# Patient Record
Sex: Female | Born: 1950 | Race: White | Hispanic: No | Marital: Single | Smoking: Never smoker
Health system: Southern US, Community
[De-identification: ages and names within clinical notes are randomized; demographics above are authoritative.]

## PROBLEM LIST (undated history)

## (undated) DIAGNOSIS — R112 Nausea with vomiting, unspecified: Secondary | ICD-10-CM

## (undated) DIAGNOSIS — E119 Type 2 diabetes mellitus without complications: Secondary | ICD-10-CM

## (undated) DIAGNOSIS — J189 Pneumonia, unspecified organism: Secondary | ICD-10-CM

## (undated) DIAGNOSIS — T7840XA Allergy, unspecified, initial encounter: Secondary | ICD-10-CM

## (undated) DIAGNOSIS — L409 Psoriasis, unspecified: Secondary | ICD-10-CM

## (undated) DIAGNOSIS — T8859XA Other complications of anesthesia, initial encounter: Secondary | ICD-10-CM

## (undated) DIAGNOSIS — I10 Essential (primary) hypertension: Secondary | ICD-10-CM

## (undated) DIAGNOSIS — G4730 Sleep apnea, unspecified: Secondary | ICD-10-CM

## (undated) DIAGNOSIS — E079 Disorder of thyroid, unspecified: Secondary | ICD-10-CM

## (undated) DIAGNOSIS — M1990 Unspecified osteoarthritis, unspecified site: Secondary | ICD-10-CM

## (undated) DIAGNOSIS — E669 Obesity, unspecified: Secondary | ICD-10-CM

## (undated) DIAGNOSIS — Z8669 Personal history of other diseases of the nervous system and sense organs: Secondary | ICD-10-CM

## (undated) DIAGNOSIS — G43909 Migraine, unspecified, not intractable, without status migrainosus: Secondary | ICD-10-CM

## (undated) DIAGNOSIS — G8929 Other chronic pain: Secondary | ICD-10-CM

## (undated) DIAGNOSIS — F32A Depression, unspecified: Secondary | ICD-10-CM

## (undated) DIAGNOSIS — F329 Major depressive disorder, single episode, unspecified: Secondary | ICD-10-CM

## (undated) DIAGNOSIS — E039 Hypothyroidism, unspecified: Secondary | ICD-10-CM

## (undated) HISTORY — PX: CARPAL TUNNEL RELEASE: SHX101

## (undated) HISTORY — DX: Depression, unspecified: F32.A

## (undated) HISTORY — DX: Allergy, unspecified, initial encounter: T78.40XA

## (undated) HISTORY — PX: CERVICAL FUSION: SHX112

## (undated) HISTORY — PX: FRACTURE SURGERY: SHX138

## (undated) HISTORY — DX: Disorder of thyroid, unspecified: E07.9

## (undated) HISTORY — PX: COLONOSCOPY: SHX174

## (undated) HISTORY — PX: UPPER GI ENDOSCOPY: SHX6162

## (undated) HISTORY — DX: Essential (primary) hypertension: I10

## (undated) HISTORY — PX: ABDOMINAL HYSTERECTOMY: SHX81

## (undated) HISTORY — DX: Major depressive disorder, single episode, unspecified: F32.9

---

## 1997-12-20 ENCOUNTER — Ambulatory Visit (HOSPITAL_COMMUNITY): Admission: RE | Admit: 1997-12-20 | Discharge: 1997-12-20 | Payer: Self-pay | Admitting: Obstetrics and Gynecology

## 1998-05-21 ENCOUNTER — Inpatient Hospital Stay (HOSPITAL_COMMUNITY): Admission: RE | Admit: 1998-05-21 | Discharge: 1998-05-22 | Payer: Self-pay | Admitting: Neurological Surgery

## 1998-05-21 ENCOUNTER — Encounter: Payer: Self-pay | Admitting: Neurological Surgery

## 1998-07-15 ENCOUNTER — Other Ambulatory Visit: Admission: RE | Admit: 1998-07-15 | Discharge: 1998-07-15 | Payer: Self-pay | Admitting: Obstetrics & Gynecology

## 1998-11-29 ENCOUNTER — Encounter: Admission: RE | Admit: 1998-11-29 | Discharge: 1998-12-13 | Payer: Self-pay | Admitting: Neurological Surgery

## 1998-12-01 ENCOUNTER — Encounter: Payer: Self-pay | Admitting: Neurological Surgery

## 1998-12-01 ENCOUNTER — Ambulatory Visit (HOSPITAL_COMMUNITY): Admission: RE | Admit: 1998-12-01 | Discharge: 1998-12-01 | Payer: Self-pay | Admitting: Neurological Surgery

## 1999-01-04 ENCOUNTER — Inpatient Hospital Stay (HOSPITAL_COMMUNITY): Admission: RE | Admit: 1999-01-04 | Discharge: 1999-01-09 | Payer: Self-pay | Admitting: Orthopedic Surgery

## 1999-01-06 ENCOUNTER — Encounter: Payer: Self-pay | Admitting: Orthopedic Surgery

## 1999-01-08 ENCOUNTER — Encounter: Payer: Self-pay | Admitting: Orthopedic Surgery

## 1999-09-23 ENCOUNTER — Ambulatory Visit (HOSPITAL_BASED_OUTPATIENT_CLINIC_OR_DEPARTMENT_OTHER): Admission: RE | Admit: 1999-09-23 | Discharge: 1999-09-23 | Payer: Self-pay | Admitting: Orthopedic Surgery

## 1999-09-30 ENCOUNTER — Other Ambulatory Visit: Admission: RE | Admit: 1999-09-30 | Discharge: 1999-09-30 | Payer: Self-pay | Admitting: Obstetrics & Gynecology

## 1999-10-02 ENCOUNTER — Encounter: Admission: RE | Admit: 1999-10-02 | Discharge: 1999-11-26 | Payer: Self-pay | Admitting: Orthopedic Surgery

## 2000-01-08 ENCOUNTER — Encounter: Admission: RE | Admit: 2000-01-08 | Discharge: 2000-01-08 | Payer: Self-pay | Admitting: Obstetrics & Gynecology

## 2000-01-08 ENCOUNTER — Encounter: Payer: Self-pay | Admitting: Obstetrics & Gynecology

## 2000-09-06 ENCOUNTER — Ambulatory Visit (HOSPITAL_BASED_OUTPATIENT_CLINIC_OR_DEPARTMENT_OTHER): Admission: RE | Admit: 2000-09-06 | Discharge: 2000-09-06 | Payer: Self-pay | Admitting: Orthopedic Surgery

## 2000-11-11 ENCOUNTER — Encounter: Admission: RE | Admit: 2000-11-11 | Discharge: 2000-11-11 | Payer: Self-pay | Admitting: Neurological Surgery

## 2000-11-11 ENCOUNTER — Encounter: Payer: Self-pay | Admitting: Neurological Surgery

## 2001-07-08 ENCOUNTER — Encounter: Admission: RE | Admit: 2001-07-08 | Discharge: 2001-07-08 | Payer: Self-pay | Admitting: Neurological Surgery

## 2001-07-08 ENCOUNTER — Encounter: Payer: Self-pay | Admitting: Neurological Surgery

## 2001-07-11 ENCOUNTER — Encounter: Admission: RE | Admit: 2001-07-11 | Discharge: 2001-10-09 | Payer: Self-pay | Admitting: Neurological Surgery

## 2002-03-15 ENCOUNTER — Encounter: Admission: RE | Admit: 2002-03-15 | Discharge: 2002-03-15 | Payer: Self-pay | Admitting: Family Medicine

## 2002-05-26 ENCOUNTER — Encounter: Admission: RE | Admit: 2002-05-26 | Discharge: 2002-05-26 | Payer: Self-pay | Admitting: Obstetrics and Gynecology

## 2002-05-26 ENCOUNTER — Encounter: Payer: Self-pay | Admitting: Obstetrics and Gynecology

## 2002-06-22 ENCOUNTER — Ambulatory Visit (HOSPITAL_BASED_OUTPATIENT_CLINIC_OR_DEPARTMENT_OTHER): Admission: RE | Admit: 2002-06-22 | Discharge: 2002-06-22 | Payer: Self-pay | Admitting: Internal Medicine

## 2002-11-03 ENCOUNTER — Other Ambulatory Visit: Admission: RE | Admit: 2002-11-03 | Discharge: 2002-11-03 | Payer: Self-pay | Admitting: Internal Medicine

## 2003-11-26 ENCOUNTER — Encounter: Admission: RE | Admit: 2003-11-26 | Discharge: 2003-11-26 | Payer: Self-pay | Admitting: Obstetrics & Gynecology

## 2004-07-23 ENCOUNTER — Ambulatory Visit: Payer: Self-pay | Admitting: Internal Medicine

## 2004-08-08 ENCOUNTER — Ambulatory Visit: Payer: Self-pay | Admitting: Internal Medicine

## 2004-09-02 ENCOUNTER — Encounter: Admission: RE | Admit: 2004-09-02 | Discharge: 2004-09-02 | Payer: Self-pay | Admitting: Obstetrics and Gynecology

## 2004-11-03 ENCOUNTER — Ambulatory Visit: Payer: Self-pay | Admitting: Internal Medicine

## 2005-07-17 ENCOUNTER — Encounter: Admission: RE | Admit: 2005-07-17 | Discharge: 2005-07-17 | Payer: Self-pay | Admitting: Obstetrics and Gynecology

## 2005-08-03 ENCOUNTER — Encounter: Admission: RE | Admit: 2005-08-03 | Discharge: 2005-08-03 | Payer: Self-pay | Admitting: Obstetrics and Gynecology

## 2005-10-05 ENCOUNTER — Ambulatory Visit: Payer: Self-pay | Admitting: Internal Medicine

## 2005-10-09 ENCOUNTER — Ambulatory Visit: Payer: Self-pay | Admitting: Internal Medicine

## 2005-10-26 ENCOUNTER — Ambulatory Visit: Payer: Self-pay | Admitting: Internal Medicine

## 2005-11-30 ENCOUNTER — Ambulatory Visit: Payer: Self-pay | Admitting: Internal Medicine

## 2005-12-07 ENCOUNTER — Ambulatory Visit: Payer: Self-pay | Admitting: Internal Medicine

## 2006-05-10 ENCOUNTER — Ambulatory Visit: Payer: Self-pay | Admitting: Internal Medicine

## 2006-05-17 ENCOUNTER — Ambulatory Visit: Payer: Self-pay | Admitting: Internal Medicine

## 2006-08-16 ENCOUNTER — Ambulatory Visit: Payer: Self-pay | Admitting: Internal Medicine

## 2006-08-16 LAB — CONVERTED CEMR LAB: TSH: 0.31 microintl units/mL — ABNORMAL LOW (ref 0.35–5.50)

## 2006-09-28 ENCOUNTER — Encounter: Admission: RE | Admit: 2006-09-28 | Discharge: 2006-09-28 | Payer: Self-pay | Admitting: Obstetrics and Gynecology

## 2006-10-11 ENCOUNTER — Ambulatory Visit: Payer: Self-pay | Admitting: Internal Medicine

## 2007-05-11 DIAGNOSIS — J45909 Unspecified asthma, uncomplicated: Secondary | ICD-10-CM | POA: Insufficient documentation

## 2007-05-11 DIAGNOSIS — E669 Obesity, unspecified: Secondary | ICD-10-CM | POA: Insufficient documentation

## 2007-05-11 DIAGNOSIS — R32 Unspecified urinary incontinence: Secondary | ICD-10-CM | POA: Insufficient documentation

## 2007-05-11 DIAGNOSIS — F3289 Other specified depressive episodes: Secondary | ICD-10-CM | POA: Insufficient documentation

## 2007-05-11 DIAGNOSIS — E1169 Type 2 diabetes mellitus with other specified complication: Secondary | ICD-10-CM | POA: Insufficient documentation

## 2007-05-11 DIAGNOSIS — G43909 Migraine, unspecified, not intractable, without status migrainosus: Secondary | ICD-10-CM | POA: Insufficient documentation

## 2007-05-11 DIAGNOSIS — E785 Hyperlipidemia, unspecified: Secondary | ICD-10-CM | POA: Insufficient documentation

## 2007-05-11 DIAGNOSIS — F329 Major depressive disorder, single episode, unspecified: Secondary | ICD-10-CM | POA: Insufficient documentation

## 2008-04-11 ENCOUNTER — Encounter: Payer: Self-pay | Admitting: Internal Medicine

## 2009-07-10 ENCOUNTER — Encounter (INDEPENDENT_AMBULATORY_CARE_PROVIDER_SITE_OTHER): Payer: Self-pay | Admitting: *Deleted

## 2009-09-09 ENCOUNTER — Encounter (INDEPENDENT_AMBULATORY_CARE_PROVIDER_SITE_OTHER): Payer: Self-pay | Admitting: *Deleted

## 2010-05-16 ENCOUNTER — Telehealth: Payer: Self-pay | Admitting: Internal Medicine

## 2010-09-30 NOTE — Progress Notes (Signed)
Summary: Schedule Colonoscopy   Phone Note Outgoing Call Call back at [facility] Phone [phone]   Call placed by: Harlow Mares CMA Duncan Dull),  May 16, 2010 2:06 PM Call placed to: Patient Summary of Call: Left a message on patients machine to call back, patient is due for a colonoscopy due to history of polyps. Initial call taken by: Harlow Mares CMA Duncan Dull),  May 16, 2010 2:06 PM  Follow-up for Phone Call        patients husband says the pt got the first message and he does not know when she is going to call back.  Follow-up by: Harlow Mares CMA Duncan Dull),  May 26, 2010 2:48 PM

## 2010-09-30 NOTE — Consult Note (Signed)
Summary: [facility]  [facility] [place]   Imported By: Maryln Gottron 05/04/2008 15:16:38  _____________________________________________________________________  External Attachment:    Type:   Image     Comment:   External Document

## 2010-09-30 NOTE — Letter (Signed)
Summary: [facility]  [facility] [employer] [facility] [place] Provider: This provider was preselected by the workflow.  Signature: The signature status of this document was preset by the workflow  Processed by [employer] Local Indexer Client @ Thursday, July 18, 2009 3:36:31 PM using version:2010.1.2.11(2.4)   Manually Indexed By: [id number]  idlBatchDetail: [id number]   _____________________________________________________________________  External Attachment:    Type:   Image     Comment:   External Document

## 2010-09-30 NOTE — Letter (Signed)
Summary: Colonoscopy Letter  [facility]  [address] [place], [place] [address]   Phone: [phone]  Fax: [phone]      September 09, 2009 MRN: [id number]   EMONNI DEPASQUALE [address] [place], [place]  [address]   Dear Ms. Travaglini,   According to your medical record, it is time for you to schedule a Colonoscopy. The American Cancer Society recommends this procedure as a method to detect early colon cancer. Patients with a family history of colon cancer, or a personal history of colon polyps or inflammatory bowel disease are at increased risk.  This letter has beeen generated based on the recommendations made at the time of your procedure. If you feel that in your particular situation this may no longer apply, please contact our office.  Please call our office at [phone] to schedule this appointment or to update your records at your earliest convenience.  Thank you for cooperating with [place] to provide you with the very best care possible.   Sincerely,  Hedwig Morton. Juanda Chance, M.D.  [facility] Gastroenterology Division [phone]

## 2013-01-27 ENCOUNTER — Encounter: Payer: Self-pay | Admitting: Internal Medicine

## 2013-01-30 ENCOUNTER — Encounter: Payer: Self-pay | Admitting: Gastroenterology

## 2013-01-30 ENCOUNTER — Encounter: Payer: Self-pay | Admitting: Internal Medicine

## 2015-07-08 DIAGNOSIS — F411 Generalized anxiety disorder: Secondary | ICD-10-CM | POA: Diagnosis not present

## 2015-07-08 DIAGNOSIS — I10 Essential (primary) hypertension: Secondary | ICD-10-CM | POA: Diagnosis not present

## 2015-07-08 DIAGNOSIS — H6121 Impacted cerumen, right ear: Secondary | ICD-10-CM | POA: Diagnosis not present

## 2015-07-08 DIAGNOSIS — Z23 Encounter for immunization: Secondary | ICD-10-CM | POA: Diagnosis not present

## 2015-07-08 DIAGNOSIS — T7840XD Allergy, unspecified, subsequent encounter: Secondary | ICD-10-CM | POA: Diagnosis not present

## 2015-07-08 DIAGNOSIS — I493 Ventricular premature depolarization: Secondary | ICD-10-CM | POA: Diagnosis not present

## 2015-07-11 ENCOUNTER — Ambulatory Visit (INDEPENDENT_AMBULATORY_CARE_PROVIDER_SITE_OTHER): Payer: Medicare Other | Admitting: Family Medicine

## 2015-07-11 DIAGNOSIS — S161XXA Strain of muscle, fascia and tendon at neck level, initial encounter: Secondary | ICD-10-CM | POA: Diagnosis not present

## 2015-07-11 DIAGNOSIS — M25511 Pain in right shoulder: Secondary | ICD-10-CM

## 2015-07-11 MED ORDER — METHOCARBAMOL 500 MG PO TABS: ORAL_TABLET | ORAL | Status: DC
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
## 2015-07-11 NOTE — Patient Instructions (Signed)
Take Robaxin (methocarbamol) 500 mg one 3 times daily with meals and 2 at bedtime on an as-needed basis. Muscle relaxant  Take naproxen (Aleve) 220 mg 2 pills twice daily for pain and inflammation or ibuprofen 200 mg 4 pills 3 times daily as needed for pain and inflammation  Return as needed  Apply ice as you have been to the affected areas  Stay off work tomorrow and rest

## 2015-08-15 DIAGNOSIS — Z6833 Body mass index (BMI) 33.0-33.9, adult: Secondary | ICD-10-CM | POA: Diagnosis not present

## 2015-08-15 DIAGNOSIS — I10 Essential (primary) hypertension: Secondary | ICD-10-CM | POA: Diagnosis not present

## 2015-08-19 DIAGNOSIS — Z124 Encounter for screening for malignant neoplasm of cervix: Secondary | ICD-10-CM | POA: Diagnosis not present

## 2015-08-19 DIAGNOSIS — Z779 Other contact with and (suspected) exposures hazardous to health: Secondary | ICD-10-CM | POA: Diagnosis not present

## 2015-08-19 DIAGNOSIS — Z6834 Body mass index (BMI) 34.0-34.9, adult: Secondary | ICD-10-CM | POA: Diagnosis not present

## 2015-09-09 DIAGNOSIS — E039 Hypothyroidism, unspecified: Secondary | ICD-10-CM | POA: Diagnosis not present

## 2015-09-09 DIAGNOSIS — I10 Essential (primary) hypertension: Secondary | ICD-10-CM | POA: Diagnosis not present

## 2015-09-09 DIAGNOSIS — F411 Generalized anxiety disorder: Secondary | ICD-10-CM | POA: Diagnosis not present

## 2015-09-20 ENCOUNTER — Other Ambulatory Visit: Payer: Self-pay | Admitting: Nurse Practitioner

## 2015-09-20 DIAGNOSIS — N6320 Unspecified lump in the left breast, unspecified quadrant: Secondary | ICD-10-CM

## 2015-09-20 DIAGNOSIS — Z13820 Encounter for screening for osteoporosis: Secondary | ICD-10-CM | POA: Diagnosis not present

## 2015-09-20 DIAGNOSIS — N958 Other specified menopausal and perimenopausal disorders: Secondary | ICD-10-CM | POA: Diagnosis not present

## 2015-09-22 DIAGNOSIS — J209 Acute bronchitis, unspecified: Secondary | ICD-10-CM | POA: Diagnosis not present

## 2015-09-24 ENCOUNTER — Telehealth: Payer: Self-pay | Admitting: Internal Medicine

## 2015-09-24 ENCOUNTER — Other Ambulatory Visit: Payer: Self-pay | Admitting: Family Medicine

## 2015-09-24 ENCOUNTER — Ambulatory Visit
Admission: RE | Admit: 2015-09-24 | Discharge: 2015-09-24 | Disposition: A | Discharge status: Home / Self Care | Payer: Medicare Other | Source: Ambulatory Visit | Attending: Family Medicine | Admitting: Family Medicine

## 2015-09-24 DIAGNOSIS — J45909 Unspecified asthma, uncomplicated: Secondary | ICD-10-CM

## 2015-09-24 DIAGNOSIS — J069 Acute upper respiratory infection, unspecified: Secondary | ICD-10-CM | POA: Diagnosis not present

## 2015-09-24 DIAGNOSIS — J45901 Unspecified asthma with (acute) exacerbation: Secondary | ICD-10-CM | POA: Diagnosis not present

## 2015-09-24 DIAGNOSIS — R0602 Shortness of breath: Secondary | ICD-10-CM

## 2015-09-24 DIAGNOSIS — R05 Cough: Secondary | ICD-10-CM | POA: Diagnosis not present

## 2015-09-24 NOTE — Telephone Encounter (Signed)
Spoke with pt. States that she thinks she is getting bronchitis. We have not seen this pt since 2007. Advised her that since we have not seen her in 10 years that she should contact her PCP about this issue. She agreed and verbalized understanding. She did request for [place] to schedule an appointment to re-establish care. This has been scheduled with MW on 10/15/15 at 4pm. Nothing further was needed.

## 2015-10-01 DIAGNOSIS — T7840XD Allergy, unspecified, subsequent encounter: Secondary | ICD-10-CM | POA: Diagnosis not present

## 2015-10-01 DIAGNOSIS — J45901 Unspecified asthma with (acute) exacerbation: Secondary | ICD-10-CM | POA: Diagnosis not present

## 2015-10-01 DIAGNOSIS — E039 Hypothyroidism, unspecified: Secondary | ICD-10-CM | POA: Diagnosis not present

## 2015-10-04 ENCOUNTER — Ambulatory Visit
Admission: RE | Admit: 2015-10-04 | Discharge: 2015-10-04 | Disposition: A | Discharge status: Home / Self Care | Payer: Medicare Other | Source: Ambulatory Visit | Attending: Nurse Practitioner | Admitting: Nurse Practitioner

## 2015-10-04 DIAGNOSIS — N6320 Unspecified lump in the left breast, unspecified quadrant: Secondary | ICD-10-CM

## 2015-10-04 DIAGNOSIS — N644 Mastodynia: Secondary | ICD-10-CM | POA: Diagnosis not present

## 2015-10-15 ENCOUNTER — Ambulatory Visit (INDEPENDENT_AMBULATORY_CARE_PROVIDER_SITE_OTHER): Payer: Medicare Other | Admitting: Internal Medicine

## 2015-10-15 ENCOUNTER — Encounter: Payer: Self-pay | Admitting: Internal Medicine

## 2015-10-15 VITALS — BP 122/72 | HR 80 | Ht 65.0 in | Wt 210.4 lb

## 2015-10-15 DIAGNOSIS — I10 Essential (primary) hypertension: Secondary | ICD-10-CM | POA: Diagnosis not present

## 2015-10-15 DIAGNOSIS — J45991 Cough variant asthma: Secondary | ICD-10-CM | POA: Diagnosis not present

## 2015-10-15 LAB — NITRIC OXIDE: n-Acetyl Procainamide: 47

## 2015-10-15 MED ORDER — PANTOPRAZOLE SODIUM 40 MG PO TBEC: 40.0000 mg | DELAYED_RELEASE_TABLET | Freq: Every day | ORAL | Status: DC

## 2015-10-15 MED ORDER — FAMOTIDINE 20 MG PO TABS: ORAL_TABLET | ORAL | Status: DC

## 2015-10-15 NOTE — Patient Instructions (Addendum)
GERD (REFLUX)  is an extremely common cause of respiratory symptoms just like yours , many times with no obvious heartburn at all.    It can be treated with medication, but also with lifestyle changes including elevation of the head of your bed (ideally with 6 inch  bed blocks),  Smoking cessation, avoidance of late meals, excessive alcohol, and avoid fatty foods, chocolate, peppermint, colas, red wine, and acidic juices such as orange juice.  NO MINT OR MENTHOL PRODUCTS SO NO COUGH DROPS  USE SUGARLESS CANDY INSTEAD (Jolley ranchers or Stover's or Life Savers) or even ice chips will also do - the key is to swallow to prevent all throat clearing. NO OIL BASED VITAMINS - use powdered substitutes.    Pantoprazole (protonix) 40 mg   Take  30-60 min before first meal of the day and Pepcid (famotidine)  20 mg one @  bedtime until return to office - this is the best way to tell whether stomach acid is contributing to your problem.    Only use your albuterol as a rescue medication to be used if you can't catch your breath by resting or doing a relaxed purse lip breathing pattern.  - The less you use it, the better it will work when you need it. - Ok to use up to 2 puffs  every 4 hours if you must but call for immediate appointment if use goes up over your usual need - Don't leave home without it !!  (think of it like the spare tire for your car)    Try off metaprolol  and on Bystolic 5 mg daily in its place x 2 weeks only then try off to see if makes a difference on vs off   If not able to eliminate need for ventolin after 4 weeks of reflux treatment then start dulera 100 2 every 12 hours or call me to schedule methacholine test next

## 2015-10-15 NOTE — Progress Notes (Signed)
Subjective:    Patient ID: Caitlin Stevenson, female    DOB: 10-12-1950,     MRN: [id number]  HPI  4 yowf  PA, widow of Lenice Pressman MD never regular smoker but lots of second hand smoke / sob assoc with  URI's as child some better  20-40 but then in her 96's noted  onset of intermittent noct wheeze seemed better p short course of prednisone but gradually more freq wheezing esp p furnacecomes on each winter   so self referred to pulmonary clinic 10/15/2015    10/15/2015 1st [facility] office visit/ [facility]   Chief Complaint  Patient presents with  . pulmonary consult    self ref for asthma. pt c/o occ. SOB. occ. wheezing. occ. prod. cough clear in color. denies chest pain/tightness.  Sharma eval at [facility] pos for grass trees pets (she has dog in [place]) shots x one year no better ENT patsevoras surgery  for dev septum Ventolin twice daily since the fall and twice rx with prednisone did not eliminate symptoms or need for saba   Also choking sensation frequently daytime with voice use singulair helped each aches  Noct wheeze most nights, variably disturbs sleep   No obvious other patterns in day to day or daytime variabilty or assoc excess/ purulent sputum or mucus plugs  or cp or chest tightness, subjective wheeze overt sinus or hb symptoms. No unusual exp hx or h/o childhood pna/ asthma or knowledge of premature birth.  Also denies any obvious fluctuation of symptoms with weather or environmental changes or other aggravating or alleviating factors except as outlined above   Current Medications, Allergies, Complete Past Medical History, Past Surgical History, Family History, and Social History were reviewed in [employer] record.             Review of Systems  Constitutional: Negative for fever and unexpected weight change.  HENT: Negative for congestion, dental problem, ear pain, nosebleeds, postnasal drip, rhinorrhea, sinus pressure, sneezing, sore throat  and trouble swallowing.   Eyes: Negative for redness and itching.  Respiratory: Positive for chest tightness, shortness of breath and wheezing. Negative for cough.   Cardiovascular: Negative for palpitations and leg swelling.  Gastrointestinal: Negative for nausea and vomiting.  Genitourinary: Negative for dysuria.  Musculoskeletal: Negative for joint swelling.  Skin: Negative for rash.  Neurological: Negative for headaches.  Hematological: Does not bruise/bleed easily.  Psychiatric/Behavioral: Negative for dysphoric mood. The patient is nervous/anxious.        Objective:   Physical Exam  amb wf nad  Wt Readings from Last 3 Encounters:  10/15/15 210 lb 6.4 oz (95.437 kg)  07/11/15 210 lb (95.255 kg)    Vital signs reviewed   HEENT: nl dentition, turbinates, and oropharynx. Nl external ear canals without cough reflex   NECK :  without JVD/Nodes/TM/ nl carotid upstrokes bilaterally   LUNGS: no acc muscle use,  Nl contour chest which is clear to A and P bilaterally without cough on insp or exp maneuvers   CV:  RRR  no s3 or murmur or increase in P2, no edema   ABD:  soft and nontender with nl inspiratory excursion in the supine position. No bruits or organomegaly, bowel sounds nl  MS:  Nl gait/ ext warm without deformities, calf tenderness, cyanosis or clubbing No obvious joint restrictions   SKIN: warm and dry without lesions    NEURO:  alert, approp, nl sensorium with  no motor deficits   CXR  PA and Lateral:   10/15/2015 :    I personally reviewed images and agree with radiology impression as follows:   Increased interstitial markings likely reflect known reactive airway disease. There is no alveolar pneumonia, CHF, nor other acute cardiopulmonary disease. There is a moderate-sized hiatal hernia.           Assessment & Plan:

## 2015-10-16 DIAGNOSIS — I10 Essential (primary) hypertension: Secondary | ICD-10-CM | POA: Insufficient documentation

## 2015-10-16 NOTE — Assessment & Plan Note (Signed)
Strongly prefer in this setting: Bystolic, the most beta -1  selective Beta blocker available in sample form, with bisoprolol the most selective generic choice  on the market.   Try bystolic 5 mg samples daily x 2 weeks min to see what difference if any this makes in her symptoms and if not ok to resume prev BB

## 2015-10-16 NOTE — Assessment & Plan Note (Addendum)
NO 10/15/2015  = 50 - Spirometry 10/15/2015 wnl including fef25-75 with last saba > 8 h prior  Whether this is asthma or uacs the symptoms are chronic and do not reverse with prednisone so by definition are difficult to control either way  DDX of  difficult airways management almost all start with A and  include Adherence, Ace Inhibitors, Acid Reflux, Active Sinus Disease, Alpha 1 Antitripsin deficiency, Anxiety masquerading as Airways dz,  ABPA,  Allergy(esp in young), Aspiration (esp in elderly), Adverse effects of meds,  Active smokers, A bunch of PE's (a small clot burden can't cause this syndrome unless there is already severe underlying pulm or vascular dz with poor reserve) plus two Bs  = Bronchiectasis and Beta blocker use..and one C= CHF  Adherence is always the initial "prime suspect" and is a multilayered concern that requires a "trust but verify" approach in every patient - starting with knowing how to use medications, especially inhalers, correctly, keeping up with refills and understanding the fundamental difference between maintenance and prns vs those medications only taken for a very short course and then stopped and not refilled.  - The proper method of use, as well as anticipated side effects, of a metered-dose inhaler are discussed and demonstrated to the patient. Improved effectiveness after extensive coaching during this visit to a level of approximately 75 % from a baseline of 50 %   ? Acid (or non-acid) GERD > always difficult to exclude as up to 75% of pts in some series report no assoc GI/ Heartburn symptoms and she is obese with a large HH so is at risk > rec max (24h)  acid suppression and diet restrictions/ reviewed and instructions given in writing.   ? Allergy > note minimal improvement with singulair, min resp to steroids but NO =51 is slt elevated so rec add back dulera 100 2 bid if symptoms not well controlled on - for now - max gerd rx and singulair    ? BB effects >  see hbp  I had an extended discussion with the patient reviewing all relevant studies completed to date and  lasting 35 minutes of a 60 minute visit    Each maintenance medication was reviewed in detail including most importantly the difference between maintenance and prns and under what circumstances the prns are to be triggered using an action plan format that is not reflected in the computer generated alphabetically organized AVS.    Please see instructions for details which were reviewed in writing and the patient given a copy highlighting the part that I personally wrote and discussed at today's ov.

## 2015-10-17 ENCOUNTER — Encounter: Payer: Self-pay | Admitting: Internal Medicine

## 2015-10-17 ENCOUNTER — Ambulatory Visit (INDEPENDENT_AMBULATORY_CARE_PROVIDER_SITE_OTHER): Payer: Medicare Other | Admitting: Internal Medicine

## 2015-10-17 VITALS — BP 118/70 | HR 67 | Temp 98.6°F | Resp 14 | Ht 65.0 in | Wt 211.0 lb

## 2015-10-17 DIAGNOSIS — E669 Obesity, unspecified: Secondary | ICD-10-CM

## 2015-10-17 DIAGNOSIS — K439 Ventral hernia without obstruction or gangrene: Secondary | ICD-10-CM | POA: Diagnosis not present

## 2015-10-17 DIAGNOSIS — E119 Type 2 diabetes mellitus without complications: Secondary | ICD-10-CM

## 2015-10-17 DIAGNOSIS — I10 Essential (primary) hypertension: Secondary | ICD-10-CM

## 2015-10-17 DIAGNOSIS — J45991 Cough variant asthma: Secondary | ICD-10-CM

## 2015-10-17 DIAGNOSIS — E1169 Type 2 diabetes mellitus with other specified complication: Secondary | ICD-10-CM

## 2015-10-17 MED ORDER — HALOBETASOL PROPIONATE 0.05 % EX OINT: TOPICAL_OINTMENT | Freq: Two times a day (BID) | CUTANEOUS | Status: DC

## 2015-10-17 MED ORDER — SUMATRIPTAN SUCCINATE 50 MG PO TABS: 50.0000 mg | ORAL_TABLET | ORAL | Status: DC | PRN

## 2015-10-17 NOTE — Patient Instructions (Signed)
We have sent in the prescriptions that you needed and if you have any problems feel free to call the office.   Send [place] a copy of your labs etc so we can update your chart.   Come back in about 6-12 months or call sooner if you have any problems.   Hiatal Hernia A hiatal hernia occurs when part of your stomach slides above the muscle that separates your abdomen from your chest (diaphragm). You can be born with a hiatal hernia (congenital), or it may develop over time. In almost all cases of hiatal hernia, only the top part of the stomach pushes through.  Many people have a hiatal hernia with no symptoms. The larger the hernia, the more likely that you will have symptoms. In some cases, a hiatal hernia allows stomach acid to flow back into the tube that carries food from your mouth to your stomach (esophagus). This may cause heartburn symptoms. Severe heartburn symptoms may mean you have developed a condition called gastroesophageal reflux disease (GERD).  CAUSES  Hiatal hernias are caused by a weakness in the opening (hiatus) where your esophagus passes through your diaphragm to attach to the upper part of your stomach. You may be born with a weakness in your hiatus, or a weakness can develop. RISK FACTORS Older age is a major risk factor for a hiatal hernia. Anything that increases pressure on your diaphragm can also increase your risk of a hiatal hernia. This includes:  Pregnancy.  Excess weight.  Frequent constipation. SIGNS AND SYMPTOMS  People with a hiatal hernia often have no symptoms. If symptoms develop, they are almost always caused by GERD. They may include:  Heartburn.  Belching.  Indigestion.  Trouble swallowing.  Coughing or wheezing.  Sore throat.  Hoarseness.  Chest pain. DIAGNOSIS  A hiatal hernia is sometimes found during an exam for another problem. Your health care provider may suspect a hiatal hernia if you have symptoms of GERD. Tests may be done to  diagnose GERD. These may include:  X-rays of your stomach or chest.  An upper gastrointestinal (GI) series. This is an X-ray exam of your GI tract involving the use of a chalky liquid that you swallow. The liquid shows up clearly on the X-ray.  Endoscopy. This is a procedure to look into your stomach using a thin, flexible tube that has a tiny camera and light on the end of it. TREATMENT  If you have no symptoms, you may not need treatment. If you have symptoms, treatment may include:  Dietary and lifestyle changes to help reduce GERD symptoms.  Medicines. These may include:  Over-the-counter antacids.  Medicines that make your stomach empty more quickly.  Medicines that block the production of stomach acid (H2 blockers).  Stronger medicines to reduce stomach acid (proton pump inhibitors).  You may need surgery to repair the hernia if other treatments are not helping. HOME CARE INSTRUCTIONS   Take all medicines as directed by your health care provider.  Quit smoking, if you smoke.  Try to achieve and maintain a healthy body weight.  Eat frequent small meals instead of three large meals a day. This keeps your stomach from getting too full.  Eat slowly.  Do not lie down right after eating.  Do noteat 1-2 hours before bed.   Do not drink beverages with caffeine. These include cola, coffee, cocoa, and tea.  Do not drink alcohol.  Avoid foods that can make symptoms of GERD worse. These may include:  Fatty foods.  Citrus fruits.  Other foods and drinks that contain acid.  Avoid putting pressure on your belly. Anything that puts pressure on your belly increases the amount of acid that may be pushed up into your esophagus.   Avoid bending over, especially after eating.  Raise the head of your bed by putting blocks under the legs. This keeps your head and esophagus higher than your stomach.  Do not wear tight clothing around your chest or stomach.  Try not to  strain when having a bowel movement, when urinating, or when lifting heavy objects. SEEK MEDICAL CARE IF:  Your symptoms are not controlled with medicines or lifestyle changes.  You are having trouble swallowing.  You have coughing or wheezing that will not go away. SEEK IMMEDIATE MEDICAL CARE IF:  Your pain is getting worse.  Your pain spreads to your arms, neck, jaw, teeth, or back.  You have shortness of breath.  You sweat for no reason.  You feel sick to your stomach (nauseous) or vomit.  You vomit blood.  You have bright red blood in your stools.  You have black, tarry stools.    This information is not intended to replace advice given to you by your health care provider. Make sure you discuss any questions you have with your health care provider.   Document Released: 11/07/2003 Document Revised: 09/07/2014 Document Reviewed: 08/04/2013 Elsevier Interactive Patient Education [employer].

## 2015-10-17 NOTE — Progress Notes (Signed)
Pre visit review using our clinic review tool, if applicable. No additional management support is needed unless otherwise documented below in the visit note. 

## 2015-10-20 ENCOUNTER — Encounter: Payer: Self-pay | Admitting: Internal Medicine

## 2015-10-20 DIAGNOSIS — K439 Ventral hernia without obstruction or gangrene: Secondary | ICD-10-CM | POA: Insufficient documentation

## 2015-10-20 NOTE — Assessment & Plan Note (Signed)
Discussed the etiology of the hiatal hernia and why it could be causing or worsening her acid reflux which is leading to the cough and reminded her that proper treatment of acid can take 2-3 months for cough to improve so she should give this an adequate amount of time.

## 2015-10-20 NOTE — Assessment & Plan Note (Signed)
On atenolol, olmesartan, hctz with BP at goal. No adjustment today and recent labs will obtain and review from records.

## 2015-10-20 NOTE — Progress Notes (Signed)
   Subjective:    Patient ID: Caitlin Stevenson, female    DOB: Feb 19, 1951, 65 y.o.   MRN: [id number]  HPI The patient is a new 65 YO female coming in with questions about her hiatal and ventral hernia. She is having severe cough which she saw pulmonary about. They talked to her about acid reflux due to her hernia. She thought they meant her ventral hernia which is growing over time. 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 Never gets stuck and not that painful (minimal discomfort sometimes). She is still having the cough and just started taking the acid medicine daily. She is also having some mild drainage which is marginally better since adjustment of medications. No nausea or vomiting. No blood in her stools or black stools.   PMH, [facility], social history reviewed and updated.   Review of Systems  Constitutional: Negative for fever, chills, activity change, appetite change, fatigue and unexpected weight change.  HENT: Positive for congestion and postnasal drip. Negative for nosebleeds, rhinorrhea and sinus pressure.   Eyes: Negative.   Respiratory: Positive for cough. Negative for chest tightness, shortness of breath and wheezing.   Cardiovascular: Negative for chest pain, palpitations and leg swelling.  Gastrointestinal: Negative for nausea, vomiting, abdominal pain, diarrhea and abdominal distention.  Musculoskeletal: Negative.   Skin: Negative.   Neurological: Negative.   Psychiatric/Behavioral: Negative.       Objective:   Physical Exam  Constitutional: She is oriented to person, place, and time. She appears well-developed and well-nourished.  Overweight  HENT:  Head: Normocephalic and atraumatic.  Eyes: EOM are normal.  Neck: Normal range of motion.  Cardiovascular: Normal rate and regular rhythm.   Pulmonary/Chest: Effort normal and breath sounds normal. No respiratory distress. She has no wheezes.  She has no rales.  Abdominal: Soft. Bowel sounds are normal. She exhibits no distension. There is no tenderness. There is no rebound.  Medium sized ventral hernia, easily reducible  Musculoskeletal: She exhibits no edema.  Neurological: She is alert and oriented to person, place, and time. Coordination normal.  Skin: Skin is warm and dry.  Psychiatric: She has a normal mood and affect.   Filed Vitals:   10/17/15 1602  BP: 118/70  Pulse: 67  Temp: 98.6 F (37 C)  TempSrc: Oral  Resp: 14  Height: 5\' 5"  (1.651 m)  Weight: 211 lb (95.709 kg)  SpO2: 98%      Assessment & Plan:

## 2015-10-20 NOTE — Assessment & Plan Note (Signed)
She would not like to get fixed at this time. We talked about the fact that weight loss prior to surgery is helpful in decreasing risk of recurrence after surgery.

## 2015-10-20 NOTE — Assessment & Plan Note (Signed)
Getting records for labs, this is in her history but she is not on any medications.

## 2015-12-11 ENCOUNTER — Ambulatory Visit (INDEPENDENT_AMBULATORY_CARE_PROVIDER_SITE_OTHER)
Admission: RE | Admit: 2015-12-11 | Discharge: 2015-12-11 | Disposition: A | Discharge status: Home / Self Care | Payer: Medicare Other | Source: Ambulatory Visit | Attending: Internal Medicine | Admitting: Internal Medicine

## 2015-12-11 ENCOUNTER — Encounter: Payer: Self-pay | Admitting: Internal Medicine

## 2015-12-11 ENCOUNTER — Ambulatory Visit (INDEPENDENT_AMBULATORY_CARE_PROVIDER_SITE_OTHER): Payer: Medicare Other | Admitting: Internal Medicine

## 2015-12-11 ENCOUNTER — Other Ambulatory Visit: Payer: Medicare Other

## 2015-12-11 VITALS — BP 110/72 | HR 70 | Temp 98.5°F | Ht 65.5 in | Wt 209.0 lb

## 2015-12-11 DIAGNOSIS — R05 Cough: Secondary | ICD-10-CM | POA: Diagnosis not present

## 2015-12-11 DIAGNOSIS — J45991 Cough variant asthma: Secondary | ICD-10-CM

## 2015-12-11 DIAGNOSIS — R0602 Shortness of breath: Secondary | ICD-10-CM | POA: Diagnosis not present

## 2015-12-11 DIAGNOSIS — I10 Essential (primary) hypertension: Secondary | ICD-10-CM | POA: Diagnosis not present

## 2015-12-11 MED ORDER — MOMETASONE FURO-FORMOTEROL FUM 100-5 MCG/ACT IN AERO: INHALATION_SPRAY | RESPIRATORY_TRACT | Status: DC

## 2015-12-11 MED ORDER — PREDNISONE 10 MG PO TABS: ORAL_TABLET | ORAL | Status: DC

## 2015-12-11 MED ORDER — HYDROCODONE-ACETAMINOPHEN 5-325 MG PO TABS: 1.0000 | ORAL_TABLET | ORAL | Status: DC | PRN

## 2015-12-11 MED ORDER — FLUTTER DEVI: Status: DC

## 2015-12-11 NOTE — Patient Instructions (Addendum)
Whenever you cough > cough into the flutter   Prednisone 10 mg take  4 each am x 2 days,   2 each am x 2 days,  1 each am x 2 days and stop   Dulera 100 Take 2 puffs first thing in am and then another 2 puffs about 12 hours later.   Take delsym two tsp every 12 hours and supplement if needed with vicodin  up to 1 every 4 hours to suppress the urge to cough. Swallowing water or using ice chips/non mint and menthol containing candies (such as lifesavers or sugarless jolly ranchers) are also effective.  You should rest your voice and avoid activities that you know make you cough.  Once you have eliminated the cough for 3 straight days try reducing the tramadol first,  then the delsym as tolerated.    Please remember to go to the   x-ray department downstairs for your tests - we will call you with the results when they are available.

## 2015-12-11 NOTE — Progress Notes (Signed)
Subjective:    Patient ID: Caitlin Stevenson, female    DOB: May 06, 1951,     MRN: [id number]    Brief patient profile:  65 yowf  [place], widow of Lenice Pressman MD never regular smoker but lots of second hand smoke / sob assoc with  URI's as child some better age [age] but then in her 65's noted  onset of intermittent noct wheeze seemed better p short course of prednisone but gradually more freq wheezing esp p furnace comes on each winter so self referred to pulmonary clinic 10/15/2015   History of Present Illness  10/15/2015 1st [facility] office visit/ Melannie Metzner   Chief Complaint  Patient presents with  . pulmonary consult    self ref for asthma. pt c/o occ. SOB. occ. wheezing. occ. prod. cough clear in color. denies chest pain/tightness.  Sharma eval at [place] pos for grass trees pets (she has dog in [place]) shots x one year no better ENT patsevoras surgery  for dev septum Ventolin twice daily since the fall and twice rx with prednisone did not eliminate symptoms or need for saba   Also choking sensation frequently daytime with voice use singulair helped ear aches  Noct wheeze most nights, variably disturbs sleep  rec GERD  Diet  Pantoprazole (protonix) 40 mg   Take  30-60 min before first meal of the day and Pepcid (famotidine)  20 mg one @  bedtime until return to office - this is the best way to tell whether stomach acid is contributing to your problem.   Only use your albuterol as a rescue medication  Try off metaprolol  and on Bystolic 5 mg daily in its place x 2 weeks only then try off to see if makes a difference on vs off> did not   If not able to eliminate need for ventolin after 4 weeks of reflux treatment then start dulera 100 2 every 12 hours or call me to schedule methacholine test next    12/11/2015  f/u ov/Johnattan Strassman re: cough ? Etiology  Chief Complaint  Patient presents with  . Acute Visit    Pt c/o cough x 10 days- was prod in the beginning with brown/green sputum, but now  non prod. She states feels like she can not catch her breath- started on pred yesterday by her OB/GYN.   since last ov maint  ppi/ pepcid hs/singulair and now back on tenormin  Even prednisone /dulera did not eliminate noct cough in past  and then cough crescendo'd with flu > no treatment change since onset  Ventolin helps the most though hfa not optimal  No obvious other patterns in day to day or daytime variabilty or assoc  mucus plugs  or cp or chest tightness, subjective wheeze overt sinus or hb symptoms. No unusual exp hx or h/o childhood pna/ asthma or knowledge of premature birth.  Also denies any obvious fluctuation of symptoms with weather or environmental changes or other aggravating or alleviating factors except as outlined above   Current Medications, Allergies, Complete Past Medical History, Past Surgical History, Family History, and Social History were reviewed in [employer] record.          Objective:   Physical Exam  amb wf nad with harsh honking  cough   Wt Readings from Last 3 Encounters:  12/11/15 209 lb (94.802 kg)  10/17/15 211 lb (95.709 kg)  10/15/15 210 lb 6.4 oz (95.437 kg)    Vital signs reviewed  HEENT: nl dentition, turbinates, and oropharynx. Nl external ear canals without cough reflex   NECK :  without JVD/Nodes/TM/ nl carotid upstrokes bilaterally   LUNGS: no acc muscle use,  Nl contour chest with minimal insp / exp rhonchi    CV:  RRR  no s3 or murmur or increase in P2, no edema   ABD:  soft and nontender with nl inspiratory excursion in the supine position. No bruits or organomegaly, bowel sounds nl  MS:  Nl gait/ ext warm without deformities, calf tenderness, cyanosis or clubbing No obvious joint restrictions   SKIN: warm and dry without lesions    NEURO:  alert, approp, nl sensorium with  no motor deficits   CXR PA and Lateral:   12/11/2015 :    I personally reviewed images and agree with radiology impression as  follows:    No infiltrate or pulmonary edema. Stable left basilar atelectasis or scarring. Again noted chronic mild elevation of left posterior hemidiaphragm.           Assessment & Plan:

## 2015-12-12 ENCOUNTER — Telehealth: Payer: Self-pay | Admitting: Internal Medicine

## 2015-12-12 NOTE — Telephone Encounter (Signed)
Notes Recorded by Tanda Rockers, MD on 12/11/2015 at 5:31 PM Call pt: Reviewed cxr and no acute change so no change in recommendations made at [facility] and spoke with the pt. Reviewed results and recs. Pt voiced understanding and had no further questions. Nothing further needed.

## 2015-12-12 NOTE — Assessment & Plan Note (Signed)
Adequate control on present rx, reviewed > no change in rx needed  But ideally  prefer in this setting (? Persistent low grade asthma) : Bystolic, the most beta -1  selective Beta blocker available in sample form, with bisoprolol the most selective generic choice  on the market.  Will consider permanent change to bisoprolol on f/u ov if not improving

## 2015-12-12 NOTE — Assessment & Plan Note (Addendum)
NO 10/15/2015  = 50 - Spirometry 10/15/2015 wnl including fef25-75 with last saba > 8 h prior   - 12/11/2015  After extensive coaching HFA effectiveness =    75% > challenge with dulera 100 2bid  - added flutter valve 12/11/2015   Noct cough never resolved on prev rx but did not include consistent use of dulera so reasonable to rechallenge at this point   I had an extended discussion with the patient reviewing all relevant studies completed to date and  lasting 15 to 20 minutes of a 25 minute visit   Explained the natural history of uri/viral or otherwise and why it's necessary in patients at risk to treat GERD aggressively - at least  short term -   to reduce risk of evolving cyclical cough initially  triggered by epithelial injury and a heightened sensitivty to the effects of any upper airway irritants,  most importantly acid - related - then perpetuated by epithelial injury related to the cough itself as the upper airway collapses on itself.  That is, the more sensitive the epithelium becomes once it is damaged by the virus, the more the ensuing irritability> the more the cough, the more the secondary reflux (especially in those prone to reflux) the more the irritation of the sensitive mucosa and so on in a  Classic cyclical pattern.   Each maintenance medication was reviewed in detail including most importantly the difference between maintenance and prns and under what circumstances the prns are to be triggered using an action plan format that is not reflected in the computer generated alphabetically organized AVS.    Please see instructions for details which were reviewed in writing and the patient given a copy highlighting the part that I personally wrote and discussed at today's ov.

## 2015-12-12 NOTE — Progress Notes (Signed)
Quick Note:  Spoke with pt and notified of results per Dr. Wert. Pt verbalized understanding and denied any questions.  ______ 

## 2015-12-16 ENCOUNTER — Telehealth: Payer: Self-pay | Admitting: Internal Medicine

## 2015-12-16 MED ORDER — PREDNISONE 10 MG PO TABS: ORAL_TABLET | ORAL | Status: DC

## 2015-12-16 NOTE — Telephone Encounter (Signed)
Called and spoke to pt. Informed her of the recs per MW. Pt verbalized understanding and denied any further questions or concerns at this time.   

## 2015-12-16 NOTE — Telephone Encounter (Signed)
Prednisone 10 mg take  4 each am x 2 days,   2 each am x 2 days,  1 each am x 2 days and stop  Ov with all meds in hand by 3/21 if not improving and be sure to keep coughing into the flutter valve

## 2015-12-16 NOTE — Telephone Encounter (Signed)
LMOM TCB x1 Pred taper sent to [facility] - still need to discuss MW's other recs with patient

## 2015-12-16 NOTE — Telephone Encounter (Signed)
Pt returning call.Caitlin Stevenson ° °

## 2015-12-16 NOTE — Telephone Encounter (Signed)
Patient states that she feels the same as last week, still gurgling in her throat.  She just completed the Prednisone and she is still gasping for air.  Wants to know if she needs to stay on the prednisone.  Still having a lot of sinus drainage down into throat.  Mucus gets stuck in throat.   [facility] [address]  Allergies  Allergen Reactions  . Amoxicillin     REACTION: unspecified  . Minocycline Hcl     REACTION: unspecified  . Morphine Sulfate     REACTION: unspecified  . Penicillins    Patient Instructions     Whenever you cough > cough into the flutter   Prednisone 10 mg take 4 each am x 2 days, 2 each am x 2 days, 1 each am x 2 days and stop   Dulera 100 Take 2 puffs first thing in am and then another 2 puffs about 12 hours later.   Take delsym two tsp every 12 hours and supplement if needed with vicodin up to 1 every 4 hours to suppress the urge to cough. Swallowing water or using ice chips/non mint and menthol containing candies (such as lifesavers or sugarless jolly ranchers) are also effective. You should rest your voice and avoid activities that you know make you cough.  Once you have eliminated the cough for 3 straight days try reducing the tramadol first, then the delsym as tolerated.   Please remember to go to the x-ray department downstairs for your tests - we will call you with the results when they are available.

## 2015-12-31 ENCOUNTER — Encounter: Payer: Self-pay | Admitting: Internal Medicine

## 2015-12-31 ENCOUNTER — Ambulatory Visit (INDEPENDENT_AMBULATORY_CARE_PROVIDER_SITE_OTHER): Payer: Medicare Other | Admitting: Internal Medicine

## 2015-12-31 ENCOUNTER — Other Ambulatory Visit: Payer: Self-pay | Admitting: Internal Medicine

## 2015-12-31 VITALS — BP 148/72 | HR 71 | Ht 65.0 in | Wt 212.6 lb

## 2015-12-31 DIAGNOSIS — J45991 Cough variant asthma: Secondary | ICD-10-CM

## 2015-12-31 DIAGNOSIS — I10 Essential (primary) hypertension: Secondary | ICD-10-CM

## 2015-12-31 LAB — NITRIC OXIDE: Nitric Oxide: 21

## 2015-12-31 MED ORDER — PANTOPRAZOLE SODIUM 40 MG PO TBEC
40.0000 mg | DELAYED_RELEASE_TABLET | Freq: Every day | ORAL | Status: DC
Start: 2015-12-31 — End: 2021-10-08

## 2015-12-31 MED ORDER — GABAPENTIN 100 MG PO CAPS: 100.0000 mg | ORAL_CAPSULE | Freq: Three times a day (TID) | ORAL | Status: DC

## 2015-12-31 NOTE — Progress Notes (Signed)
Subjective:    Patient ID: Caitlin Stevenson, female    DOB: 13-Dec-1950,     MRN: [id number]    Brief patient profile:  52 yowf  PA, widow of Lenice Pressman MD never regular smoker but lots of second hand smoke / sob assoc with  URI's as child some better age [age] but then in her 47's noted  onset of intermittent noct wheeze seemed better p short course of prednisone but gradually more freq wheezing esp p furnace comes on each winter so self referred to pulmonary clinic 10/15/2015   Ever since childhood certain foods cause throat clearing.    History of Present Illness  10/15/2015 1st [facility] office visit/ Caitlin Stevenson  Cough onset Fall 2016 Chief Complaint  Patient presents with  . pulmonary consult    self ref for asthma. pt c/o occ. SOB. occ. wheezing. occ. prod. cough clear in color. denies chest pain/tightness.  Sharma eval at [place] pos for grass trees pets (she has dog in [place]) shots x one year no better ENT patsevoras surgery  for dev septum Ventolin twice daily since the fall and twice rx with prednisone did not eliminate symptoms or need for saba   Also choking sensation frequently daytime with voice use singulair helped ear aches  Noct wheeze most nights, variably disturbs sleep  rec GERD  Diet  Pantoprazole (protonix) 40 mg   Take  30-60 min before first meal of the day and Pepcid (famotidine)  20 mg one @  bedtime until return to office - this is the best way to tell whether stomach acid is contributing to your problem.   Only use your albuterol as a rescue medication  Try off atenolol and on Bystolic 5 mg daily in its place x 2 weeks only then try off to see if makes a difference on vs off> did not   If not able to eliminate need for ventolin after 4 weeks of reflux treatment then start dulera 100 2 every 12 hours or call me to schedule methacholine test next    12/11/2015  f/u ov/Caitlin Stevenson re: cough ? Etiology  Since Fall 2016  Chief Complaint  Patient presents with  .  Acute Visit    Pt c/o cough x 10 days- was prod in the beginning with brown/green sputum, but now non prod. She states feels like she can not catch her breath- started on pred yesterday by her OB/GYN.   since last ov maint  ppi/ pepcid hs/singulair and now back on tenormin  Even prednisone /dulera did not eliminate noct cough in past  and then cough crescendo'd with flu > no treatment change since onset  Ventolin helps the most though hfa not optimal rec Whenever you cough > cough into the flutter  Prednisone 10 mg take  4 each am x 2 days,   2 each am x 2 days,  1 each am x 2 days and stop  Dulera 100 Take 2 puffs first thing in am and then another 2 puffs about 12 hours later.  Take delsym two tsp every 12 hours and supplement if needed with vicodin  up to 1 every 4 hours  Once you have eliminated the cough for 3 straight days try reducing the vicodin  first,  then the delsym as tolerated.      PC 12/16/15 still cough/ congestion  rec Prednisone 10 mg take  4 each am x 2 days,   2 each am x 2 days,  1  each am x 2 days and stop  Ov with all meds in hand by 4/21 if not improving and be sure to keep coughing into the flutter valve     12/31/2015  f/u ov/Caitlin Stevenson re: chronic cough / no meds with her/ stopped all inhaler Chief Complaint  Patient presents with  . Follow-up    Pt states "I stopped the atenolol, so cough is gone, but I still have a reflux cough".    off atenolol the "bronchial spasms and bronchial cough" resolved but continue pattern it turns out since childhood of throat clearing p eating or drinking no longer any noct symptoms or waking up with it - using caramel - overall though  every day is better since stopped atenolol Off dulera for about a week/ still sleeping with dog in [place] Takes ppi 1-2 h ac and pepcid at hs    No obvious day to day or daytime variability or assoc excess/ purulent sputum or mucus plugs or hemoptysis or cp or chest tightness, subjective wheeze or overt  sinus or hb symptoms. No unusual exp hx or h/o childhood pna/ asthma or knowledge of premature birth.  Sleeping ok without nocturnal  or early am exacerbation  of respiratory  c/o's or need for noct saba. Also denies any obvious fluctuation of symptoms with weather or environmental changes or other aggravating or alleviating factors except as outlined above   Current Medications, Allergies, Complete Past Medical History, Past Surgical History, Family History, and Social History were reviewed in [employer] record.  ROS  The following are not active complaints unless bolded sore throat, dysphagia, dental problems, itching, sneezing,  nasal congestion or excess/ purulent secretions, ear ache,   fever, chills, sweats, unintended wt loss, classically pleuritic or exertional cp,  orthopnea pnd or leg swelling, presyncope, palpitations, abdominal pain, anorexia, nausea, vomiting, diarrhea  or change in bowel or bladder habits, change in stools or urine, dysuria,hematuria,  rash, arthralgias, visual complaints, headache, numbness, weakness or ataxia or problems with walking or coordination,  change in mood/affect or memory.                 Objective:   Physical Exam  amb wf nad with harsh honking  cough that starts with throat clearing    12/31/2015         213   12/11/15 209 lb (94.802 kg)  10/17/15 211 lb (95.709 kg)  10/15/15 210 lb 6.4 oz (95.437 kg)    Vital signs reviewed   HEENT: nl dentition, turbinates, and oropharynx. Nl external ear canals without cough reflex   NECK :  without JVD/Nodes/TM/ nl carotid upstrokes bilaterally   LUNGS: no acc muscle use,  Nl contour chest  Clear to A and P   CV:  RRR  no s3 or murmur or increase in P2, no edema   ABD:  soft and nontender with nl inspiratory excursion in the supine position. No bruits or organomegaly, bowel sounds nl  MS:  Nl gait/ ext warm without deformities, calf tenderness, cyanosis or clubbing No  obvious joint restrictions   SKIN: warm and dry without lesions    NEURO:  alert, approp, nl sensorium with  no motor deficits   CXR PA and Lateral:   12/11/2015 :    I personally reviewed images and agree with radiology impression as follows:    No infiltrate or pulmonary edema. Stable left basilar atelectasis or scarring. Again noted chronic mild elevation of left posterior hemidiaphragm.  Assessment & Plan:

## 2015-12-31 NOTE — Patient Instructions (Addendum)
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    It can be treated with medication, but also with lifestyle changes including elevation of the head of your bed (ideally with 6 inch  bed blocks),  Smoking cessation, avoidance of late meals, excessive alcohol, and avoid fatty foods, chocolate, peppermint, colas, red wine, and acidic juices such as orange juice.  NO MINT OR MENTHOL PRODUCTS SO NO COUGH DROPS  USE SUGARLESS CANDY INSTEAD (Jolley ranchers or Stover's or Life Savers) or even ice chips will also do - the key is to swallow to prevent all throat clearing. NO OIL BASED VITAMINS - use powdered substitutes.   Pulmonary follow up is as needed

## 2015-12-31 NOTE — Assessment & Plan Note (Signed)
Body mass index is 35.38    Lab Results  Component Value Date   TSH 0.31* 08/16/2006     Contributing to gerd tendency/ doe/reviewed the need and the process to achieve and maintain neg calorie balance > defer f/u primary care including intermittently monitoring thyroid status

## 2015-12-31 NOTE — Assessment & Plan Note (Addendum)
NO 10/15/2015  = 50 - Spirometry 10/15/2015 wnl including fef25-75 with last saba > 8 h prior - 12/11/2015  After extensive coaching HFA effectiveness =    75% > challenge with dulera 100 2bid > stopped late April 2017 p improved off tenormin - added flutter valve [id number] with cyclical cough regimen - NO 12/31/2015   = 17 off dulera > ok to leave off  - Rec trial of gabapentin 12/31/2015 > f/u Dr Joya Gaskins at [facility] prn   She could still have mild asthma / allergy from dog in [place] better since d/c'd atenolol but most of the evidence points here toward  Classic Upper airway cough syndrome, so named because it's frequently impossible to sort out how much is  CR/sinusitis with freq throat clearing (which can be related to primary GERD)   vs  causing  secondary (" extra esophageal")  GERD from wide swings in gastric pressure that occur with throat clearing, often  promoting self use of mint and menthol lozenges that reduce the lower esophageal sphincter tone and exacerbate the problem further in a cyclical fashion.   These are the same pts (now being labeled as having "irritable larynx syndrome" by some cough centers) who not infrequently have a history of having failed to tolerate ace inhibitors,  dry powder inhalers or biphosphonates or report having atypical reflux symptoms that don't respond to standard doses of PPI , and are easily confused as having aecopd or asthma flares by even experienced allergists/ pulmonologists.   rec trial of low dose gabapentin/gerd diet/ f/u prn here and on to [facility] next if not satisfied with cough control   I had an extended summary final  discussion with the patient reviewing all relevant studies completed to date and  lasting 25  minutes of a 40 minute office  visit    Each maintenance medication was reviewed in detail including most importantly the difference between maintenance and prns and under what circumstances the prns are to be triggered using an action plan format that is  not reflected in the computer generated alphabetically organized AVS.    Please see instructions for details which were reviewed in writing and the patient given a copy highlighting the part that I personally wrote and discussed at today's ov.

## 2015-12-31 NOTE — Assessment & Plan Note (Addendum)
Trial of bystolic [id number] due to ? Asthma  - pt d/c'd atenolol on her own mid /late April 2017 >  improved sense of asthma control   Ok off BB which would be preferable in this setting though still ok to restart bisoprolol or bystolic in low doses if BB needed >  Follow up per Primary Care planned

## 2016-02-04 ENCOUNTER — Ambulatory Visit (INDEPENDENT_AMBULATORY_CARE_PROVIDER_SITE_OTHER): Payer: Medicare Other

## 2016-02-04 DIAGNOSIS — Z111 Encounter for screening for respiratory tuberculosis: Secondary | ICD-10-CM

## 2016-02-06 ENCOUNTER — Telehealth: Payer: Self-pay

## 2016-02-06 LAB — TB SKIN TEST
Induration: 0 mm
TB Skin Test: NEGATIVE

## 2016-02-06 NOTE — Telephone Encounter (Signed)
error 

## 2016-02-25 IMAGING — CR DG CHEST 2V
2 series · 2 of 2 positions shown · non-contrast
Comparison: None in PACs

CLINICAL DATA: Cough, shortness of breath for the past week;
history of asthma.

EXAM:
CHEST  2 VIEW

[w chest pa]
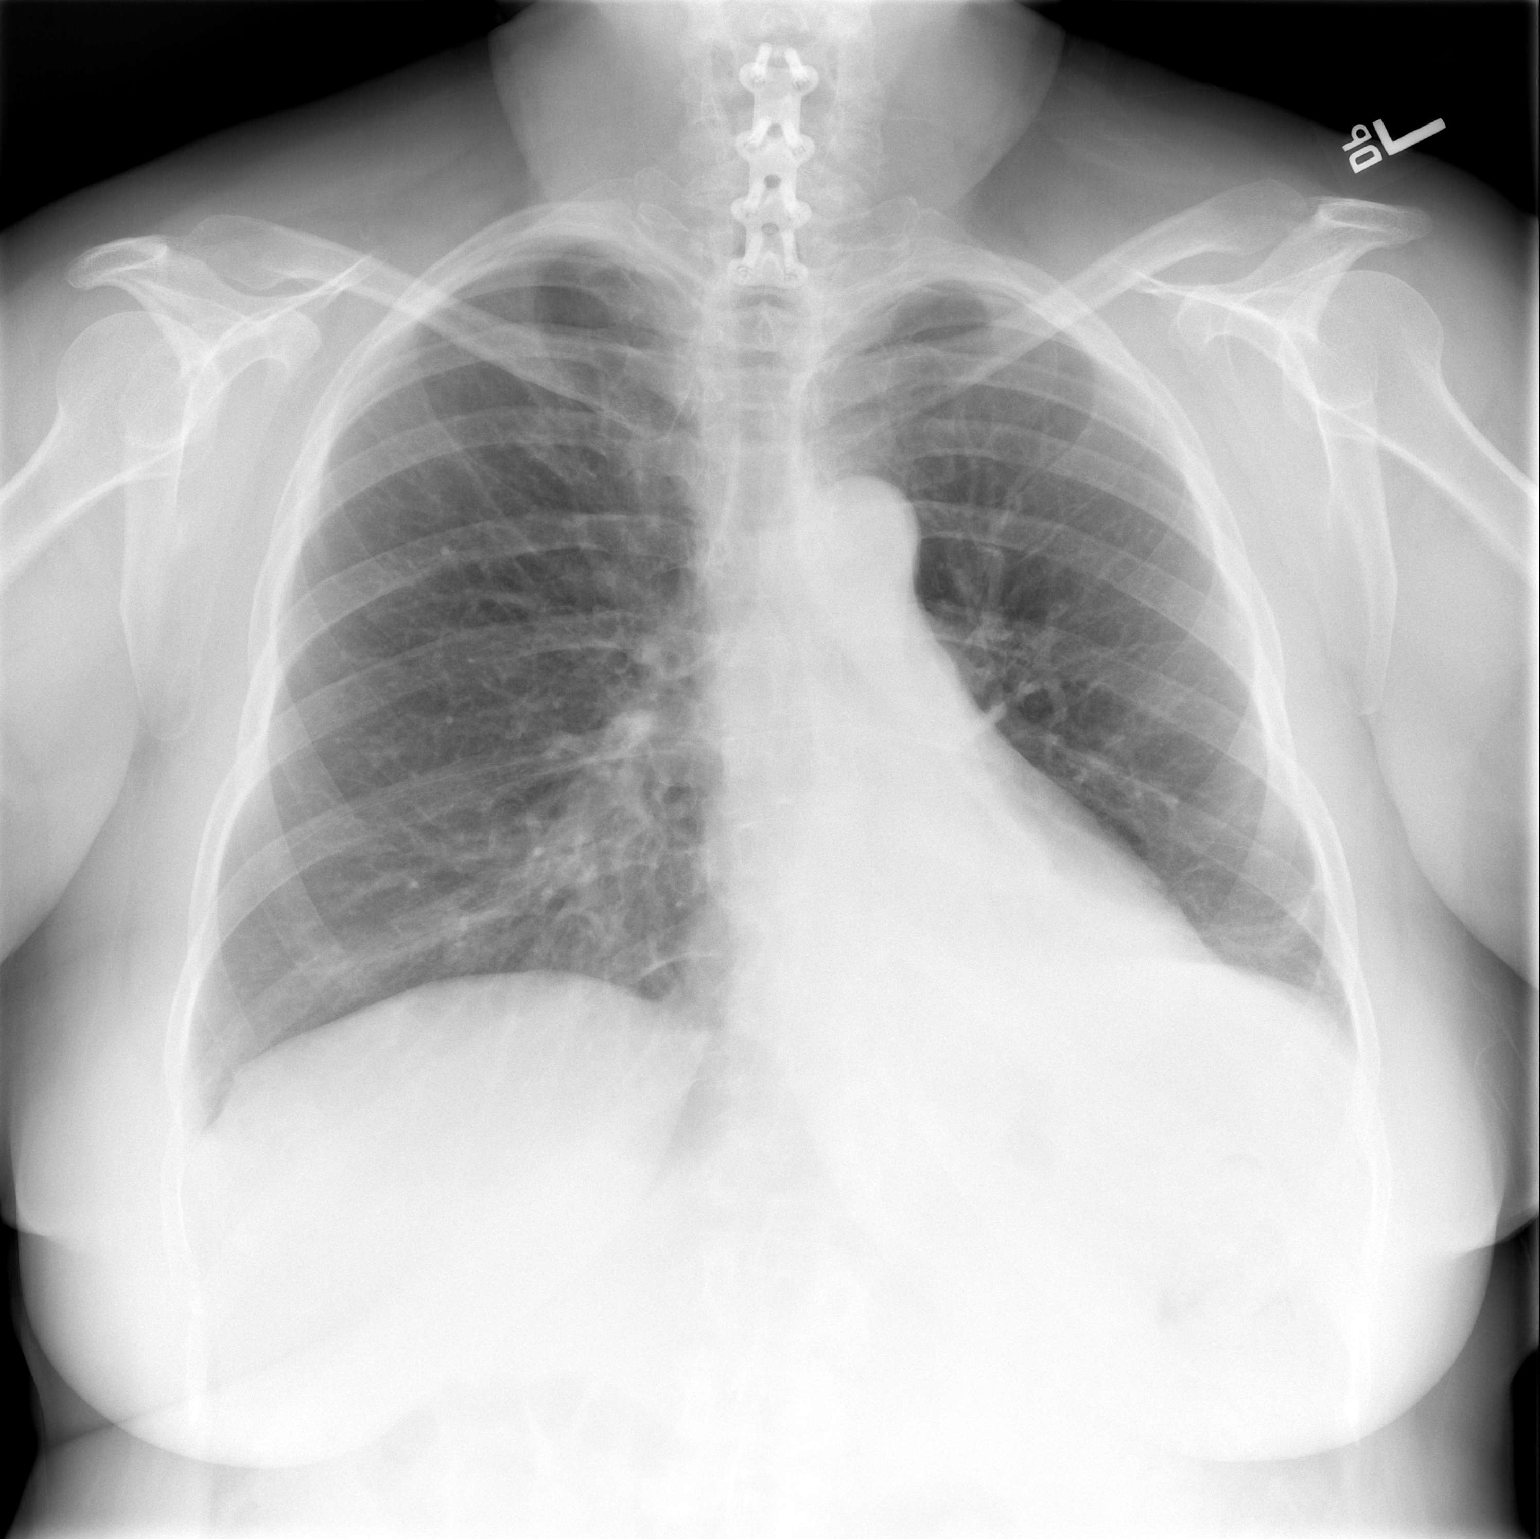

[w chest lat]
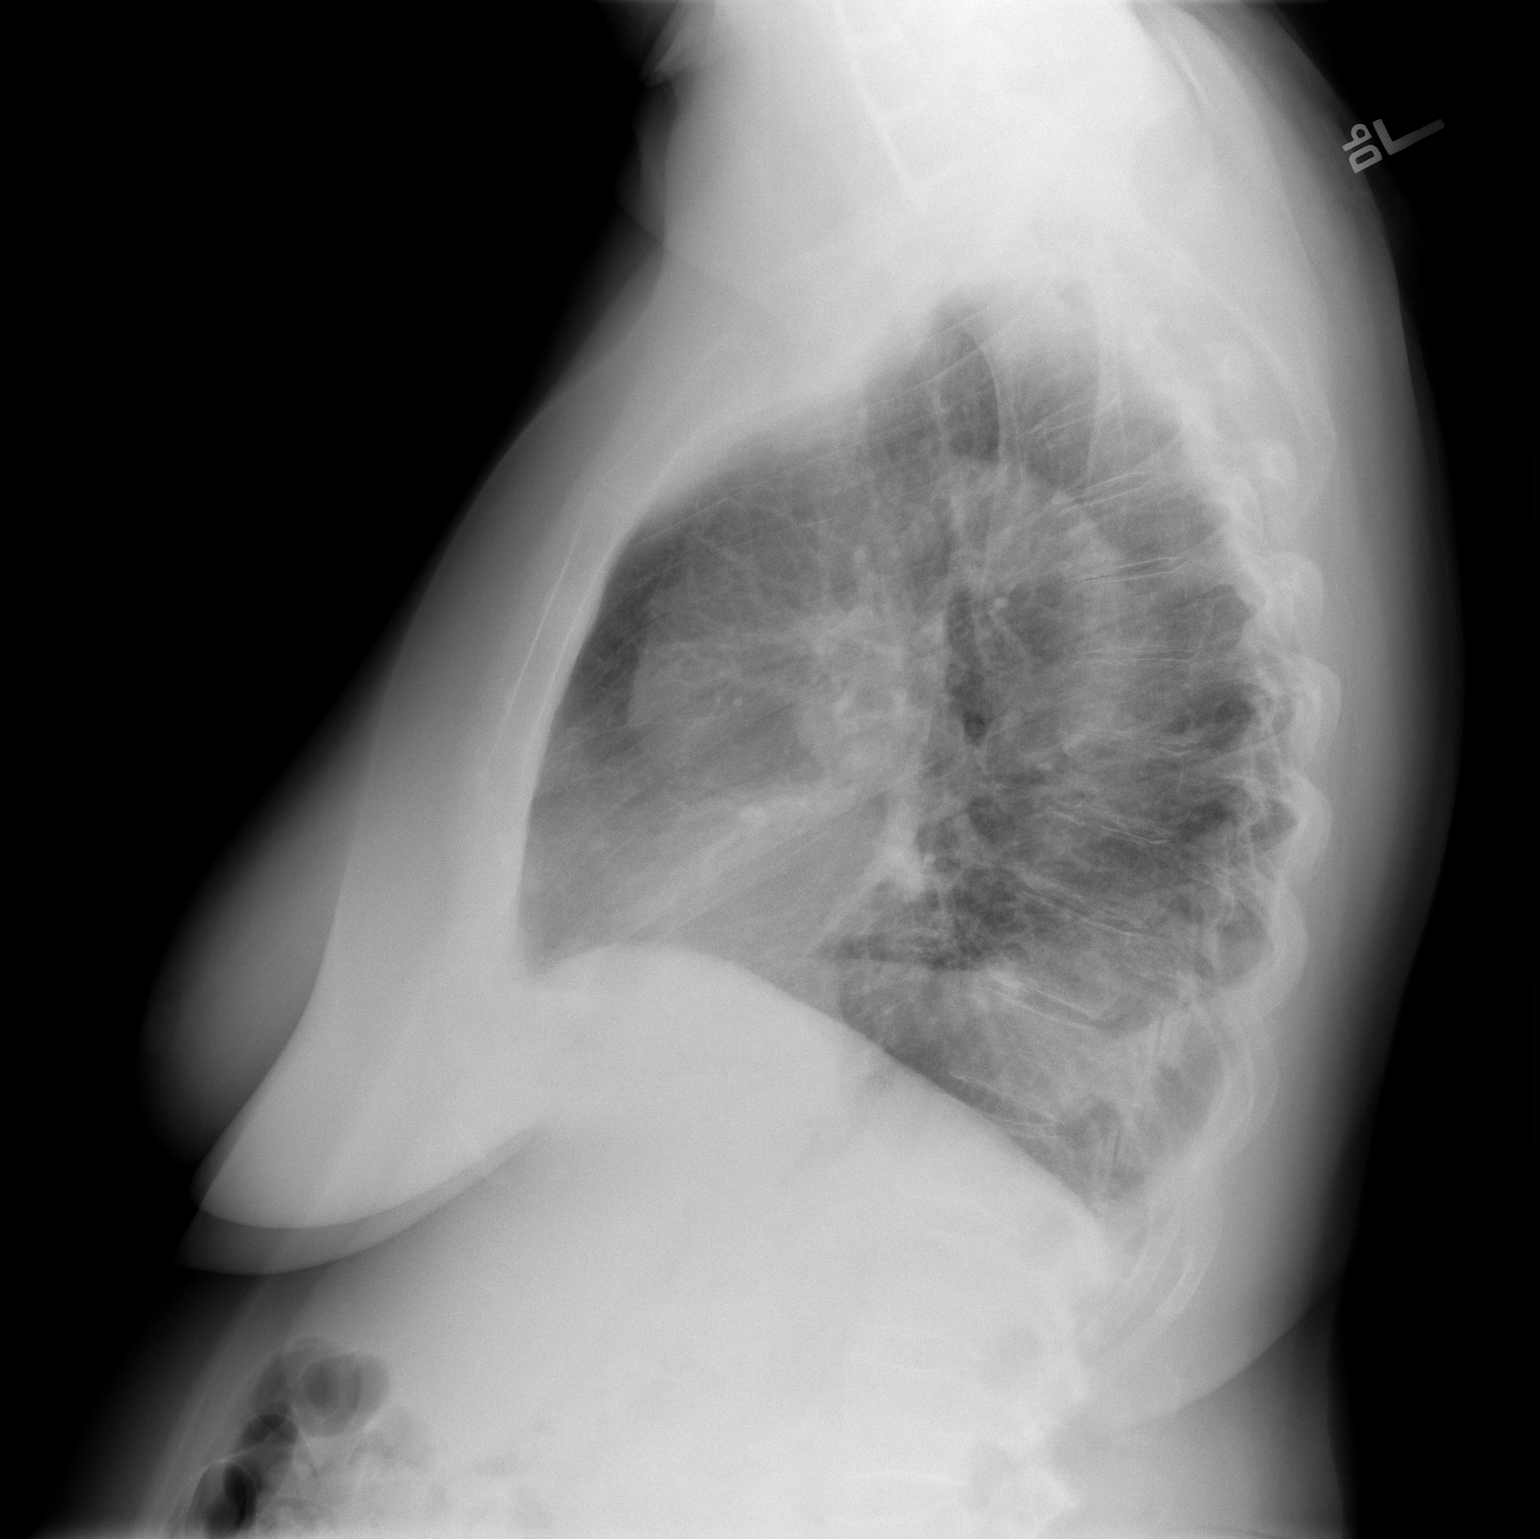

[2 of 2 positions shown; findings below may reference images not displayed]

FINDINGS: The lungs are adequately inflated. There is no focal infiltrate.
There is a moderate-sized hiatal hernia with an air-fluid level. The
heart and pulmonary vascularity are normal. The mediastinum is
normal in width. There is no pleural effusion. There is multilevel
degenerative disc disease of the thoracic spine.
IMPRESSION: Increased interstitial markings likely reflect known reactive airway
disease. There is no alveolar pneumonia, CHF, nor other acute
cardiopulmonary disease. There is a moderate-sized hiatal hernia.

## 2016-03-06 IMAGING — MG MM DIAG BREAST TOMO BILATERAL
1 series · 1 of 5 positions shown · non-contrast
Comparison: Previous exam(s).

CLINICAL DATA: 64-year-old female presenting for evaluation of a
palpable area of concern in the superior left breast present for
many years. This fluctuates in size and demonstrates intermittent
tenderness.

EXAM:
DIGITAL DIAGNOSTIC BILATERAL MAMMOGRAM WITH 3D TOMOSYNTHESIS AND CAD
LEFT BREAST ULTRASOUND

[R CC tomo · tomo slice 33/66.0]
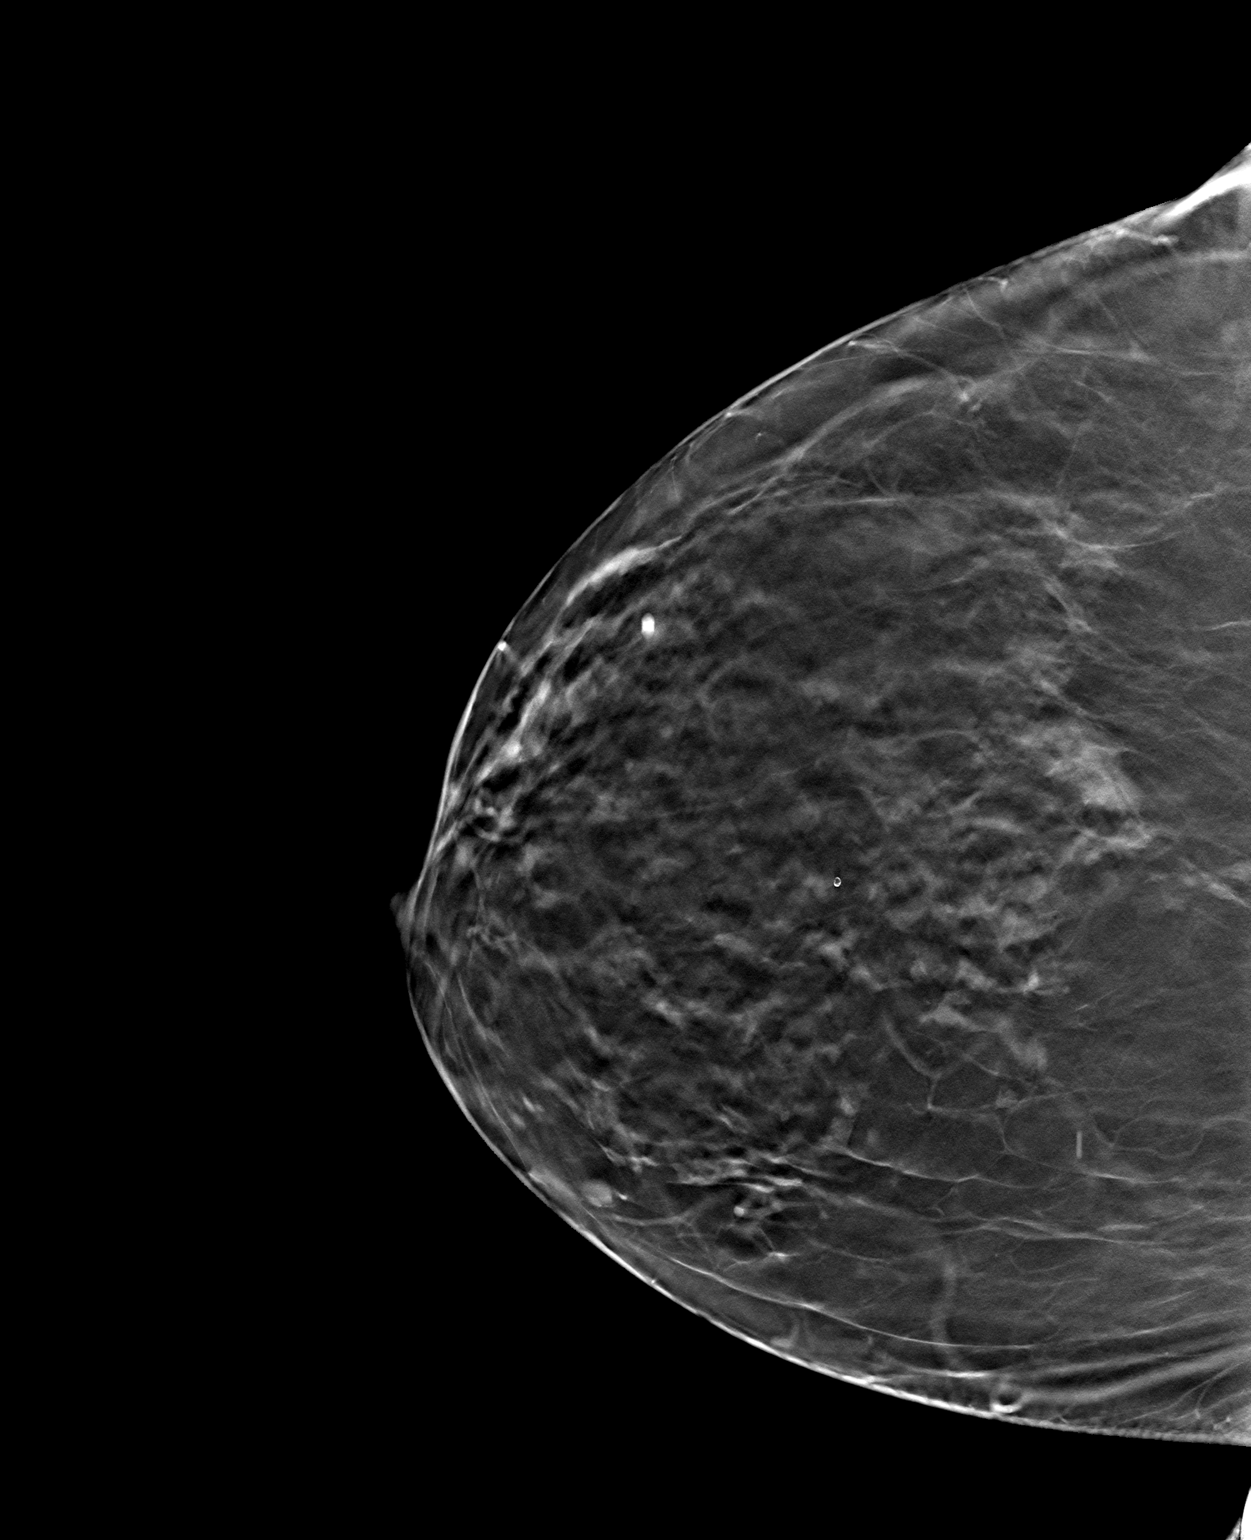

[1 of 5 positions shown; findings below may reference images not displayed]

ACR Breast Density Category b: There are scattered areas of
fibroglandular density.
FINDINGS: A palpable marker has been placed on the superior aspect of the left
breast. No mammographic abnormalities are seen deep to this marker.
No suspicious calcifications, masses or areas of distortion are seen
in the bilateral breasts.

Mammographic images were processed with CAD.

Physical exam of the superior left breast demonstrates normal
fibroglandular tissue. No discrete palpable masses can be
identified.

Ultrasound of the superior left breast demonstrates normal
fibroglandular tissue. No masses or suspicious areas of shadowing
are identified.
IMPRESSION: 1. No mammographic or targeted sonographic correlate identified for
the palpable area of concern in the superior left breast.

2.  No mammographic evidence of malignancy in the bilateral breasts.

RECOMMENDATION:
1. Clinical follow-up recommended for the palpable area of concern
in the superior left breast. Any further workup should be based on
clinical grounds.

2.  Screening mammogram in one year.(Code:[place]-O-816)

I have discussed the findings and recommendations with the patient.
Results were also provided in writing at the conclusion of the
visit. If applicable, a reminder letter will be sent to the patient
regarding the next appointment.

BI-RADS CATEGORY  1: Negative.

## 2016-04-17 ENCOUNTER — Ambulatory Visit (INDEPENDENT_AMBULATORY_CARE_PROVIDER_SITE_OTHER): Payer: Medicare Other | Admitting: Internal Medicine

## 2016-04-17 ENCOUNTER — Other Ambulatory Visit (INDEPENDENT_AMBULATORY_CARE_PROVIDER_SITE_OTHER): Payer: Medicare Other

## 2016-04-17 ENCOUNTER — Encounter: Payer: Self-pay | Admitting: Internal Medicine

## 2016-04-17 VITALS — BP 118/68 | HR 86 | Temp 98.9°F | Resp 16 | Ht 65.0 in | Wt 211.0 lb

## 2016-04-17 DIAGNOSIS — E1169 Type 2 diabetes mellitus with other specified complication: Secondary | ICD-10-CM

## 2016-04-17 DIAGNOSIS — Z114 Encounter for screening for human immunodeficiency virus [HIV]: Secondary | ICD-10-CM

## 2016-04-17 DIAGNOSIS — E119 Type 2 diabetes mellitus without complications: Secondary | ICD-10-CM

## 2016-04-17 DIAGNOSIS — R7989 Other specified abnormal findings of blood chemistry: Secondary | ICD-10-CM | POA: Diagnosis not present

## 2016-04-17 DIAGNOSIS — E669 Obesity, unspecified: Secondary | ICD-10-CM

## 2016-04-17 DIAGNOSIS — Z23 Encounter for immunization: Secondary | ICD-10-CM | POA: Diagnosis not present

## 2016-04-17 DIAGNOSIS — E039 Hypothyroidism, unspecified: Secondary | ICD-10-CM

## 2016-04-17 DIAGNOSIS — G43109 Migraine with aura, not intractable, without status migrainosus: Secondary | ICD-10-CM

## 2016-04-17 DIAGNOSIS — I10 Essential (primary) hypertension: Secondary | ICD-10-CM | POA: Diagnosis not present

## 2016-04-17 DIAGNOSIS — Z1159 Encounter for screening for other viral diseases: Secondary | ICD-10-CM

## 2016-04-17 LAB — LIPID PANEL
Cholesterol: 206 mg/dL — ABNORMAL HIGH (ref 0–200)
HDL: 44.3 mg/dL (ref >39.00)
NonHDL: 161.21
Total CHOL/HDL Ratio: 5
Triglycerides: 245 mg/dL — ABNORMAL HIGH (ref 0.0–149.0)
VLDL: 49 mg/dL — ABNORMAL HIGH (ref 0.0–40.0)

## 2016-04-17 LAB — COMPREHENSIVE METABOLIC PANEL
ALT: 19 U/L (ref 0–35)
AST: 17 U/L (ref 0–37)
Albumin: 4.2 g/dL (ref 3.5–5.2)
Alkaline Phosphatase: 77 U/L (ref 39–117)
BUN: 18 mg/dL (ref 6–23)
CO2: 27 mEq/L (ref 19–32)
Calcium: 9.1 mg/dL (ref 8.4–10.5)
Chloride: 104 mEq/L (ref 96–112)
Creatinine, Ser: 0.67 mg/dL (ref 0.40–1.20)
GFR: 93.66 mL/min (ref >60.00)
Glucose, Bld: 112 mg/dL — ABNORMAL HIGH (ref 70–99)
Potassium: 3.9 mEq/L (ref 3.5–5.1)
Sodium: 139 mEq/L (ref 135–145)
Total Bilirubin: 0.4 mg/dL (ref 0.2–1.2)
Total Protein: 6.8 g/dL (ref 6.0–8.3)

## 2016-04-17 LAB — HEMOGLOBIN A1C: Hgb A1c MFr Bld: 5.5 % (ref 4.6–6.5)

## 2016-04-17 LAB — HEPATITIS C ANTIBODY: HCV Ab: NEGATIVE

## 2016-04-17 LAB — MICROALBUMIN / CREATININE URINE RATIO
Creatinine,U: 90.7 mg/dL
Microalb Creat Ratio: 0.8 mg/g (ref 0.0–30.0)
Microalb, Ur: <0.7 mg/dL (ref 0.0–1.9)

## 2016-04-17 LAB — T4, FREE: Free T4: 0.71 ng/dL (ref 0.60–1.60)

## 2016-04-17 LAB — TSH: TSH: 0.12 u[IU]/mL — ABNORMAL LOW (ref 0.35–4.50)

## 2016-04-17 LAB — LDL CHOLESTEROL, DIRECT: Direct LDL: 134 mg/dL

## 2016-04-17 MED ORDER — LEVOTHYROXINE SODIUM 137 MCG PO TABS: 137.0000 ug | ORAL_TABLET | Freq: Every day | ORAL | 3 refills | Status: DC

## 2016-04-17 MED ORDER — HYDROCHLOROTHIAZIDE 12.5 MG PO CAPS: 12.5000 mg | ORAL_CAPSULE | Freq: Every day | ORAL | 3 refills | Status: DC

## 2016-04-17 MED ORDER — SUMATRIPTAN SUCCINATE 100 MG PO TABS: 100.0000 mg | ORAL_TABLET | ORAL | 3 refills | Status: DC | PRN

## 2016-04-17 NOTE — Assessment & Plan Note (Signed)
Checking TSH and free T4 and adjust her synthroid 137 mcg daily as needed.

## 2016-04-17 NOTE — Assessment & Plan Note (Signed)
Foot exam and microalbumin to creatinine ratio done today. Checking HgA1c and lipid panel and CMP and adjust as needed. Not complicated and diet controlled currently.

## 2016-04-17 NOTE — Patient Instructions (Signed)
We are checking the labs and we have given you the tetanus and pneumonia shots today.

## 2016-04-17 NOTE — Assessment & Plan Note (Signed)
BP at goal on hctz 12.5 mg daily, checking CMP and adjust as needed. Refilled today.

## 2016-04-17 NOTE — Progress Notes (Signed)
   Subjective:    Patient ID: Caitlin Stevenson, female    DOB: [birth year], 65 y.o.   MRN: [id number]  HPI The patient is a 65 YO female coming in for follow up of her medical problems including her diabetes (controlled by diet now, not complicated, no low or high sugars at home symptoms) and her blood pressure (taking hctz daily, stopped her atenolol which was causing a cough) and her hypothyroid (taking synthroid 137 ,mcg daily, no symptoms of over or under replacement). No new concerns.   Review of Systems  Constitutional: Negative for activity change, appetite change, chills, fatigue, fever and unexpected weight change.  HENT: Negative for congestion, nosebleeds, postnasal drip, rhinorrhea and sinus pressure.   Eyes: Negative.   Respiratory: Negative for cough, chest tightness, shortness of breath and wheezing.   Cardiovascular: Negative for chest pain, palpitations and leg swelling.  Gastrointestinal: Negative for abdominal distention, abdominal pain, diarrhea, nausea and vomiting.  Musculoskeletal: Negative.   Skin: Negative.   Neurological: Negative.       Objective:   Physical Exam  Constitutional: She is oriented to person, place, and time. She appears well-developed and well-nourished.  Overweight  HENT:  Head: Normocephalic and atraumatic.  Eyes: EOM are normal.  Neck: Normal range of motion.  Cardiovascular: Normal rate and regular rhythm.   Pulmonary/Chest: Effort normal and breath sounds normal. No respiratory distress. She has no wheezes. She has no rales.  Abdominal: Soft. Bowel sounds are normal. She exhibits no distension. There is no tenderness. There is no rebound.  Musculoskeletal: She exhibits no edema.  Neurological: She is alert and oriented to person, place, and time. Coordination normal.  Skin: Skin is warm and dry.  Foot exam done  Psychiatric: She has a normal mood and affect.   Vitals:   04/17/16 0929  BP: 118/68  Pulse: 86  Resp: 16  Temp: 98.9 F  (37.2 C)  TempSrc: Oral  SpO2: 96%  Weight: 211 lb (95.7 kg)  Height: 5\' 5"  (1.651 m)      Assessment & Plan:  Prevnar 13 give and tdap given at visit

## 2016-04-17 NOTE — Assessment & Plan Note (Signed)
Increase sumatriptan 100 mg due to some breakthrough with migraines.

## 2016-04-17 NOTE — Progress Notes (Signed)
Pre visit review using our clinic review tool, if applicable. No additional management support is needed unless otherwise documented below in the visit note. 

## 2016-04-18 LAB — HIV ANTIBODY (ROUTINE TESTING W REFLEX): HIV 1&2 Ab, 4th Generation: NONREACTIVE

## 2016-04-27 DIAGNOSIS — D485 Neoplasm of uncertain behavior of skin: Secondary | ICD-10-CM | POA: Diagnosis not present

## 2016-04-27 DIAGNOSIS — L821 Other seborrheic keratosis: Secondary | ICD-10-CM | POA: Diagnosis not present

## 2016-04-27 DIAGNOSIS — L720 Epidermal cyst: Secondary | ICD-10-CM | POA: Diagnosis not present

## 2016-04-27 DIAGNOSIS — L570 Actinic keratosis: Secondary | ICD-10-CM | POA: Diagnosis not present

## 2016-04-27 DIAGNOSIS — B078 Other viral warts: Secondary | ICD-10-CM | POA: Diagnosis not present

## 2016-04-27 DIAGNOSIS — L820 Inflamed seborrheic keratosis: Secondary | ICD-10-CM | POA: Diagnosis not present

## 2016-05-29 DIAGNOSIS — N390 Urinary tract infection, site not specified: Secondary | ICD-10-CM | POA: Diagnosis not present

## 2016-06-11 DIAGNOSIS — E782 Mixed hyperlipidemia: Secondary | ICD-10-CM | POA: Diagnosis not present

## 2016-06-11 DIAGNOSIS — E039 Hypothyroidism, unspecified: Secondary | ICD-10-CM | POA: Diagnosis not present

## 2016-06-11 DIAGNOSIS — R739 Hyperglycemia, unspecified: Secondary | ICD-10-CM | POA: Diagnosis not present

## 2016-06-11 DIAGNOSIS — E060 Acute thyroiditis: Secondary | ICD-10-CM | POA: Diagnosis not present

## 2016-06-11 DIAGNOSIS — E8840 Mitochondrial metabolism disorder, unspecified: Secondary | ICD-10-CM | POA: Diagnosis not present

## 2016-06-11 DIAGNOSIS — N951 Menopausal and female climacteric states: Secondary | ICD-10-CM | POA: Diagnosis not present

## 2016-06-11 DIAGNOSIS — B3782 Candidal enteritis: Secondary | ICD-10-CM | POA: Diagnosis not present

## 2016-06-11 DIAGNOSIS — D509 Iron deficiency anemia, unspecified: Secondary | ICD-10-CM | POA: Diagnosis not present

## 2016-06-11 DIAGNOSIS — E7210 Disorders of sulfur-bearing amino-acid metabolism, unspecified: Secondary | ICD-10-CM | POA: Diagnosis not present

## 2016-06-11 DIAGNOSIS — E279 Disorder of adrenal gland, unspecified: Secondary | ICD-10-CM | POA: Diagnosis not present

## 2016-06-11 DIAGNOSIS — F419 Anxiety disorder, unspecified: Secondary | ICD-10-CM | POA: Diagnosis not present

## 2016-06-15 DIAGNOSIS — E039 Hypothyroidism, unspecified: Secondary | ICD-10-CM | POA: Diagnosis not present

## 2016-07-27 DIAGNOSIS — M25512 Pain in left shoulder: Secondary | ICD-10-CM | POA: Diagnosis not present

## 2016-07-27 DIAGNOSIS — M25562 Pain in left knee: Secondary | ICD-10-CM | POA: Diagnosis not present

## 2016-08-26 DIAGNOSIS — Z01419 Encounter for gynecological examination (general) (routine) without abnormal findings: Secondary | ICD-10-CM | POA: Diagnosis not present

## 2016-08-26 DIAGNOSIS — Z6834 Body mass index (BMI) 34.0-34.9, adult: Secondary | ICD-10-CM | POA: Diagnosis not present

## 2016-08-26 DIAGNOSIS — N951 Menopausal and female climacteric states: Secondary | ICD-10-CM | POA: Diagnosis not present

## 2016-09-11 DIAGNOSIS — Z1231 Encounter for screening mammogram for malignant neoplasm of breast: Secondary | ICD-10-CM | POA: Diagnosis not present

## 2016-10-05 ENCOUNTER — Encounter: Payer: Self-pay | Admitting: Internal Medicine

## 2016-10-05 ENCOUNTER — Ambulatory Visit (INDEPENDENT_AMBULATORY_CARE_PROVIDER_SITE_OTHER): Payer: Medicare Other | Admitting: Internal Medicine

## 2016-10-05 VITALS — BP 160/100 | HR 75 | Temp 98.5°F | Ht 65.0 in | Wt 208.0 lb

## 2016-10-05 DIAGNOSIS — K429 Umbilical hernia without obstruction or gangrene: Secondary | ICD-10-CM | POA: Diagnosis not present

## 2016-10-05 MED ORDER — SUMATRIPTAN SUCCINATE 100 MG PO TABS: 100.0000 mg | ORAL_TABLET | ORAL | 3 refills | Status: DC | PRN

## 2016-10-05 NOTE — Progress Notes (Signed)
Pre visit review using our clinic review tool, if applicable. No additional management support is needed unless otherwise documented below in the visit note. 

## 2016-10-05 NOTE — Progress Notes (Signed)
   Subjective:    Patient ID: Caitlin Stevenson, female    DOB: [birth year], 66 y.o.   MRN: [id number]  HPI The patient is a 66 YO female coming in for her umbilical hernia. She has had it for some time but it is growing lately. It is also starting to hurt a lot of the time. Especially later in the day. She does notice it more after she eats or is bloated and is not sure if it is the food or her hernia. She denies cold or cough lately. No straining from constipation.   Review of Systems  Constitutional: Negative for activity change, appetite change, fatigue, fever and unexpected weight change.  Respiratory: Negative.   Cardiovascular: Negative.   Gastrointestinal: Positive for abdominal distention and abdominal pain. Negative for blood in stool, constipation, diarrhea, nausea and vomiting.  Musculoskeletal: Positive for myalgias. Negative for arthralgias, back pain and gait problem.  Skin: Negative.   Neurological: Negative.       Objective:   Physical Exam  Constitutional: She is oriented to person, place, and time. She appears well-developed and well-nourished.  Overweight  HENT:  Head: Normocephalic and atraumatic.  Eyes: EOM are normal.  Neck: Normal range of motion.  Cardiovascular: Normal rate and regular rhythm.   Pulmonary/Chest: Effort normal and breath sounds normal.  Abdominal: Soft. Bowel sounds are normal. She exhibits distension. She exhibits no mass. There is no tenderness. There is no rebound and no guarding.  Large umbilical hernia midline from the umbilicus to the diaphragm. Easily reducible and not tender to touch.   Neurological: She is alert and oriented to person, place, and time.  Skin: Skin is warm and dry.   Vitals:   10/05/16 1326  BP: (!) 160/100  Pulse: 75  Temp: 98.5 F (36.9 C)  TempSrc: Oral  SpO2: 98%  Weight: 208 lb (94.3 kg)  Height: 5\' 5"  (1.651 m)      Assessment & Plan:

## 2016-10-05 NOTE — Assessment & Plan Note (Addendum)
Referral to general surgery for the pain and increasing size. We did discuss how weight gain can cause worsening of hernia and how weight loss prior to surgery is ideal for less risk of recurrence.

## 2016-10-05 NOTE — Patient Instructions (Signed)
We will get you in with the surgeon for the stomach hernia.  Keep a watch on the blood pressure at home once the tests are over and call us if it is mostly in the 150s for the top number or 90s for the bottom number.

## 2016-11-26 DIAGNOSIS — M6208 Separation of muscle (nontraumatic), other site: Secondary | ICD-10-CM | POA: Diagnosis not present

## 2017-01-11 DIAGNOSIS — N951 Menopausal and female climacteric states: Secondary | ICD-10-CM | POA: Diagnosis not present

## 2017-01-11 DIAGNOSIS — R6882 Decreased libido: Secondary | ICD-10-CM | POA: Diagnosis not present

## 2017-01-11 DIAGNOSIS — N390 Urinary tract infection, site not specified: Secondary | ICD-10-CM | POA: Diagnosis not present

## 2017-02-09 DIAGNOSIS — Z1231 Encounter for screening mammogram for malignant neoplasm of breast: Secondary | ICD-10-CM | POA: Diagnosis not present

## 2017-02-10 ENCOUNTER — Telehealth: Payer: Self-pay | Admitting: Internal Medicine

## 2017-02-10 MED ORDER — LEVOTHYROXINE SODIUM 137 MCG PO TABS: 137.0000 ug | ORAL_TABLET | Freq: Every day | ORAL | 0 refills | Status: DC

## 2017-02-10 NOTE — Telephone Encounter (Signed)
Pt is due for annual appt in August sent 90 day to [name] until appt...Caitlin Stevenson

## 2017-02-10 NOTE — Telephone Encounter (Signed)
levothyroxine (SYNTHROID, LEVOTHROID) 137 MCG tablet  Patient is requesting a refill for 90 day supply with refills.   Kristopher Oppenheim  [address] [place], [place], [place] [address] [phone]

## 2017-02-11 DIAGNOSIS — E669 Obesity, unspecified: Secondary | ICD-10-CM | POA: Diagnosis not present

## 2017-02-11 DIAGNOSIS — I10 Essential (primary) hypertension: Secondary | ICD-10-CM | POA: Diagnosis not present

## 2017-02-25 DIAGNOSIS — S61200A Unspecified open wound of right index finger without damage to nail, initial encounter: Secondary | ICD-10-CM | POA: Diagnosis not present

## 2017-03-22 DIAGNOSIS — G4700 Insomnia, unspecified: Secondary | ICD-10-CM | POA: Diagnosis not present

## 2017-03-22 DIAGNOSIS — G4719 Other hypersomnia: Secondary | ICD-10-CM | POA: Diagnosis not present

## 2017-03-22 DIAGNOSIS — R0683 Snoring: Secondary | ICD-10-CM | POA: Diagnosis not present

## 2017-04-26 ENCOUNTER — Other Ambulatory Visit: Payer: Self-pay | Admitting: Internal Medicine

## 2017-04-28 ENCOUNTER — Telehealth: Payer: Self-pay | Admitting: Internal Medicine

## 2017-04-28 ENCOUNTER — Other Ambulatory Visit: Payer: Self-pay

## 2017-04-28 DIAGNOSIS — I10 Essential (primary) hypertension: Secondary | ICD-10-CM

## 2017-04-28 MED ORDER — HYDROCHLOROTHIAZIDE 12.5 MG PO CAPS: 12.5000 mg | ORAL_CAPSULE | Freq: Every day | ORAL | 0 refills | Status: DC

## 2017-04-28 NOTE — Telephone Encounter (Signed)
Called pt to schedule AWV. Left vm for pt to call office to schedule appt. Per phone note on 02/10/17, pt also needs to schedule annual f/u visit with Dr. Sharlet Salina.

## 2017-05-04 DIAGNOSIS — H25043 Posterior subcapsular polar age-related cataract, bilateral: Secondary | ICD-10-CM | POA: Diagnosis not present

## 2017-05-04 DIAGNOSIS — H524 Presbyopia: Secondary | ICD-10-CM | POA: Diagnosis not present

## 2017-05-12 ENCOUNTER — Ambulatory Visit: Payer: Medicare Other | Admitting: Internal Medicine

## 2017-05-13 ENCOUNTER — Other Ambulatory Visit (INDEPENDENT_AMBULATORY_CARE_PROVIDER_SITE_OTHER): Payer: Medicare Other

## 2017-05-13 ENCOUNTER — Encounter: Payer: Self-pay | Admitting: Internal Medicine

## 2017-05-13 ENCOUNTER — Other Ambulatory Visit: Payer: Self-pay | Admitting: Internal Medicine

## 2017-05-13 ENCOUNTER — Ambulatory Visit (INDEPENDENT_AMBULATORY_CARE_PROVIDER_SITE_OTHER): Payer: Medicare Other | Admitting: Internal Medicine

## 2017-05-13 VITALS — BP 126/76 | HR 60 | Temp 98.2°F | Ht 65.0 in | Wt 204.0 lb

## 2017-05-13 DIAGNOSIS — E785 Hyperlipidemia, unspecified: Secondary | ICD-10-CM

## 2017-05-13 DIAGNOSIS — E039 Hypothyroidism, unspecified: Secondary | ICD-10-CM

## 2017-05-13 DIAGNOSIS — I10 Essential (primary) hypertension: Secondary | ICD-10-CM

## 2017-05-13 DIAGNOSIS — E1169 Type 2 diabetes mellitus with other specified complication: Secondary | ICD-10-CM | POA: Diagnosis not present

## 2017-05-13 DIAGNOSIS — E559 Vitamin D deficiency, unspecified: Secondary | ICD-10-CM | POA: Diagnosis not present

## 2017-05-13 DIAGNOSIS — R5383 Other fatigue: Secondary | ICD-10-CM | POA: Diagnosis not present

## 2017-05-13 DIAGNOSIS — E669 Obesity, unspecified: Secondary | ICD-10-CM | POA: Diagnosis not present

## 2017-05-13 DIAGNOSIS — E2839 Other primary ovarian failure: Secondary | ICD-10-CM | POA: Diagnosis not present

## 2017-05-13 LAB — COMPREHENSIVE METABOLIC PANEL
ALT: 17 U/L (ref 0–35)
AST: 17 U/L (ref 0–37)
Albumin: 4.1 g/dL (ref 3.5–5.2)
Alkaline Phosphatase: 64 U/L (ref 39–117)
BUN: 20 mg/dL (ref 6–23)
CO2: 28 mEq/L (ref 19–32)
Calcium: 9.2 mg/dL (ref 8.4–10.5)
Chloride: 103 mEq/L (ref 96–112)
Creatinine, Ser: 0.85 mg/dL (ref 0.40–1.20)
GFR: 70.93 mL/min (ref >60.00)
Glucose, Bld: 101 mg/dL — ABNORMAL HIGH (ref 70–99)
Potassium: 4.2 mEq/L (ref 3.5–5.1)
Sodium: 138 mEq/L (ref 135–145)
Total Bilirubin: 0.6 mg/dL (ref 0.2–1.2)
Total Protein: 6.6 g/dL (ref 6.0–8.3)

## 2017-05-13 LAB — TSH: TSH: 0.05 u[IU]/mL — ABNORMAL LOW (ref 0.35–4.50)

## 2017-05-13 LAB — CBC
HCT: 41 % (ref 36.0–46.0)
Hemoglobin: 14.3 g/dL (ref 12.0–15.0)
MCHC: 34.9 g/dL (ref 30.0–36.0)
MCV: 91.9 fl (ref 78.0–100.0)
Platelets: 245 10*3/uL (ref 150.0–400.0)
RBC: 4.45 Mil/uL (ref 3.87–5.11)
RDW: 13.2 % (ref 11.5–15.5)
WBC: 4.6 10*3/uL (ref 4.0–10.5)

## 2017-05-13 LAB — LIPID PANEL
Cholesterol: 227 mg/dL — ABNORMAL HIGH (ref 0–200)
HDL: 57.2 mg/dL (ref >39.00)
LDL Cholesterol: 141 mg/dL — ABNORMAL HIGH (ref 0–99)
NonHDL: 169.79
Total CHOL/HDL Ratio: 4
Triglycerides: 146 mg/dL (ref 0.0–149.0)
VLDL: 29.2 mg/dL (ref 0.0–40.0)

## 2017-05-13 LAB — VITAMIN D 25 HYDROXY (VIT D DEFICIENCY, FRACTURES): VITD: 25.64 ng/mL — ABNORMAL LOW (ref 30.00–100.00)

## 2017-05-13 MED ORDER — NEOMYCIN-POLYMYXIN-HC 3.5-10000-1 OT SUSP: 3.0000 [drp] | Freq: Three times a day (TID) | OTIC | 0 refills | Status: DC

## 2017-05-13 NOTE — Assessment & Plan Note (Signed)
Diet controlled, not complicated. Foot exam done and reminded about eye exam.

## 2017-05-13 NOTE — Progress Notes (Signed)
   Subjective:    Patient ID: Caitlin Stevenson, female    DOB: July 07, 1951, 66 y.o.   MRN: [id number]  HPI The patient is a 66 YO female coming in for follow up of her medical conditions including her thyroid (Taking synthroid and no levels in some time, no fatigue, cold or heat intolerance), morbid obesity (weight stable from last time, not exercising and no changes to diet since last visit), and her hypertension (taking hctz and BP normal at home, no headaches or chest pains) and her diabetes (diet controlled, not complicated, denies low sugars, diet not ideal).   Review of Systems  Constitutional: Negative.   HENT: Negative.   Eyes: Negative.   Respiratory: Negative for cough, chest tightness and shortness of breath.   Cardiovascular: Negative for chest pain, palpitations and leg swelling.  Gastrointestinal: Negative for abdominal distention, abdominal pain, constipation, diarrhea, nausea and vomiting.  Musculoskeletal: Negative.   Skin: Negative.   Neurological: Negative.   Psychiatric/Behavioral: Negative.       Objective:   Physical Exam  Constitutional: She is oriented to person, place, and time. She appears well-developed and well-nourished.  HENT:  Head: Normocephalic and atraumatic.  Eyes: EOM are normal.  Neck: Normal range of motion.  Cardiovascular: Normal rate and regular rhythm.   Pulmonary/Chest: Effort normal and breath sounds normal. No respiratory distress. She has no wheezes. She has no rales.  Abdominal: Soft. Bowel sounds are normal. She exhibits no distension. There is no tenderness. There is no rebound.  Musculoskeletal: She exhibits no edema.  Neurological: She is alert and oriented to person, place, and time. Coordination normal.  Skin: Skin is warm and dry.  Psychiatric: She has a normal mood and affect.   Vitals:   05/13/17 1006  BP: 126/76  Pulse: 60  Temp: 98.2 F (36.8 C)  SpO2: 98%  Weight: 204 lb (92.5 kg)  Height: 5\' 5"  (1.651 m)        Assessment & Plan:

## 2017-05-13 NOTE — Assessment & Plan Note (Signed)
Taking hctz 25 mg daily and BP at goal. Checking CMP and adjust as needed.

## 2017-05-13 NOTE — Assessment & Plan Note (Signed)
Checking lipid panel and adjust as needed.  

## 2017-05-13 NOTE — Patient Instructions (Signed)
We have sent in the ear drops to use. 3 drops per ear, 3 times per day for 3 days to clear up the ears.

## 2017-05-13 NOTE — Assessment & Plan Note (Signed)
Checking tsh and adjust synthroid as needed.

## 2017-05-14 DIAGNOSIS — G4733 Obstructive sleep apnea (adult) (pediatric): Secondary | ICD-10-CM | POA: Diagnosis not present

## 2017-05-17 DIAGNOSIS — G4733 Obstructive sleep apnea (adult) (pediatric): Secondary | ICD-10-CM | POA: Diagnosis not present

## 2017-05-25 ENCOUNTER — Encounter: Payer: Self-pay | Admitting: Endocrinology

## 2017-05-25 DIAGNOSIS — E784 Other hyperlipidemia: Secondary | ICD-10-CM | POA: Diagnosis not present

## 2017-05-25 DIAGNOSIS — L408 Other psoriasis: Secondary | ICD-10-CM | POA: Diagnosis not present

## 2017-05-25 DIAGNOSIS — E559 Vitamin D deficiency, unspecified: Secondary | ICD-10-CM | POA: Diagnosis not present

## 2017-05-25 DIAGNOSIS — E668 Other obesity: Secondary | ICD-10-CM | POA: Diagnosis not present

## 2017-05-25 DIAGNOSIS — K635 Polyp of colon: Secondary | ICD-10-CM | POA: Diagnosis not present

## 2017-05-25 DIAGNOSIS — Z6833 Body mass index (BMI) 33.0-33.9, adult: Secondary | ICD-10-CM | POA: Diagnosis not present

## 2017-05-25 DIAGNOSIS — J45991 Cough variant asthma: Secondary | ICD-10-CM | POA: Diagnosis not present

## 2017-05-25 DIAGNOSIS — K429 Umbilical hernia without obstruction or gangrene: Secondary | ICD-10-CM | POA: Diagnosis not present

## 2017-05-25 DIAGNOSIS — R7302 Impaired glucose tolerance (oral): Secondary | ICD-10-CM | POA: Diagnosis not present

## 2017-05-25 DIAGNOSIS — I10 Essential (primary) hypertension: Secondary | ICD-10-CM | POA: Diagnosis not present

## 2017-05-25 DIAGNOSIS — E038 Other specified hypothyroidism: Secondary | ICD-10-CM | POA: Diagnosis not present

## 2017-05-25 DIAGNOSIS — G43909 Migraine, unspecified, not intractable, without status migrainosus: Secondary | ICD-10-CM | POA: Diagnosis not present

## 2017-06-14 ENCOUNTER — Other Ambulatory Visit: Payer: Self-pay | Admitting: Family

## 2017-06-14 DIAGNOSIS — I10 Essential (primary) hypertension: Secondary | ICD-10-CM

## 2017-08-03 DIAGNOSIS — L720 Epidermal cyst: Secondary | ICD-10-CM | POA: Diagnosis not present

## 2017-08-03 DIAGNOSIS — L821 Other seborrheic keratosis: Secondary | ICD-10-CM | POA: Diagnosis not present

## 2017-08-03 DIAGNOSIS — L710 Perioral dermatitis: Secondary | ICD-10-CM | POA: Diagnosis not present

## 2017-08-03 DIAGNOSIS — L812 Freckles: Secondary | ICD-10-CM | POA: Diagnosis not present

## 2017-10-11 ENCOUNTER — Other Ambulatory Visit: Payer: Self-pay | Admitting: Internal Medicine

## 2017-11-24 DIAGNOSIS — I10 Essential (primary) hypertension: Secondary | ICD-10-CM | POA: Diagnosis not present

## 2017-11-24 DIAGNOSIS — K635 Polyp of colon: Secondary | ICD-10-CM | POA: Diagnosis not present

## 2017-11-24 DIAGNOSIS — Z1389 Encounter for screening for other disorder: Secondary | ICD-10-CM | POA: Diagnosis not present

## 2017-11-24 DIAGNOSIS — Z6830 Body mass index (BMI) 30.0-30.9, adult: Secondary | ICD-10-CM | POA: Diagnosis not present

## 2017-11-24 DIAGNOSIS — E559 Vitamin D deficiency, unspecified: Secondary | ICD-10-CM | POA: Diagnosis not present

## 2017-11-24 DIAGNOSIS — E038 Other specified hypothyroidism: Secondary | ICD-10-CM | POA: Diagnosis not present

## 2017-11-24 DIAGNOSIS — R7302 Impaired glucose tolerance (oral): Secondary | ICD-10-CM | POA: Diagnosis not present

## 2017-11-24 DIAGNOSIS — G43909 Migraine, unspecified, not intractable, without status migrainosus: Secondary | ICD-10-CM | POA: Diagnosis not present

## 2017-11-24 DIAGNOSIS — E7849 Other hyperlipidemia: Secondary | ICD-10-CM | POA: Diagnosis not present

## 2017-11-24 DIAGNOSIS — F329 Major depressive disorder, single episode, unspecified: Secondary | ICD-10-CM | POA: Diagnosis not present

## 2017-11-24 DIAGNOSIS — E668 Other obesity: Secondary | ICD-10-CM | POA: Diagnosis not present

## 2017-12-16 DIAGNOSIS — M47816 Spondylosis without myelopathy or radiculopathy, lumbar region: Secondary | ICD-10-CM | POA: Diagnosis not present

## 2017-12-16 DIAGNOSIS — M549 Dorsalgia, unspecified: Secondary | ICD-10-CM | POA: Diagnosis not present

## 2017-12-16 DIAGNOSIS — M533 Sacrococcygeal disorders, not elsewhere classified: Secondary | ICD-10-CM | POA: Diagnosis not present

## 2017-12-16 DIAGNOSIS — M4316 Spondylolisthesis, lumbar region: Secondary | ICD-10-CM | POA: Diagnosis not present

## 2017-12-20 ENCOUNTER — Other Ambulatory Visit: Payer: Self-pay | Admitting: Rehabilitation

## 2017-12-20 DIAGNOSIS — M533 Sacrococcygeal disorders, not elsewhere classified: Secondary | ICD-10-CM

## 2018-01-01 ENCOUNTER — Ambulatory Visit
Admission: RE | Admit: 2018-01-01 | Discharge: 2018-01-01 | Disposition: A | Discharge status: Home / Self Care | Payer: Medicare Other | Source: Ambulatory Visit | Attending: Rehabilitation | Admitting: Rehabilitation

## 2018-01-01 DIAGNOSIS — M533 Sacrococcygeal disorders, not elsewhere classified: Secondary | ICD-10-CM

## 2018-01-06 DIAGNOSIS — M7918 Myalgia, other site: Secondary | ICD-10-CM | POA: Diagnosis not present

## 2018-01-06 DIAGNOSIS — M533 Sacrococcygeal disorders, not elsewhere classified: Secondary | ICD-10-CM | POA: Diagnosis not present

## 2018-01-06 DIAGNOSIS — M779 Enthesopathy, unspecified: Secondary | ICD-10-CM | POA: Diagnosis not present

## 2018-01-06 DIAGNOSIS — M5136 Other intervertebral disc degeneration, lumbar region: Secondary | ICD-10-CM | POA: Diagnosis not present

## 2018-01-06 DIAGNOSIS — M461 Sacroiliitis, not elsewhere classified: Secondary | ICD-10-CM | POA: Diagnosis not present

## 2018-01-20 ENCOUNTER — Telehealth: Payer: Self-pay | Admitting: Emergency Medicine

## 2018-01-20 NOTE — Telephone Encounter (Signed)
Called patient to schedule AWV. Patient declined at this time. 

## 2018-02-21 DIAGNOSIS — B0052 Herpesviral keratitis: Secondary | ICD-10-CM | POA: Diagnosis not present

## 2018-02-22 DIAGNOSIS — B0052 Herpesviral keratitis: Secondary | ICD-10-CM | POA: Diagnosis not present

## 2018-03-01 DIAGNOSIS — B0052 Herpesviral keratitis: Secondary | ICD-10-CM | POA: Diagnosis not present

## 2018-03-07 ENCOUNTER — Other Ambulatory Visit: Payer: Self-pay | Admitting: Internal Medicine

## 2018-04-07 DIAGNOSIS — Z124 Encounter for screening for malignant neoplasm of cervix: Secondary | ICD-10-CM | POA: Diagnosis not present

## 2018-04-07 DIAGNOSIS — Z1231 Encounter for screening mammogram for malignant neoplasm of breast: Secondary | ICD-10-CM | POA: Diagnosis not present

## 2018-04-07 DIAGNOSIS — Z1272 Encounter for screening for malignant neoplasm of vagina: Secondary | ICD-10-CM | POA: Diagnosis not present

## 2018-04-07 DIAGNOSIS — Z6833 Body mass index (BMI) 33.0-33.9, adult: Secondary | ICD-10-CM | POA: Diagnosis not present

## 2018-05-13 DIAGNOSIS — H52203 Unspecified astigmatism, bilateral: Secondary | ICD-10-CM | POA: Diagnosis not present

## 2018-05-13 DIAGNOSIS — B0052 Herpesviral keratitis: Secondary | ICD-10-CM | POA: Diagnosis not present

## 2018-06-05 ENCOUNTER — Other Ambulatory Visit: Payer: Self-pay | Admitting: Internal Medicine

## 2018-06-05 DIAGNOSIS — I10 Essential (primary) hypertension: Secondary | ICD-10-CM

## 2018-06-07 DIAGNOSIS — G4733 Obstructive sleep apnea (adult) (pediatric): Secondary | ICD-10-CM | POA: Diagnosis not present

## 2018-06-09 DIAGNOSIS — Z23 Encounter for immunization: Secondary | ICD-10-CM | POA: Diagnosis not present

## 2018-06-19 DIAGNOSIS — G4733 Obstructive sleep apnea (adult) (pediatric): Secondary | ICD-10-CM | POA: Diagnosis not present

## 2018-06-24 DIAGNOSIS — Z6833 Body mass index (BMI) 33.0-33.9, adult: Secondary | ICD-10-CM | POA: Diagnosis not present

## 2018-06-24 DIAGNOSIS — R7302 Impaired glucose tolerance (oral): Secondary | ICD-10-CM | POA: Diagnosis not present

## 2018-06-24 DIAGNOSIS — F3289 Other specified depressive episodes: Secondary | ICD-10-CM | POA: Diagnosis not present

## 2018-06-24 DIAGNOSIS — E7849 Other hyperlipidemia: Secondary | ICD-10-CM | POA: Diagnosis not present

## 2018-06-24 DIAGNOSIS — E559 Vitamin D deficiency, unspecified: Secondary | ICD-10-CM | POA: Diagnosis not present

## 2018-06-24 DIAGNOSIS — I10 Essential (primary) hypertension: Secondary | ICD-10-CM | POA: Diagnosis not present

## 2018-06-24 DIAGNOSIS — E038 Other specified hypothyroidism: Secondary | ICD-10-CM | POA: Diagnosis not present

## 2018-06-24 DIAGNOSIS — K635 Polyp of colon: Secondary | ICD-10-CM | POA: Diagnosis not present

## 2018-07-22 ENCOUNTER — Other Ambulatory Visit: Payer: Self-pay | Admitting: Internal Medicine

## 2018-07-22 DIAGNOSIS — I10 Essential (primary) hypertension: Secondary | ICD-10-CM

## 2018-08-01 ENCOUNTER — Encounter: Payer: Self-pay | Admitting: Internal Medicine

## 2018-08-16 DIAGNOSIS — M1712 Unilateral primary osteoarthritis, left knee: Secondary | ICD-10-CM | POA: Diagnosis not present

## 2018-08-16 DIAGNOSIS — G8929 Other chronic pain: Secondary | ICD-10-CM | POA: Diagnosis not present

## 2018-09-26 ENCOUNTER — Other Ambulatory Visit: Payer: Self-pay | Admitting: Orthopedic Surgery

## 2018-09-30 DIAGNOSIS — G43909 Migraine, unspecified, not intractable, without status migrainosus: Secondary | ICD-10-CM | POA: Diagnosis not present

## 2018-09-30 DIAGNOSIS — Z6833 Body mass index (BMI) 33.0-33.9, adult: Secondary | ICD-10-CM | POA: Diagnosis not present

## 2018-09-30 DIAGNOSIS — I10 Essential (primary) hypertension: Secondary | ICD-10-CM | POA: Diagnosis not present

## 2018-10-11 DIAGNOSIS — G4733 Obstructive sleep apnea (adult) (pediatric): Secondary | ICD-10-CM | POA: Diagnosis not present

## 2018-10-17 ENCOUNTER — Ambulatory Visit: Admit: 2018-10-17 | Payer: Medicare Other | Admitting: Orthopedic Surgery

## 2018-10-17 SURGERY — ARTHROPLASTY, KNEE, TOTAL
Anesthesia: Spinal | Laterality: Left

## 2018-12-16 DIAGNOSIS — E119 Type 2 diabetes mellitus without complications: Secondary | ICD-10-CM | POA: Diagnosis not present

## 2018-12-16 DIAGNOSIS — E559 Vitamin D deficiency, unspecified: Secondary | ICD-10-CM | POA: Diagnosis not present

## 2018-12-16 DIAGNOSIS — I10 Essential (primary) hypertension: Secondary | ICD-10-CM | POA: Diagnosis not present

## 2018-12-16 DIAGNOSIS — L409 Psoriasis, unspecified: Secondary | ICD-10-CM | POA: Diagnosis not present

## 2018-12-16 DIAGNOSIS — K635 Polyp of colon: Secondary | ICD-10-CM | POA: Diagnosis not present

## 2018-12-16 DIAGNOSIS — E039 Hypothyroidism, unspecified: Secondary | ICD-10-CM | POA: Diagnosis not present

## 2018-12-16 DIAGNOSIS — E669 Obesity, unspecified: Secondary | ICD-10-CM | POA: Diagnosis not present

## 2018-12-16 DIAGNOSIS — R7302 Impaired glucose tolerance (oral): Secondary | ICD-10-CM | POA: Diagnosis not present

## 2018-12-16 DIAGNOSIS — E785 Hyperlipidemia, unspecified: Secondary | ICD-10-CM | POA: Diagnosis not present

## 2018-12-16 DIAGNOSIS — F329 Major depressive disorder, single episode, unspecified: Secondary | ICD-10-CM | POA: Diagnosis not present

## 2018-12-16 DIAGNOSIS — G43909 Migraine, unspecified, not intractable, without status migrainosus: Secondary | ICD-10-CM | POA: Diagnosis not present

## 2019-04-10 ENCOUNTER — Other Ambulatory Visit: Payer: Self-pay | Admitting: Orthopedic Surgery

## 2019-04-11 ENCOUNTER — Encounter (HOSPITAL_COMMUNITY): Payer: Self-pay

## 2019-04-11 NOTE — Patient Instructions (Addendum)
DUE TO COVID-19 ONLY ONE VISITOR IS ALLOWED TO VISIT DURING VISITOR HOURS ONLY!!   COVID SWAB TESTING MUST BE COMPLETED ON: Thursday, Aug. 13, 2020 at [place], [place] [facility] enter pre surgical testing line (Must self quarantine after testing. Follow instructions on handout.)             Your procedure is scheduled on: Monday, Aug. 17, 2020   Report to [facility] Main  Entrance    Report to admitting at 8:45 AM   Call this number if you have problems the morning of surgery [phone]   Bring Mouth Guard day of surgery   Do not eat food:After Midnight.   May have liquids until 8:15AM day of surgery   CLEAR LIQUID DIET  Foods Allowed                                                                     Foods Excluded  Water, Black Coffee and tea, regular and decaf                             liquids that you cannot  Plain Jell-O in any flavor  (No red)                                           see through such as: Fruit ices (not with fruit pulp)                                     milk, soups, orange juice  Iced Popsicles (No red)                                    All solid food Carbonated beverages, regular and diet                                    Apple juices Sports drinks like Gatorade (No red) Lightly seasoned clear broth or consume(fat free) Sugar, honey syrup  Sample Menu Breakfast                                Lunch                                     Supper Cranberry juice                    Beef broth                            Chicken broth Jell-O  Grape juice                           Apple juice Coffee or tea                        Jell-O                                      Popsicle                                                Coffee or tea                        Coffee or tea   Complete one G2 drink the morning of surgery at 8:15AM the day of surgery.   Brush your  teeth the morning of surgery.   Do NOT smoke after Midnight   Take these medicines the morning of surgery with A SIP OF WATER: Levothyroxine, Lorazepam if needed                               You may not have any metal on your body including hair pins, jewelry, and body piercings             Do not wear make-up, lotions, powders, perfumes/cologne, or deodorant             Do not wear nail polish.  Do not shave  48 hours prior to surgery.              Do not bring valuables to the hospital. [place].   Contacts, dentures or bridgework may not be worn into surgery.   Bring small overnight bag day of surgery.    Special Instructions: Bring a copy of your healthcare power of attorney and living will documents         the day of surgery if you haven't scanned them in before.              Please read over the following fact sheets you were given:  [facility] - Preparing for Surgery Before surgery, you can play an important role.  Because skin is not sterile, your skin needs to be as free of germs as possible.  You can reduce the number of germs on your skin by washing with CHG (chlorahexidine gluconate) soap before surgery.  CHG is an antiseptic cleaner which kills germs and bonds with the skin to continue killing germs even after washing. Please DO NOT use if you have an allergy to CHG or antibacterial soaps.  If your skin becomes reddened/irritated stop using the CHG and inform your nurse when you arrive at Short Stay. Do not shave (including legs and underarms) for at least 48 hours prior to the first CHG shower.  You may shave your face/neck.  Please follow these instructions carefully:  1.  Shower with CHG Soap the night before surgery and the  morning of surgery.  2.  If you choose to wash  your hair, wash your hair first as usual with your normal  shampoo.  3.  After you shampoo, rinse your hair and body thoroughly to remove the shampoo.                              4.  Use CHG as you would any other liquid soap.  You can apply chg directly to the skin and wash.  Gently with a scrungie or clean washcloth.  5.  Apply the CHG Soap to your body ONLY FROM THE NECK DOWN.   Do   not use on face/ open                           Wound or open sores. Avoid contact with eyes, ears mouth and   genitals (private parts).                       Wash face,  Genitals (private parts) with your normal soap.             6.  Wash thoroughly, paying special attention to the area where your    surgery  will be performed.  7.  Thoroughly rinse your body with warm water from the neck down.  8.  DO NOT shower/wash with your normal soap after using and rinsing off the CHG Soap.                9.  Pat yourself dry with a clean towel.            10.  Wear clean pajamas.            11.  Place clean sheets on your bed the night of your first shower and do not  sleep with pets. Day of Surgery : Do not apply any lotions/deodorants the morning of surgery.  Please wear clean clothes to the hospital/surgery center.  FAILURE TO FOLLOW THESE INSTRUCTIONS MAY RESULT IN THE CANCELLATION OF YOUR SURGERY  PATIENT SIGNATURE_________________________________  NURSE SIGNATURE__________________________________  ________________________________________________________________________   Adam Phenix  An incentive spirometer is a tool that can help keep your lungs clear and active. This tool measures how well you are filling your lungs with each breath. Taking long deep breaths may help reverse or decrease the chance of developing breathing (pulmonary) problems (especially infection) following:  A long period of time when you are unable to move or be active. BEFORE THE PROCEDURE   If the spirometer includes an indicator to show your best effort, your nurse or respiratory therapist will set it to a desired goal.  If possible, sit up straight or lean slightly  forward. Try not to slouch.  Hold the incentive spirometer in an upright position. INSTRUCTIONS FOR USE  1. Sit on the edge of your bed if possible, or sit up as far as you can in bed or on a chair. 2. Hold the incentive spirometer in an upright position. 3. Breathe out normally. 4. Place the mouthpiece in your mouth and seal your lips tightly around it. 5. Breathe in slowly and as deeply as possible, raising the piston or the ball toward the top of the column. 6. Hold your breath for 3-5 seconds or for as long as possible. Allow the piston or ball to fall to the bottom of the column. 7. Remove the mouthpiece from your mouth and breathe out normally. 8.  Rest for a few seconds and repeat Steps 1 through 7 at least 10 times every 1-2 hours when you are awake. Take your time and take a few normal breaths between deep breaths. 9. The spirometer may include an indicator to show your best effort. Use the indicator as a goal to work toward during each repetition. 10. After each set of 10 deep breaths, practice coughing to be sure your lungs are clear. If you have an incision (the cut made at the time of surgery), support your incision when coughing by placing a pillow or rolled up towels firmly against it. Once you are able to get out of bed, walk around indoors and cough well. You may stop using the incentive spirometer when instructed by your caregiver.  RISKS AND COMPLICATIONS  Take your time so you do not get dizzy or light-headed.  If you are in pain, you may need to take or ask for pain medication before doing incentive spirometry. It is harder to take a deep breath if you are having pain. AFTER USE  Rest and breathe slowly and easily.  It can be helpful to keep track of a log of your progress. Your caregiver can provide you with a simple table to help with this. If you are using the spirometer at home, follow these instructions: [place] IF:   You are having difficultly using the  spirometer.  You have trouble using the spirometer as often as instructed.  Your pain medication is not giving enough relief while using the spirometer.  You develop fever of 100.5 F (38.1 C) or higher. SEEK IMMEDIATE MEDICAL CARE IF:   You cough up bloody sputum that had not been present before.  You develop fever of 102 F (38.9 C) or greater.  You develop worsening pain at or near the incision site. MAKE SURE YOU:   Understand these instructions.  Will watch your condition.  Will get help right away if you are not doing well or get worse. Document Released: 12/28/2006 Document Revised: 11/09/2011 Document Reviewed: 02/28/2007 [facility] Patient Information 2014 [place], [place].   ________________________________________________________________________

## 2019-04-12 ENCOUNTER — Encounter (HOSPITAL_COMMUNITY): Payer: Self-pay

## 2019-04-12 ENCOUNTER — Encounter (HOSPITAL_COMMUNITY)
Admission: RE | Admit: 2019-04-12 | Discharge: 2019-04-12 | Disposition: A | Discharge status: Home / Self Care | Payer: Medicare Other | Source: Ambulatory Visit | Attending: Orthopedic Surgery | Admitting: Orthopedic Surgery

## 2019-04-12 ENCOUNTER — Other Ambulatory Visit: Payer: Self-pay

## 2019-04-12 DIAGNOSIS — Z20828 Contact with and (suspected) exposure to other viral communicable diseases: Secondary | ICD-10-CM | POA: Insufficient documentation

## 2019-04-12 DIAGNOSIS — M1712 Unilateral primary osteoarthritis, left knee: Secondary | ICD-10-CM | POA: Insufficient documentation

## 2019-04-12 DIAGNOSIS — Z01818 Encounter for other preprocedural examination: Secondary | ICD-10-CM | POA: Diagnosis not present

## 2019-04-12 HISTORY — DX: Migraine, unspecified, not intractable, without status migrainosus: G43.909

## 2019-04-12 HISTORY — DX: Other chronic pain: G89.29

## 2019-04-12 HISTORY — DX: Obesity, unspecified: E66.9

## 2019-04-12 HISTORY — DX: Hypothyroidism, unspecified: E03.9

## 2019-04-12 HISTORY — DX: Personal history of other diseases of the nervous system and sense organs: Z86.69

## 2019-04-12 HISTORY — DX: Unspecified osteoarthritis, unspecified site: M19.90

## 2019-04-12 HISTORY — DX: Psoriasis, unspecified: L40.9

## 2019-04-12 HISTORY — DX: Pneumonia, unspecified organism: J18.9

## 2019-04-12 HISTORY — DX: Type 2 diabetes mellitus without complications: E11.9

## 2019-04-12 HISTORY — DX: Sleep apnea, unspecified: G47.30

## 2019-04-12 LAB — CBC WITH DIFFERENTIAL/PLATELET
Abs Immature Granulocytes: 0.02 10*3/uL (ref 0.00–0.07)
Basophils Absolute: 0.1 10*3/uL (ref 0.0–0.1)
Basophils Relative: 1 %
Eosinophils Absolute: 0.2 10*3/uL (ref 0.0–0.5)
Eosinophils Relative: 4 %
HCT: 39.3 % (ref 36.0–46.0)
Hemoglobin: 13.3 g/dL (ref 12.0–15.0)
Immature Granulocytes: 0 %
Lymphocytes Relative: 29 %
Lymphs Abs: 1.7 10*3/uL (ref 0.7–4.0)
MCH: 31.7 pg (ref 26.0–34.0)
MCHC: 33.8 g/dL (ref 30.0–36.0)
MCV: 93.6 fL (ref 80.0–100.0)
Monocytes Absolute: 0.5 10*3/uL (ref 0.1–1.0)
Monocytes Relative: 8 %
Neutro Abs: 3.4 10*3/uL (ref 1.7–7.7)
Neutrophils Relative %: 58 %
Platelets: 224 10*3/uL (ref 150–400)
RBC: 4.2 MIL/uL (ref 3.87–5.11)
RDW: 12.5 % (ref 11.5–15.5)
WBC: 6 10*3/uL (ref 4.0–10.5)
nRBC: 0 % (ref 0.0–0.2)

## 2019-04-12 LAB — COMPREHENSIVE METABOLIC PANEL
ALT: 22 U/L (ref 0–44)
AST: 22 U/L (ref 15–41)
Albumin: 4.1 g/dL (ref 3.5–5.0)
Alkaline Phosphatase: 49 U/L (ref 38–126)
Anion gap: 10 (ref 5–15)
BUN: 22 mg/dL (ref 8–23)
CO2: 25 mmol/L (ref 22–32)
Calcium: 8.7 mg/dL — ABNORMAL LOW (ref 8.9–10.3)
Chloride: 101 mmol/L (ref 98–111)
Creatinine, Ser: 0.74 mg/dL (ref 0.44–1.00)
GFR calc Af Amer: >60 mL/min (ref >60)
GFR calc non Af Amer: >60 mL/min (ref >60)
Glucose, Bld: 100 mg/dL — ABNORMAL HIGH (ref 70–99)
Potassium: 4.3 mmol/L (ref 3.5–5.1)
Sodium: 136 mmol/L (ref 135–145)
Total Bilirubin: 0.4 mg/dL (ref 0.3–1.2)
Total Protein: 7.4 g/dL (ref 6.5–8.1)

## 2019-04-12 LAB — HEMOGLOBIN A1C
Hgb A1c MFr Bld: 5.5 % (ref 4.8–5.6)
Mean Plasma Glucose: 111.15 mg/dL

## 2019-04-12 LAB — SURGICAL PCR SCREEN
MRSA, PCR: NEGATIVE
Staphylococcus aureus: NEGATIVE

## 2019-04-12 NOTE — Progress Notes (Signed)
SPOKE W/  [place]     SCREENING SYMPTOMS OF COVID 19:   COUGH--NO  RUNNY NOSE--- NO  SORE THROAT---NO  NASAL CONGESTION----NO  SNEEZING----NO  SHORTNESS OF BREATH---NO  DIFFICULTY BREATHING---NO  TEMP >100.0 -----NO  UNEXPLAINED BODY ACHES------NO  CHILLS -------- NO  HEADACHES ---------NO  LOSS OF SMELL/ TASTE --------NO    HAVE YOU OR ANY FAMILY MEMBER TRAVELLED PAST 14 DAYS OUT OF THE   COUNTY---NO STATE----NO COUNTRY----NO  HAVE YOU OR ANY FAMILY MEMBER BEEN EXPOSED TO ANYONE WITH COVID 19? NO

## 2019-04-13 ENCOUNTER — Other Ambulatory Visit (HOSPITAL_COMMUNITY): Payer: Medicare Other

## 2019-04-14 ENCOUNTER — Other Ambulatory Visit (HOSPITAL_COMMUNITY)
Admission: RE | Admit: 2019-04-14 | Discharge: 2019-04-14 | Disposition: A | Discharge status: Home / Self Care | Payer: Medicare Other | Source: Ambulatory Visit | Attending: Orthopedic Surgery | Admitting: Orthopedic Surgery

## 2019-04-14 ENCOUNTER — Other Ambulatory Visit: Payer: Self-pay | Admitting: Orthopedic Surgery

## 2019-04-14 DIAGNOSIS — M1712 Unilateral primary osteoarthritis, left knee: Secondary | ICD-10-CM | POA: Diagnosis not present

## 2019-04-14 DIAGNOSIS — Z01818 Encounter for other preprocedural examination: Secondary | ICD-10-CM | POA: Diagnosis not present

## 2019-04-14 DIAGNOSIS — Z20828 Contact with and (suspected) exposure to other viral communicable diseases: Secondary | ICD-10-CM | POA: Diagnosis not present

## 2019-04-14 LAB — SARS CORONAVIRUS 2 (TAT 6-24 HRS): SARS Coronavirus 2: NEGATIVE

## 2019-04-14 NOTE — Care Plan (Signed)
D/C home with family straight to outpatient PT @ [facility] Appt 8/24.

## 2019-04-16 MED ORDER — BUPIVACAINE LIPOSOME 1.3 % IJ SUSP
20.0000 mL | Freq: Once | INTRAMUSCULAR | Status: AC
  Filled 2019-04-16: qty 20

## 2019-04-17 ENCOUNTER — Ambulatory Visit (HOSPITAL_COMMUNITY): Payer: Medicare Other | Admitting: Anesthesiology

## 2019-04-17 ENCOUNTER — Ambulatory Visit (HOSPITAL_COMMUNITY): Payer: Medicare Other | Admitting: Physician Assistant

## 2019-04-17 ENCOUNTER — Observation Stay (HOSPITAL_COMMUNITY)
Admission: RE | Admit: 2019-04-17 | Discharge: 2019-04-18 | Disposition: A | Discharge status: Home / Self Care | Payer: Medicare Other | Source: Other Acute Inpatient Hospital | Attending: Orthopedic Surgery | Admitting: Orthopedic Surgery

## 2019-04-17 ENCOUNTER — Encounter (HOSPITAL_COMMUNITY)
Admission: RE | Disposition: A | Discharge status: Home / Self Care | Payer: Self-pay | Source: Other Acute Inpatient Hospital | Attending: Orthopedic Surgery

## 2019-04-17 ENCOUNTER — Other Ambulatory Visit: Payer: Self-pay

## 2019-04-17 ENCOUNTER — Encounter (HOSPITAL_COMMUNITY): Payer: Self-pay | Admitting: *Deleted

## 2019-04-17 DIAGNOSIS — Z6834 Body mass index (BMI) 34.0-34.9, adult: Secondary | ICD-10-CM | POA: Diagnosis not present

## 2019-04-17 DIAGNOSIS — M1712 Unilateral primary osteoarthritis, left knee: Principal | ICD-10-CM | POA: Insufficient documentation

## 2019-04-17 DIAGNOSIS — J45909 Unspecified asthma, uncomplicated: Secondary | ICD-10-CM | POA: Diagnosis not present

## 2019-04-17 DIAGNOSIS — Z96659 Presence of unspecified artificial knee joint: Secondary | ICD-10-CM

## 2019-04-17 DIAGNOSIS — E119 Type 2 diabetes mellitus without complications: Secondary | ICD-10-CM | POA: Insufficient documentation

## 2019-04-17 DIAGNOSIS — G43909 Migraine, unspecified, not intractable, without status migrainosus: Secondary | ICD-10-CM | POA: Insufficient documentation

## 2019-04-17 DIAGNOSIS — I10 Essential (primary) hypertension: Secondary | ICD-10-CM | POA: Diagnosis not present

## 2019-04-17 DIAGNOSIS — Z1159 Encounter for screening for other viral diseases: Secondary | ICD-10-CM | POA: Diagnosis not present

## 2019-04-17 DIAGNOSIS — G8918 Other acute postprocedural pain: Secondary | ICD-10-CM | POA: Diagnosis not present

## 2019-04-17 DIAGNOSIS — Z79899 Other long term (current) drug therapy: Secondary | ICD-10-CM | POA: Diagnosis not present

## 2019-04-17 DIAGNOSIS — F329 Major depressive disorder, single episode, unspecified: Secondary | ICD-10-CM | POA: Diagnosis not present

## 2019-04-17 DIAGNOSIS — G4730 Sleep apnea, unspecified: Secondary | ICD-10-CM | POA: Insufficient documentation

## 2019-04-17 HISTORY — PX: TOTAL KNEE ARTHROPLASTY: SHX125

## 2019-04-17 SURGERY — ARTHROPLASTY, KNEE, TOTAL
Anesthesia: Spinal | Laterality: Left

## 2019-04-17 MED ORDER — BUPIVACAINE-EPINEPHRINE (PF) 0.5% -1:200000 IJ SOLN
INTRAMUSCULAR | Status: AC
  Filled 2019-04-17: qty 30

## 2019-04-17 MED ORDER — BUPIVACAINE-EPINEPHRINE 0.5% -1:200000 IJ SOLN
INTRAMUSCULAR | Status: DC | PRN
  Administered 2019-04-17: 30 mL

## 2019-04-17 MED ORDER — BUPIVACAINE LIPOSOME 1.3 % IJ SUSP
INTRAMUSCULAR | Status: DC | PRN
  Administered 2019-04-17: 20 mL

## 2019-04-17 MED ORDER — WATER FOR IRRIGATION, STERILE IR SOLN
Status: DC | PRN
  Administered 2019-04-17: 2000 mL

## 2019-04-17 MED ORDER — PHENOL 1.4 % MT LIQD
1.0000 | OROMUCOSAL | Status: DC | PRN
  Filled 2019-04-17: qty 177

## 2019-04-17 MED ORDER — MIDAZOLAM HCL 2 MG/2ML IJ SOLN
1.0000 mg | INTRAMUSCULAR | Status: DC
  Administered 2019-04-17: 1 mg via INTRAVENOUS
  Filled 2019-04-17: qty 2

## 2019-04-17 MED ORDER — HYDROCHLOROTHIAZIDE 12.5 MG PO CAPS
12.5000 mg | ORAL_CAPSULE | Freq: Every day | ORAL | Status: DC
  Administered 2019-04-17: 12.5 mg via ORAL
  Filled 2019-04-17: qty 1

## 2019-04-17 MED ORDER — DEXAMETHASONE SODIUM PHOSPHATE 10 MG/ML IJ SOLN
8.0000 mg | Freq: Once | INTRAMUSCULAR | Status: AC
  Administered 2019-04-17: 8 mg via INTRAVENOUS

## 2019-04-17 MED ORDER — TRANEXAMIC ACID-NACL 1000-0.7 MG/100ML-% IV SOLN
1000.0000 mg | INTRAVENOUS | Status: AC
  Administered 2019-04-17: 1000 mg via INTRAVENOUS
  Filled 2019-04-17: qty 100

## 2019-04-17 MED ORDER — METOCLOPRAMIDE HCL 5 MG/ML IJ SOLN: 5.0000 mg | Freq: Three times a day (TID) | INTRAMUSCULAR | Status: DC | PRN

## 2019-04-17 MED ORDER — LEVOTHYROXINE SODIUM 25 MCG PO TABS: 68.5000 ug | ORAL_TABLET | ORAL | Status: DC

## 2019-04-17 MED ORDER — TRANEXAMIC ACID-NACL 1000-0.7 MG/100ML-% IV SOLN
1000.0000 mg | Freq: Once | INTRAVENOUS | Status: AC
  Administered 2019-04-17: 1000 mg via INTRAVENOUS
  Filled 2019-04-17: qty 100

## 2019-04-17 MED ORDER — DEXAMETHASONE SODIUM PHOSPHATE 10 MG/ML IJ SOLN
10.0000 mg | Freq: Once | INTRAMUSCULAR | Status: AC
  Administered 2019-04-18: 10 mg via INTRAVENOUS
  Filled 2019-04-17: qty 1

## 2019-04-17 MED ORDER — TRAMADOL HCL 50 MG PO TABS
50.0000 mg | ORAL_TABLET | Freq: Four times a day (QID) | ORAL | Status: DC
  Administered 2019-04-17 – 2019-04-18 (×3): 50 mg via ORAL
  Filled 2019-04-17 (×3): qty 1

## 2019-04-17 MED ORDER — PROPOFOL 500 MG/50ML IV EMUL
INTRAVENOUS | Status: DC | PRN
  Administered 2019-04-17: 75 ug/kg/min via INTRAVENOUS

## 2019-04-17 MED ORDER — ACETAMINOPHEN 500 MG PO TABS
1000.0000 mg | ORAL_TABLET | Freq: Four times a day (QID) | ORAL | Status: DC
  Administered 2019-04-17 – 2019-04-18 (×3): 1000 mg via ORAL
  Filled 2019-04-17 (×3): qty 2

## 2019-04-17 MED ORDER — ONDANSETRON HCL 4 MG/2ML IJ SOLN
INTRAMUSCULAR | Status: AC
  Filled 2019-04-17: qty 2

## 2019-04-17 MED ORDER — CLINDAMYCIN PHOSPHATE 600 MG/50ML IV SOLN
600.0000 mg | Freq: Four times a day (QID) | INTRAVENOUS | Status: AC
  Administered 2019-04-17 (×2): 600 mg via INTRAVENOUS
  Filled 2019-04-17 (×2): qty 50

## 2019-04-17 MED ORDER — PROPOFOL 10 MG/ML IV BOLUS
INTRAVENOUS | Status: AC
  Filled 2019-04-17: qty 20

## 2019-04-17 MED ORDER — POVIDONE-IODINE 10 % EX SWAB: 2.0000 "application " | Freq: Once | CUTANEOUS | Status: DC

## 2019-04-17 MED ORDER — EPHEDRINE SULFATE 50 MG/ML IJ SOLN
INTRAMUSCULAR | Status: DC | PRN
  Administered 2019-04-17: 15 mg via INTRAVENOUS
  Administered 2019-04-17: 5 mg via INTRAVENOUS
  Administered 2019-04-17: 10 mg via INTRAVENOUS

## 2019-04-17 MED ORDER — PROMETHAZINE HCL 25 MG/ML IJ SOLN: 6.2500 mg | INTRAMUSCULAR | Status: DC | PRN

## 2019-04-17 MED ORDER — ONDANSETRON HCL 4 MG PO TABS: 4.0000 mg | ORAL_TABLET | Freq: Four times a day (QID) | ORAL | Status: DC | PRN

## 2019-04-17 MED ORDER — GABAPENTIN 300 MG PO CAPS
300.0000 mg | ORAL_CAPSULE | Freq: Once | ORAL | Status: AC
  Administered 2019-04-17: 300 mg via ORAL
  Filled 2019-04-17: qty 1

## 2019-04-17 MED ORDER — FERROUS SULFATE 325 (65 FE) MG PO TABS
325.0000 mg | ORAL_TABLET | Freq: Three times a day (TID) | ORAL | Status: DC
  Administered 2019-04-17 – 2019-04-18 (×2): 325 mg via ORAL
  Filled 2019-04-17 (×2): qty 1

## 2019-04-17 MED ORDER — ASPIRIN EC 325 MG PO TBEC
325.0000 mg | DELAYED_RELEASE_TABLET | Freq: Two times a day (BID) | ORAL | Status: DC
  Administered 2019-04-18: 325 mg via ORAL
  Filled 2019-04-17: qty 1

## 2019-04-17 MED ORDER — BISACODYL 5 MG PO TBEC: 5.0000 mg | DELAYED_RELEASE_TABLET | Freq: Every day | ORAL | Status: DC | PRN

## 2019-04-17 MED ORDER — SODIUM CHLORIDE 0.9% FLUSH
INTRAVENOUS | Status: DC | PRN
  Administered 2019-04-17: 20 mL

## 2019-04-17 MED ORDER — ONDANSETRON HCL 4 MG/2ML IJ SOLN: 4.0000 mg | Freq: Four times a day (QID) | INTRAMUSCULAR | Status: DC | PRN

## 2019-04-17 MED ORDER — LACTATED RINGERS IV SOLN
INTRAVENOUS | Status: DC
  Administered 2019-04-17 (×3): via INTRAVENOUS

## 2019-04-17 MED ORDER — FLEET ENEMA 7-19 GM/118ML RE ENEM: 1.0000 | ENEMA | Freq: Once | RECTAL | Status: DC | PRN

## 2019-04-17 MED ORDER — DIPHENHYDRAMINE HCL 12.5 MG/5ML PO ELIX: 12.5000 mg | ORAL_SOLUTION | ORAL | Status: DC | PRN

## 2019-04-17 MED ORDER — METHOCARBAMOL 500 MG PO TABS
500.0000 mg | ORAL_TABLET | Freq: Four times a day (QID) | ORAL | Status: DC | PRN
  Administered 2019-04-17: 500 mg via ORAL
  Filled 2019-04-17: qty 1

## 2019-04-17 MED ORDER — ZOLPIDEM TARTRATE 5 MG PO TABS: 5.0000 mg | ORAL_TABLET | Freq: Every evening | ORAL | Status: DC | PRN

## 2019-04-17 MED ORDER — CHLORHEXIDINE GLUCONATE 4 % EX LIQD: 60.0000 mL | Freq: Once | CUTANEOUS | Status: DC

## 2019-04-17 MED ORDER — SODIUM CHLORIDE (PF) 0.9 % IJ SOLN
INTRAMUSCULAR | Status: AC
  Filled 2019-04-17: qty 20

## 2019-04-17 MED ORDER — OLMESARTAN MEDOXOMIL-HCTZ 20-12.5 MG PO TABS: 1.0000 | ORAL_TABLET | Freq: Every day | ORAL | Status: DC

## 2019-04-17 MED ORDER — HYDROMORPHONE HCL 1 MG/ML IJ SOLN
INTRAMUSCULAR | Status: AC
  Filled 2019-04-17: qty 1

## 2019-04-17 MED ORDER — SENNOSIDES-DOCUSATE SODIUM 8.6-50 MG PO TABS: 1.0000 | ORAL_TABLET | Freq: Every evening | ORAL | Status: DC | PRN

## 2019-04-17 MED ORDER — PROGESTERONE MICRONIZED 100 MG PO CAPS
100.0000 mg | ORAL_CAPSULE | Freq: Every day | ORAL | Status: DC
  Administered 2019-04-17: 100 mg via ORAL
  Filled 2019-04-17 (×2): qty 1

## 2019-04-17 MED ORDER — ONDANSETRON HCL 4 MG/2ML IJ SOLN
INTRAMUSCULAR | Status: DC | PRN
  Administered 2019-04-17: 4 mg via INTRAVENOUS

## 2019-04-17 MED ORDER — CELECOXIB 200 MG PO CAPS
400.0000 mg | ORAL_CAPSULE | Freq: Once | ORAL | Status: AC
  Administered 2019-04-17: 09:00:00 400 mg via ORAL
  Filled 2019-04-17: qty 2

## 2019-04-17 MED ORDER — DEXAMETHASONE SODIUM PHOSPHATE 10 MG/ML IJ SOLN
INTRAMUSCULAR | Status: AC
  Filled 2019-04-17: qty 1

## 2019-04-17 MED ORDER — BUPIVACAINE IN DEXTROSE 0.75-8.25 % IT SOLN
INTRATHECAL | Status: DC | PRN
  Administered 2019-04-17: 13.5 mg via INTRATHECAL

## 2019-04-17 MED ORDER — OXYCODONE HCL 5 MG PO TABS
5.0000 mg | ORAL_TABLET | ORAL | Status: DC | PRN
  Administered 2019-04-17 – 2019-04-18 (×4): 10 mg via ORAL
  Filled 2019-04-17 (×4): qty 2

## 2019-04-17 MED ORDER — IRBESARTAN 150 MG PO TABS
150.0000 mg | ORAL_TABLET | Freq: Every day | ORAL | Status: DC
  Administered 2019-04-17: 150 mg via ORAL
  Filled 2019-04-17: qty 1

## 2019-04-17 MED ORDER — LORAZEPAM 1 MG PO TABS: 1.0000 mg | ORAL_TABLET | Freq: Three times a day (TID) | ORAL | Status: DC | PRN

## 2019-04-17 MED ORDER — ALUM & MAG HYDROXIDE-SIMETH 200-200-20 MG/5ML PO SUSP: 30.0000 mL | ORAL | Status: DC | PRN

## 2019-04-17 MED ORDER — 0.9 % SODIUM CHLORIDE (POUR BTL) OPTIME
TOPICAL | Status: DC | PRN
  Administered 2019-04-17: 1000 mL

## 2019-04-17 MED ORDER — MENTHOL 3 MG MT LOZG: 1.0000 | LOZENGE | OROMUCOSAL | Status: DC | PRN

## 2019-04-17 MED ORDER — MIDAZOLAM HCL 2 MG/2ML IJ SOLN: 0.5000 mg | Freq: Once | INTRAMUSCULAR | Status: DC | PRN

## 2019-04-17 MED ORDER — DOCUSATE SODIUM 100 MG PO CAPS
100.0000 mg | ORAL_CAPSULE | Freq: Two times a day (BID) | ORAL | Status: DC
  Administered 2019-04-17 – 2019-04-18 (×2): 100 mg via ORAL
  Filled 2019-04-17 (×2): qty 1

## 2019-04-17 MED ORDER — EPHEDRINE 5 MG/ML INJ
INTRAVENOUS | Status: AC
  Filled 2019-04-17: qty 10

## 2019-04-17 MED ORDER — METHOCARBAMOL 500 MG IVPB - SIMPLE MED
500.0000 mg | Freq: Four times a day (QID) | INTRAVENOUS | Status: DC | PRN
  Filled 2019-04-17: qty 50

## 2019-04-17 MED ORDER — HYDROMORPHONE HCL 1 MG/ML IJ SOLN: 0.5000 mg | INTRAMUSCULAR | Status: DC | PRN

## 2019-04-17 MED ORDER — LEVOTHYROXINE SODIUM 137 MCG PO TABS
137.0000 ug | ORAL_TABLET | ORAL | Status: DC
  Administered 2019-04-18: 137 ug via ORAL
  Filled 2019-04-17: qty 1

## 2019-04-17 MED ORDER — ROPIVACAINE HCL 7.5 MG/ML IJ SOLN
INTRAMUSCULAR | Status: DC | PRN
  Administered 2019-04-17: 10 mL via PERINEURAL

## 2019-04-17 MED ORDER — SODIUM CHLORIDE 0.9 % IR SOLN
Status: DC | PRN
  Administered 2019-04-17: 2000 mL

## 2019-04-17 MED ORDER — PROPOFOL 500 MG/50ML IV EMUL
INTRAVENOUS | Status: DC | PRN
  Administered 2019-04-17: 30 mg via INTRAVENOUS

## 2019-04-17 MED ORDER — GABAPENTIN 300 MG PO CAPS
300.0000 mg | ORAL_CAPSULE | Freq: Three times a day (TID) | ORAL | Status: DC
  Administered 2019-04-17 – 2019-04-18 (×3): 300 mg via ORAL
  Filled 2019-04-17 (×3): qty 1

## 2019-04-17 MED ORDER — HYDROMORPHONE HCL 1 MG/ML IJ SOLN
0.2500 mg | INTRAMUSCULAR | Status: DC | PRN
  Administered 2019-04-17: 0.5 mg via INTRAVENOUS

## 2019-04-17 MED ORDER — FENTANYL CITRATE (PF) 100 MCG/2ML IJ SOLN
50.0000 ug | INTRAMUSCULAR | Status: DC
  Administered 2019-04-17: 50 ug via INTRAVENOUS
  Filled 2019-04-17: qty 2

## 2019-04-17 MED ORDER — MEPERIDINE HCL 50 MG/ML IJ SOLN: 6.2500 mg | INTRAMUSCULAR | Status: DC | PRN

## 2019-04-17 MED ORDER — SODIUM CHLORIDE 0.9 % IV SOLN
INTRAVENOUS | Status: DC
  Administered 2019-04-17 – 2019-04-18 (×2): via INTRAVENOUS

## 2019-04-17 MED ORDER — CLINDAMYCIN PHOSPHATE 900 MG/50ML IV SOLN
900.0000 mg | INTRAVENOUS | Status: AC
  Administered 2019-04-17: 900 mg via INTRAVENOUS
  Filled 2019-04-17: qty 50

## 2019-04-17 MED ORDER — METOCLOPRAMIDE HCL 5 MG PO TABS: 5.0000 mg | ORAL_TABLET | Freq: Three times a day (TID) | ORAL | Status: DC | PRN

## 2019-04-17 MED ORDER — PANTOPRAZOLE SODIUM 40 MG PO TBEC
40.0000 mg | DELAYED_RELEASE_TABLET | Freq: Every day | ORAL | Status: DC
  Administered 2019-04-17 – 2019-04-18 (×2): 40 mg via ORAL
  Filled 2019-04-17 (×2): qty 1

## 2019-04-17 MED ORDER — ACETAMINOPHEN 500 MG PO TABS
1000.0000 mg | ORAL_TABLET | Freq: Once | ORAL | Status: AC
  Administered 2019-04-17: 09:00:00 1000 mg via ORAL
  Filled 2019-04-17: qty 2

## 2019-04-17 SURGICAL SUPPLY — 62 items
ARTISURF 10M PLY L 6-9CD KNEE (Knees) ×1 IMPLANT
BAG SPEC THK2 15X12 ZIP CLS (MISCELLANEOUS) ×1
BAG ZIPLOCK 12X15 (MISCELLANEOUS) ×2 IMPLANT
BLADE SAGITTAL 13X1.27X60 (BLADE) ×2 IMPLANT
BLADE SAW SGTL 83.5X18.5 (BLADE) ×2 IMPLANT
BLADE SURG 15 STRL LF DISP TIS (BLADE) ×1 IMPLANT
BLADE SURG 15 STRL SS (BLADE) ×2
BLADE SURG SZ10 CARB STEEL (BLADE) ×4 IMPLANT
BNDG CMPR MED 10X6 ELC LF (GAUZE/BANDAGES/DRESSINGS) ×1
BNDG ELASTIC 6X10 VLCR STRL LF (GAUZE/BANDAGES/DRESSINGS) ×1 IMPLANT
BNDG ELASTIC 6X5.8 VLCR STR LF (GAUZE/BANDAGES/DRESSINGS) ×2 IMPLANT
BOWL SMART MIX CTS (DISPOSABLE) ×2 IMPLANT
BSPLAT TIB 5D C CMNT STM LT (Knees) ×1 IMPLANT
CEMENT BONE SIMPLEX SPEEDSET (Cement) ×4 IMPLANT
COVER SURGICAL LIGHT HANDLE (MISCELLANEOUS) ×2 IMPLANT
COVER WAND RF STERILE (DRAPES) IMPLANT
CUFF TOURN SGL QUICK 34 (TOURNIQUET CUFF) ×2
CUFF TRNQT CYL 34X4.125X (TOURNIQUET CUFF) ×1 IMPLANT
DECANTER SPIKE VIAL GLASS SM (MISCELLANEOUS) ×4 IMPLANT
DRAPE INCISE IOBAN 66X45 STRL (DRAPES) ×4 IMPLANT
DRAPE U-SHAPE 47X51 STRL (DRAPES) ×2 IMPLANT
DRILL PIN HEADLESS TROCAR 3X75 (PIN) ×1 IMPLANT
DRSG AQUACEL AG ADV 3.5X10 (GAUZE/BANDAGES/DRESSINGS) ×2 IMPLANT
DURAPREP 26ML APPLICATOR (WOUND CARE) ×4 IMPLANT
ELECT REM PT RETURN 15FT ADLT (MISCELLANEOUS) ×2 IMPLANT
FEMUR  CMT CCR STD SZ6 L KNEE (Knees) ×1 IMPLANT
FEMUR CMT CCR STD SZ6 L KNEE (Knees) ×1 IMPLANT
FEMUR CMTD CCR STD SZ6 L KNEE (Knees) IMPLANT
GLOVE BIOGEL M STRL SZ7.5 (GLOVE) ×2 IMPLANT
GLOVE BIOGEL PI IND STRL 7.5 (GLOVE) ×1 IMPLANT
GLOVE BIOGEL PI IND STRL 8.5 (GLOVE) ×2 IMPLANT
GLOVE BIOGEL PI INDICATOR 7.5 (GLOVE) ×1
GLOVE BIOGEL PI INDICATOR 8.5 (GLOVE) ×2
GLOVE SURG ORTHO 8.0 STRL STRW (GLOVE) ×6 IMPLANT
GOWN STRL REUS W/ TWL XL LVL3 (GOWN DISPOSABLE) ×2 IMPLANT
GOWN STRL REUS W/TWL XL LVL3 (GOWN DISPOSABLE) ×4
HANDPIECE INTERPULSE COAX TIP (DISPOSABLE) ×2
HOLDER FOLEY CATH W/STRAP (MISCELLANEOUS) ×2 IMPLANT
HOOD PEEL AWAY FLYTE STAYCOOL (MISCELLANEOUS) ×6 IMPLANT
KIT TURNOVER KIT A (KITS) IMPLANT
MANIFOLD NEPTUNE II (INSTRUMENTS) ×2 IMPLANT
NEEDLE HYPO 22GX1.5 SAFETY (NEEDLE) ×2 IMPLANT
NS IRRIG 1000ML POUR BTL (IV SOLUTION) ×2 IMPLANT
PACK TOTAL KNEE CUSTOM (KITS) ×2 IMPLANT
PROTECTOR NERVE ULNAR (MISCELLANEOUS) ×2 IMPLANT
SET HNDPC FAN SPRY TIP SCT (DISPOSABLE) ×1 IMPLANT
STEM POLY PAT PLY 29M KNEE (Knees) ×1 IMPLANT
STEM TIBIA 5 DEG SZ C L KNEE (Knees) IMPLANT
STRIP CLOSURE SKIN 1/2X4 (GAUZE/BANDAGES/DRESSINGS) ×2 IMPLANT
SUT BONE WAX W31G (SUTURE) ×2 IMPLANT
SUT MNCRL AB 3-0 PS2 18 (SUTURE) ×2 IMPLANT
SUT STRATAFIX 0 PDS 27 VIOLET (SUTURE) ×2
SUT STRATAFIX PDS+ 0 24IN (SUTURE) ×2 IMPLANT
SUT VIC AB 1 CT1 36 (SUTURE) ×3 IMPLANT
SUTURE STRATFX 0 PDS 27 VIOLET (SUTURE) ×1 IMPLANT
SYR CONTROL 10ML LL (SYRINGE) ×4 IMPLANT
TAPE STRIPS DRAPE STRL (GAUZE/BANDAGES/DRESSINGS) ×1 IMPLANT
TIBIA STEM 5 DEG SZ C L KNEE (Knees) ×2 IMPLANT
TRAY FOLEY MTR SLVR 16FR STAT (SET/KITS/TRAYS/PACK) ×2 IMPLANT
WATER STERILE IRR 1000ML POUR (IV SOLUTION) ×4 IMPLANT
WRAP KNEE MAXI GEL POST OP (GAUZE/BANDAGES/DRESSINGS) ×2 IMPLANT
YANKAUER SUCT BULB TIP 10FT TU (MISCELLANEOUS) ×2 IMPLANT

## 2019-04-17 NOTE — Progress Notes (Signed)
Assisted Dr. Carswell Jackson with left, ultrasound guided, adductor canal block. Side rails up, monitors on throughout procedure. See vital signs in flow sheet. Tolerated Procedure well.  

## 2019-04-17 NOTE — Anesthesia Procedure Notes (Signed)
Anesthesia Regional Block: Adductor canal block   Pre-Anesthetic Checklist: ,, timeout performed, Correct Patient, Correct Site, Correct Laterality, Correct Procedure, Correct Position, site marked, Risks and benefits discussed,  Surgical consent,  Pre-op evaluation,  At surgeon's request and post-op pain management  Laterality: Left and Lower  Prep: chloraprep       Needles:  Injection technique: Single-shot  Needle Type: Echogenic Needle     Needle Length: 9cm  Needle Gauge: 21     Additional Needles:   Procedures:,,,, ultrasound used (permanent image in chart),,,,  Narrative:  Start time: 04/17/2019 9:54 AM End time: 04/17/2019 10:01 AM Injection made incrementally with aspirations every 5 mL.  Performed by: Personally  Anesthesiologist: Annye Asa, MD  Additional Notes: Pt identified in Holding room.  Monitors applied. Working IV access confirmed. Sterile prep L thigh.  #21ga ECHOgenic needle into adductor canal with US guidance.  20cc 0.75% Ropivacaine injected incrementally after negative test dose.  Patient asymptomatic, VSS, no heme aspirated, tolerated well.  Jenita Seashore, MD

## 2019-04-17 NOTE — Transfer of Care (Signed)
Immediate Anesthesia Transfer of Care Note  Patient: Caitlin Stevenson  Procedure(s) Performed: TOTAL KNEE ARTHROPLASTY (Left )  Patient Location: PACU  Anesthesia Type:Spinal  Level of Consciousness: awake, alert  and oriented  Airway & Oxygen Therapy: Patient Spontanous Breathing and Patient connected to face mask oxygen  Post-op Assessment: Report given to RN and Post -op Vital signs reviewed and stable  Post vital signs: Reviewed and stable  Last Vitals:  Vitals Value Taken Time  BP    Temp    Pulse 66 04/17/19 1247  Resp 9 04/17/19 1247  SpO2 100 % 04/17/19 1247  Vitals shown include unvalidated device data.  Last Pain:  Vitals:   04/17/19 1030  TempSrc:   PainSc: 0-No pain      Patients Stated Pain Goal: 4 (82/99/37 1696)  Complications: No apparent anesthesia complications

## 2019-04-17 NOTE — Anesthesia Postprocedure Evaluation (Signed)
Anesthesia Post Note  Patient: Caitlin Stevenson  Procedure(s) Performed: TOTAL KNEE ARTHROPLASTY (Left )     Patient location during evaluation: PACU Anesthesia Type: Spinal Level of consciousness: awake and alert, oriented and patient cooperative Pain management: pain level controlled Vital Signs Assessment: post-procedure vital signs reviewed and stable Respiratory status: spontaneous breathing, nonlabored ventilation and respiratory function stable Cardiovascular status: blood pressure returned to baseline and stable Postop Assessment: no apparent nausea or vomiting and spinal receding Anesthetic complications: no    Last Vitals:  Vitals:   04/17/19 1300 04/17/19 1315  BP: 115/71 (!) 144/67  Pulse: (!) 56 (!) 58  Resp: 14 17  Temp:    SpO2: 100% 100%    Last Pain:  Vitals:   04/17/19 1315  TempSrc:   PainSc: 0-No pain    LLE Motor Response: No movement due to regional block (04/17/19 1315) LLE Sensation: Numbness (04/17/19 1315) RLE Motor Response: Non-purposeful movement (04/17/19 1315) RLE Sensation: Numbness (04/17/19 1315) L Sensory Level: L2-Upper inner thigh, upper buttock (04/17/19 1315) R Sensory Level: L2-Upper inner thigh, upper buttock (04/17/19 1315)  Remmy Crass,E. Justis Dupas

## 2019-04-17 NOTE — Anesthesia Procedure Notes (Signed)
Spinal  Patient location during procedure: OR End time: 04/17/2019 10:57 AM Staffing Anesthesiologist: Annye Asa, MD Performed: anesthesiologist  Preanesthetic Checklist Completed: patient identified, site marked, surgical consent, pre-op evaluation, timeout performed, IV checked, risks and benefits discussed and monitors and equipment checked Spinal Block Patient position: sitting Prep: site prepped and draped and DuraPrep Patient monitoring: blood pressure, continuous pulse ox, cardiac monitor and heart rate Approach: midline Location: L3-4 Needle Needle type: Pencan  Needle gauge: 24 G Needle length: 9 cm Additional Notes Pt identified in Operating room.  Monitors applied. Working IV access confirmed. Sterile prep, drape lumbar spine.  1% lido local L 3,4.  #24ga Pencan into clear CSF L 3,4.  13.5mg  0.75% Bupivacaine with dextrose injected with asp CSF beginning and end of injection.  Patient asymptomatic, VSS, no heme aspirated, tolerated well.  Jenita Seashore, MD

## 2019-04-17 NOTE — Care Plan (Signed)
Ortho Bundle Case Management Note  Patient Details  Name: Caitlin Stevenson MRN: [id number] Date of Birth: [birth year]                     DME Arranged:  Continuous passive motion machine, Walker rolling DME Agency:  [employer]  HH Arranged:  NA HH Agency:     Additional Comments: Please contact me with any questions of if this plan should need to change.  Myer Haff, [facility] 04/17/2019, 10:58 AM  Discharge home with family straight to outpatient PT.

## 2019-04-17 NOTE — Op Note (Signed)
TOTAL KNEE REPLACEMENT OPERATIVE NOTE:  04/17/2019  1:00 PM  PATIENT:  Caitlin Stevenson  68 y.o. female  PRE-OPERATIVE DIAGNOSIS:  LT. KNEE [id number]  POST-OPERATIVE DIAGNOSIS:  LT. KNEE OSTEOARTHRITIS  PROCEDURE:  Procedure(s): TOTAL KNEE ARTHROPLASTY  SURGEON:  Surgeon(s): Vickey Huger, MD  PHYSICIAN ASSISTANT:  Nehemiah Massed, PA-C  ANESTHESIA:   spinal  SPECIMEN: None  COUNTS:  Correct  TOURNIQUET:   Total Tourniquet Time Documented: Thigh (Left) - 42 minutes Total: Thigh (Left) - 42 minutes   DICTATION:  Indication for procedure:    The patient is a 68 y.o. female who has failed conservative treatment for LT. KNEE [id number].  Informed consent was obtained prior to anesthesia. The risks versus benefits of the operation were explain and in a way the patient can, and did, understand.    Description of procedure:     The patient was taken to the operating room and placed under anesthesia.  The patient was positioned in the usual fashion taking care that all body parts were adequately padded and/or protected.  A tourniquet was applied and the leg prepped and draped in the usual sterile fashion.  The extremity was exsanguinated with the esmarch and tourniquet inflated to 350 mmHg.  Pre-operative range of motion was normal.    A midline incision approximately 6-7 inches long was made with a #10 blade.  A new blade was used to make a parapatellar arthrotomy going 2-3 cm into the quadriceps tendon, over the patella, and alongside the medial aspect of the patellar tendon.  A synovectomy was then performed with the #10 blade and forceps. I then elevated the deep MCL off the medial tibial metaphysis subperiosteally around to the semimembranosus attachment.    I everted the patella and used calipers to measure patellar thickness.  I used the reamer to ream down to appropriate thickness to recreate the native thickness.  I then removed excess bone with the  rongeur and sagittal saw.  I used the appropriately sized template and drilled the three lug holes.  I then put the trial in place and measured the thickness with the calipers to ensure recreation of the native thickness.  The trial was then removed and the patella subluxed and the knee brought into flexion.  A homan retractor was place to retract and protect the patella and lateral structures.  A Z-retractor was place medially to protect the medial structures.  The extra-medullary alignment system was used to make cut the tibial articular surface perpendicular to the anamotic axis of the tibia and in 3 degrees of posterior slope.  The cut surface and alignment jig was removed.  I then used the intramedullary alignment guide to make a valgus cut on the distal femur.  I then marked out the epicondylar axis on the distal femur.   I then used the anterior referencing sizer and measured the femur to be a size 6.  The 4-In-1 cutting block was screwed into place in external rotation matching the posterior condylar angle, making our cuts perpendicular to the epicondylar axis.  Anterior, posterior and chamfer cuts were made with the sagittal saw.  The cutting block and cut pieces were removed.  A lamina spreader was placed in 90 degrees of flexion.  The ACL, PCL, menisci, and posterior condylar osteophytes were removed.  A 10 mm spacer blocked was found to offer good flexion and extension gap balance after minimal in degree releasing.   The scoop retractor was then placed and the femoral finishing block  was pinned in place.  The small sagittal saw was used as well as the lug drill to finish the femur.  The block and cut surfaces were removed and the medullary canal hole filled with autograft bone from the cut pieces.  The tibia was delivered forward in deep flexion and external rotation.  A size C tray was selected and pinned into place centered on the medial 1/3 of the tibial tubercle.  The reamer and keel was used  to prepare the tibia through the tray.    I then trialed with the size 6 femur, size C tibia, a 10 mm insert and the 29 patella.  I had excellent flexion/extension gap balance, excellent patella tracking.  Flexion was full and beyond 120 degrees; extension was zero.  These components were chosen and the staff opened them to me on the back table while the knee was lavaged copiously and the cement mixed.  The soft tissue was infiltrated with 60cc of exparel 1.3% through a 21 gauge needle.  I cemented in the components and removed all excess cement.  The polyethylene tibial component was snapped into place and the knee placed in extension while cement was hardening.  The capsule was infilltrated with a 60cc exparel/marcaine/saline mixture.   Once the cement was hard, the tourniquet was let down.  Hemostasis was obtained.  The arthrotomy was closed using a #1 stratofix running suture.  The deep soft tissues were closed with #0 vicryls and the subcuticular layer closed with #2-0 vicryl.  The skin was reapproximated and closed with 3.0 Monocryl.  The wound was covered with steristrips, aquacel dressing, and a TED stocking.   The patient was then awakened, extubated, and taken to the recovery room in stable condition.  BLOOD LOSS:  433IR COMPLICATIONS:  None.  PLAN OF CARE: Admit for overnight observation  PATIENT DISPOSITION:  PACU - hemodynamically stable.   Please fax a copy of this op note to my office at [phone] (please only include page 1 and 2 of the Case Information op note)

## 2019-04-17 NOTE — Evaluation (Signed)
Physical Therapy Evaluation Patient Details Name: Caitlin Stevenson MRN: [id number] DOB: December 02, 1950 Today's Date: 04/17/2019   History of Present Illness  s/p L TKA  Clinical Impression  Pt is s/p TKA resulting in the deficits listed below (see PT Problem List). Pt amb ~ 12' with RW and min assist, anticipate steady progress in acute setting. Will continue to follow.  Pt will benefit from skilled PT to increase their independence and safety with mobility to allow discharge to the venue listed below.      Follow Up Recommendations Follow surgeon's recommendation for DC plan and follow-up therapies    Equipment Recommendations  None recommended by PT    Recommendations for Other Services       Precautions / Restrictions Precautions Precautions: Fall;Knee Restrictions Weight Bearing Restrictions: No LLE Weight Bearing: Weight bearing as tolerated      Mobility  Bed Mobility Overal bed mobility: Needs Assistance Bed Mobility: Supine to Sit     Supine to sit: Min guard     General bed mobility comments: for safety, incr time, mild dizziness  Transfers Overall transfer level: Needs assistance Equipment used: Rolling walker (2 wheeled) Transfers: Sit to/from Stand Sit to Stand: Min assist         General transfer comment: cues for hand placement  Ambulation/Gait Ambulation/Gait assistance: Min assist;Min guard Gait Distance (Feet): 55 Feet Assistive device: Rolling walker (2 wheeled) Gait Pattern/deviations: Step-to pattern;Decreased stance time - left     General Gait Details: cues for sequence and RW position from self  Stairs            Wheelchair Mobility    Modified Rankin (Stroke Patients Only)       Balance                                             Pertinent Vitals/Pain Pain Assessment: 0-10 Pain Score: 3  Pain Location: L knee Pain Descriptors / Indicators: Aching;Grimacing Pain Intervention(s): Limited activity  within patient's tolerance;Monitored during session;Premedicated before session    Home Living Family/patient expects to be discharged to:: Private residence Living Arrangements: Alone Available Help at Discharge: Family;Friend(s) Type of Home: House Home Access: Stairs to enter   [employer] of Steps: 1 Home Layout: One level Home Equipment: Environmental consultant - 2 wheels;Bedside commode      Prior Function Level of Independence: Independent               Hand Dominance        Extremity/Trunk Assessment   Upper Extremity Assessment Upper Extremity Assessment: Overall WFL for tasks assessed    Lower Extremity Assessment Lower Extremity Assessment: LLE deficits/detail LLE Deficits / Details: ankle WFL. knee extension and hip flexion 2+/5 to 3/5, limited by post op pain and weakness       Communication      Cognition Arousal/Alertness: Awake/alert Behavior During Therapy: WFL for tasks assessed/performed Overall Cognitive Status: Within Functional Limits for tasks assessed                                        General Comments      Exercises Total Joint Exercises Ankle Circles/Pumps: AROM;Both;10 reps Quad Sets: 5 reps;Both;AROM   Assessment/Plan    PT Assessment Patient needs continued PT services  PT Problem List Decreased strength;Decreased range of motion;Decreased activity tolerance;Decreased mobility;Pain;Decreased knowledge of use of DME       PT Treatment Interventions Stair training;Gait training;DME instruction;Therapeutic exercise;Functional mobility training;Therapeutic activities;Patient/family education    PT Goals (Current goals can be found in the Care Plan section)  Acute Rehab PT Goals Time For Goal Achievement: 04/24/19 Potential to Achieve Goals: Good    Frequency 7X/week   Barriers to discharge        Co-evaluation               AM-PAC PT "6 Clicks" Mobility  Outcome Measure Help needed turning from  your back to your side while in a flat bed without using bedrails?: A Little Help needed moving from lying on your back to sitting on the side of a flat bed without using bedrails?: A Little Help needed moving to and from a bed to a chair (including a wheelchair)?: A Little Help needed standing up from a chair using your arms (e.g., wheelchair or bedside chair)?: A Little Help needed to walk in hospital room?: A Little Help needed climbing 3-5 steps with a railing? : A Lot 6 Click Score: 17    End of Session Equipment Utilized During Treatment: Gait belt Activity Tolerance: Patient tolerated treatment well Patient left: in chair;with call bell/phone within reach;with chair alarm set   PT Visit Diagnosis: Difficulty in walking, not elsewhere classified (R26.2)    Time: 7741-2878 PT Time Calculation (min) (ACUTE ONLY): 23 min   Charges:   PT Evaluation $PT Eval Low Complexity: 1 Low PT Treatments $Gait Training: 8-22 mins        Kenyon Ana, PT  Pager: [phone] Acute Rehab Dept [facility]): [phone]   04/17/2019   [facility] 04/17/2019, 6:35 PM

## 2019-04-17 NOTE — Anesthesia Preprocedure Evaluation (Addendum)
Anesthesia Evaluation  Patient identified by MRN, date of birth, ID band Patient awake    Reviewed: Allergy & Precautions, NPO status , Patient's Chart, lab work & pertinent test results  History of Anesthesia Complications Negative for: history of anesthetic complications  Airway Mallampati: II  TM Distance: >3 FB Neck ROM: Full    Dental  (+) Dental Advisory Given   Pulmonary sleep apnea (uses dental appliance) ,  04/14/2019 SARS coronavirus NEG   breath sounds clear to auscultation       Cardiovascular hypertension, Pt. on medications (-) angina Rhythm:Regular Rate:Normal     Neuro/Psych  Headaches, Depression    GI/Hepatic Neg liver ROS, GERD  Medicated and Controlled,  Endo/Other  diabetes (diet controlled)Hypothyroidism Morbid obesity  Renal/GU negative Renal ROS     Musculoskeletal  (+) Arthritis , Osteoarthritis,    Abdominal (+) + obese,   Peds  Hematology negative hematology ROS (+)   Anesthesia Other Findings   Reproductive/Obstetrics                            Anesthesia Physical Anesthesia Plan  ASA: III  Anesthesia Plan: Spinal   Post-op Pain Management:  Regional for Post-op pain   Induction:   PONV Risk Score and Plan: 2 and Ondansetron  Airway Management Planned: Natural Airway and Simple Face Mask  Additional Equipment:   Intra-op Plan:   Post-operative Plan:   Informed Consent: I have reviewed the patients History and Physical, chart, labs and discussed the procedure including the risks, benefits and alternatives for the proposed anesthesia with the patient or authorized representative who has indicated his/her understanding and acceptance.     Dental advisory given  Plan Discussed with: CRNA and Surgeon  Anesthesia Plan Comments: (Plan routine monitors, SAB with adductor canal block for post op analgesia)       Anesthesia Quick Evaluation

## 2019-04-17 NOTE — H&P (Signed)
Caitlin Stevenson MRN:  [id number] DOB/SEX:  06-23-1951/female  CHIEF COMPLAINT:  Painful left Knee  HISTORY: Patient is a 68 y.o. female presented with a history of pain in the left knee. Onset of symptoms was gradual starting a few years ago with gradually worsening course since that time. Patient has been treated conservatively with over-the-counter NSAIDs and activity modification. Patient currently rates pain in the knee at 10 out of 10 with activity. There is pain at night.  PAST MEDICAL HISTORY: Patient Active Problem List   Diagnosis Date Noted  . Hypothyroidism 04/17/2016  . Morbid obesity due to excess calories ([place]) 12/31/2015  . Ventral hernia 10/20/2015  . Essential hypertension 10/16/2015  . Cough variant asthma vs UACS  10/15/2015  . Diabetes mellitus type 2 in obese ([place]) 05/11/2007  . Hyperlipidemia 05/11/2007  . DEPRESSION 05/11/2007  . ASTHMA 05/11/2007  . Migraine 05/11/2007  . URINARY INCONTINENCE 05/11/2007   Past Medical History:  Diagnosis Date  . Allergy   . Arthritis   . Chronic back pain   . Depression   . Diabetes mellitus without complication ([facility])    diet controlled  . History of carpal tunnel syndrome    Bilateral  . Hypertension   . Hypothyroidism   . Migraine   . Obese   . Pneumonia    x2  . Psoriasis   . Sleep apnea    mouth guard   Past Surgical History:  Procedure Laterality Date  . ABDOMINAL HYSTERECTOMY    . CARPAL TUNNEL RELEASE    . CERVICAL FUSION    . COLONOSCOPY    . FRACTURE SURGERY     tibia fracture  . UPPER GI ENDOSCOPY       MEDICATIONS:   Medications Prior to Admission  Medication Sig Dispense Refill Last Dose  . estradiol (VIVELLE-DOT) 0.1 MG/24HR patch Place 1 patch onto the skin 2 (two) times a week.   04/17/2019 at Unknown time  . halobetasol (ULTRAVATE) 0.05 % ointment Apply topically 2 (two) times daily. (Patient taking differently: Apply 1 application topically 2 (two) times daily as needed (itching). ) 50  g 6 Past Month at Unknown time  . levothyroxine (SYNTHROID, LEVOTHROID) 137 MCG tablet TAKE ONE TABLET BY MOUTH DAILY BEFORE BREAKFAST (Patient taking differently: Take 68.5-137 mcg by mouth See admin instructions. Take 137 mcg by mouth before breakfast Monday - Friday and take 68.5 mcg on Saturday.) 90 tablet 1 04/17/2019 at 0600  . LORazepam (ATIVAN) 1 MG tablet Take 1 mg by mouth 3 (three) times daily as needed for anxiety.   04/17/2019 at 0600  . olmesartan-hydrochlorothiazide (BENICAR HCT) 20-12.5 MG tablet Take 1 tablet by mouth at bedtime.   04/16/2019 at Unknown time  . Omega-3 1000 MG CAPS Take 2,000-3,000 mg by mouth daily.   Past Week at Unknown time  . progesterone (PROMETRIUM) 100 MG capsule Take 100 mg by mouth daily.   04/16/2019 at Unknown time  . SUMAtriptan (IMITREX) 100 MG tablet Take 1 tablet (100 mg total) by mouth every 2 (two) hours as needed for migraine. May repeat in 2 hours if headache persists or recurs. 30 tablet 3 Past Month at Unknown time  . zinc gluconate 50 MG tablet Take 50 mg by mouth daily.   04/16/2019 at Unknown time  . zolpidem (AMBIEN) 10 MG tablet Take 10 mg by mouth at bedtime as needed for sleep.    04/16/2019 at Unknown time  . gabapentin (NEURONTIN) 100 MG capsule Take 1 capsule (  100 mg total) by mouth 3 (three) times daily. One three times daily (Patient not taking: Reported on 10/05/2016) 90 capsule 2   . hydrochlorothiazide (MICROZIDE) 12.5 MG capsule Take 1 capsule (12.5 mg total) by mouth daily. Need annual appointment with labs for further refills (Patient not taking: Reported on 04/10/2019) 30 capsule 0 Not Taking at Unknown time  . levothyroxine (SYNTHROID, LEVOTHROID) 137 MCG tablet Take 1 tablet (137 mcg total) by mouth daily before breakfast. Need annual appointment with labs for further refills (Patient not taking: Reported on 04/10/2019) 30 tablet 0 Not Taking at Unknown time  . montelukast (SINGULAIR) 10 MG tablet TK 1 T PO QPM  2   .  neomycin-polymyxin-hydrocortisone (CORTISPORIN) 3.5-10000-1 OTIC suspension Place 3 drops into both ears 3 (three) times daily. (Patient not taking: Reported on 04/10/2019) 10 mL 0 Not Taking at Unknown time  . pantoprazole (PROTONIX) 40 MG tablet Take 1 tablet (40 mg total) by mouth daily. Take 30-60 min before first meal of the day (Patient not taking: Reported on 10/05/2016) 90 tablet 3   . Respiratory Therapy Supplies (FLUTTER) DEVI Use as directed 1 each 0 Unknown at Unknown time    ALLERGIES:   Allergies  Allergen Reactions  . Atenolol     Esophageal Spasms  . Minocycline Hcl     Migraines and Dizziness  . Morphine Sulfate Nausea And Vomiting  . Amoxicillin Rash    Did it involve swelling of the face/tongue/throat, SOB, or low BP? No Did it involve sudden or severe rash/hives, skin peeling, or any reaction on the inside of your mouth or nose? No Did you need to seek medical attention at a hospital or doctor's office? No When did it last happen?Childhood allergy If all above answers are "NO", may proceed with cephalosporin use.   Marland Kitchen Penicillins Rash    Did it involve swelling of the face/tongue/throat, SOB, or low BP? No Did it involve sudden or severe rash/hives, skin peeling, or any reaction on the inside of your mouth or nose? No Did you need to seek medical attention at a hospital or doctor's office? No When did it last happen?Childhood allergy If all above answers are "NO", may proceed with cephalosporin use.    REVIEW OF SYSTEMS:  A comprehensive review of systems was negative except for: Musculoskeletal: positive for arthralgias and bone pain   FAMILY HISTORY:   Family History  Problem Relation Age of Onset  . Hypertension Mother     SOCIAL HISTORY:   Social History   Tobacco Use  . Smoking status: Never Smoker  . Smokeless tobacco: Never Used  Substance Use Topics  . Alcohol use: No    Alcohol/week: 0.0 standard drinks     EXAMINATION:  Vital  signs in last 24 hours: Temp:  [98.6 F (37 C)] 98.6 F (37 C) (08/17 0805) Pulse Rate:  [67] 67 (08/17 0805) Resp:  [18] 18 (08/17 0805) BP: (116)/(76) 116/76 (08/17 0805) SpO2:  [98 %] 98 % (08/17 0805) Weight:  [94.3 kg] 94.3 kg (08/17 0900)  BP 116/76 (BP Location: Left Arm)   Pulse 67   Temp 98.6 F (37 C) (Oral)   Resp 18   Ht 5\' 5"  (1.651 m)   Wt 94.3 kg   SpO2 98%   BMI 34.61 kg/m   General Appearance:    Alert, cooperative, no distress, appears stated age  Head:    Normocephalic, without obvious abnormality, atraumatic  Eyes:    PERRL, conjunctiva/corneas clear, EOM's  intact, fundi    benign, both eyes  Ears:    Normal TM's and external ear canals, both ears  Nose:   Nares normal, septum midline, mucosa normal, no drainage    or sinus tenderness  Throat:   Lips, mucosa, and tongue normal; teeth and gums normal  Neck:   Supple, symmetrical, trachea midline, no adenopathy;    thyroid:  no enlargement/tenderness/nodules; no carotid   bruit or JVD  Back:     Symmetric, no curvature, ROM normal, no CVA tenderness  Lungs:     Clear to auscultation bilaterally, respirations unlabored  Chest Wall:    No tenderness or deformity   Heart:    Regular rate and rhythm, S1 and S2 normal, no murmur, rub   or gallop  Breast Exam:    No tenderness, masses, or nipple abnormality  Abdomen:     Soft, non-tender, bowel sounds active all four quadrants,    no masses, no organomegaly  Genitalia:    Normal female without lesion, discharge or tenderness  Rectal:    Normal tone, no masses or tenderness;   guaiac negative stool  Extremities:   Extremities normal, atraumatic, no cyanosis or edema  Pulses:   2+ and symmetric all extremities  Skin:   Skin color, texture, turgor normal, no rashes or lesions  Lymph nodes:   Cervical, supraclavicular, and axillary nodes normal  Neurologic:   CNII-XII intact, normal strength, sensation and reflexes    throughout    Musculoskeletal:  ROM 0-120,  Ligaments intact,  Imaging Review Plain radiographs demonstrate severe degenerative joint disease of the left knee. The overall alignment is neutral. The bone quality appears to be good for age and reported activity level.  Assessment/Plan: Primary osteoarthritis, left knee   The patient history, physical examination and imaging studies are consistent with advanced degenerative joint disease of the left knee. The patient has failed conservative treatment.  The clearance notes were reviewed.  After discussion with the patient it was felt that Total Knee Replacement was indicated. The procedure,  risks, and benefits of total knee arthroplasty were presented and reviewed. The risks including but not limited to aseptic loosening, infection, blood clots, vascular injury, stiffness, patella tracking problems complications among others were discussed. The patient acknowledged the explanation, agreed to proceed with the plan.  Preoperative templating of the joint replacement has been completed, documented, and submitted to the Operating Room personnel in order to optimize intra-operative equipment management.    Patient's anticipated LOS is less than 2 midnights, meeting these requirements: - Lives within 1 hour of care - Has a competent adult at home to recover with post-op recover - NO history of  - Chronic pain requiring opiods  - Diabetes  - Coronary Artery Disease  - Heart failure  - Heart attack  - Stroke  - DVT/VTE  - Cardiac arrhythmia  - Respiratory Failure/COPD  - Renal failure  - Anemia  - Advanced Liver disease       Donia Ast 04/17/2019, 9:04 AM

## 2019-04-18 ENCOUNTER — Encounter (HOSPITAL_COMMUNITY): Payer: Self-pay | Admitting: Orthopedic Surgery

## 2019-04-18 DIAGNOSIS — I10 Essential (primary) hypertension: Secondary | ICD-10-CM | POA: Diagnosis not present

## 2019-04-18 DIAGNOSIS — M1712 Unilateral primary osteoarthritis, left knee: Secondary | ICD-10-CM | POA: Diagnosis not present

## 2019-04-18 DIAGNOSIS — E119 Type 2 diabetes mellitus without complications: Secondary | ICD-10-CM | POA: Diagnosis not present

## 2019-04-18 DIAGNOSIS — G4730 Sleep apnea, unspecified: Secondary | ICD-10-CM | POA: Diagnosis not present

## 2019-04-18 DIAGNOSIS — F329 Major depressive disorder, single episode, unspecified: Secondary | ICD-10-CM | POA: Diagnosis not present

## 2019-04-18 LAB — CBC
HCT: 36.1 % (ref 36.0–46.0)
Hemoglobin: 12.2 g/dL (ref 12.0–15.0)
MCH: 32 pg (ref 26.0–34.0)
MCHC: 33.8 g/dL (ref 30.0–36.0)
MCV: 94.8 fL (ref 80.0–100.0)
Platelets: 233 10*3/uL (ref 150–400)
RBC: 3.81 MIL/uL — ABNORMAL LOW (ref 3.87–5.11)
RDW: 12.3 % (ref 11.5–15.5)
WBC: 12.3 10*3/uL — ABNORMAL HIGH (ref 4.0–10.5)
nRBC: 0 % (ref 0.0–0.2)

## 2019-04-18 LAB — BASIC METABOLIC PANEL
Anion gap: 7 (ref 5–15)
BUN: 16 mg/dL (ref 8–23)
CO2: 24 mmol/L (ref 22–32)
Calcium: 8.2 mg/dL — ABNORMAL LOW (ref 8.9–10.3)
Chloride: 105 mmol/L (ref 98–111)
Creatinine, Ser: 0.67 mg/dL (ref 0.44–1.00)
GFR calc Af Amer: >60 mL/min (ref >60)
GFR calc non Af Amer: >60 mL/min (ref >60)
Glucose, Bld: 133 mg/dL — ABNORMAL HIGH (ref 70–99)
Potassium: 4.4 mmol/L (ref 3.5–5.1)
Sodium: 136 mmol/L (ref 135–145)

## 2019-04-18 MED ORDER — ASPIRIN 325 MG PO TBEC: 325.0000 mg | DELAYED_RELEASE_TABLET | Freq: Two times a day (BID) | ORAL | 0 refills | Status: DC

## 2019-04-18 MED ORDER — OXYCODONE HCL 5 MG PO TABS: 5.0000 mg | ORAL_TABLET | Freq: Four times a day (QID) | ORAL | 0 refills | Status: DC | PRN

## 2019-04-18 MED ORDER — METHOCARBAMOL 500 MG PO TABS: 500.0000 mg | ORAL_TABLET | Freq: Four times a day (QID) | ORAL | 0 refills | Status: DC | PRN

## 2019-04-18 NOTE — Care Management Obs Status (Signed)
[facility] NOTIFICATION   Patient Details  Name: SHATEKA PETREA MRN: [id number] Date of Birth: [birth year]   Medicare Observation Status Notification Given:  Yes    Joaquin Courts, RN 04/18/2019, 9:37 AM

## 2019-04-18 NOTE — Progress Notes (Signed)
SPORTS MEDICINE AND JOINT REPLACEMENT  Lara Mulch, MD    Carlyon Shadow, PA-C [place], [place], [place]  [address]                             [phone]   PROGRESS NOTE  Subjective:  negative for Chest Pain  negative for Shortness of Breath  negative for Nausea/Vomiting   negative for Calf Pain  negative for Bowel Movement   Tolerating Diet: yes         Patient reports pain as 3 on 0-10 scale.    Objective: Vital signs in last 24 hours:    Patient Vitals for the past 24 hrs:  BP Temp Temp src Pulse Resp SpO2 Height Weight  04/18/19 0405 (!) 108/52 98.1 F (36.7 C) Oral 66 18 100 % - -  04/18/19 0001 105/60 (!) 97.5 F (36.4 C) Oral 75 16 98 % - -  04/17/19 2035 120/75 97.9 F (36.6 C) Oral 73 16 - - -  04/17/19 1742 137/90 97.8 F (36.6 C) - 85 16 99 % - -  04/17/19 1630 (!) 132/59 98.3 F (36.8 C) Oral 81 16 100 % - -  04/17/19 1514 131/79 (!) 96.5 F (35.8 C) Axillary 64 14 100 % - -  04/17/19 1445 - - - 73 16 98 % - -  04/17/19 1430 138/85 - - (!) 55 15 100 % - -  04/17/19 1415 135/81 - - 60 16 100 % - -  04/17/19 1400 (!) 145/74 - - 63 19 100 % - -  04/17/19 1345 137/86 - - (!) 56 15 100 % - -  04/17/19 1330 127/85 - - (!) 55 12 100 % - -  04/17/19 1315 (!) 144/67 - - (!) 58 17 100 % - -  04/17/19 1300 115/71 - - (!) 56 14 100 % - -  04/17/19 1245 114/64 (!) 97.5 F (36.4 C) - 70 18 100 % - -  04/17/19 1030 130/79 - - (!) 56 (!) 8 100 % - -  04/17/19 1020 134/79 - - (!) 58 13 100 % - -  04/17/19 1005 128/79 - - (!) 52 (!) 8 100 % - -  04/17/19 1000 (!) 100/56 - - (!) 57 12 100 % - -  04/17/19 0955 - - - (!) 59 17 100 % - -  04/17/19 0900 - - - - - - 5\' 5"  (1.651 m) 94.3 kg  04/17/19 0805 116/76 98.6 F (37 C) Oral 67 18 98 % - -    @flow {1959:LAST@   Intake/Output from previous day:   08/17 0701 - 08/18 0700 In: 3749.2 [P.O.:360; I.V.:3239.2] Out: 2075 [Urine:2050]   Intake/Output this shift:   No intake/output data recorded.   Intake/Output      08/17 0701 - 08/18 0700 08/18 0701 - 08/19 0700   P.O. 360    I.V. (mL/kg) 3239.2 (34.3)    Other 0    IV Piggyback 150    Total Intake(mL/kg) 3749.2 (39.8)    Urine (mL/kg/hr) 2050    Blood 25    Total Output 2075    Net +1674.2         Urine Occurrence 1 x       LABORATORY DATA: Recent Labs    04/12/19 1028 04/18/19 0242  WBC 6.0 12.3*  HGB 13.3 12.2  HCT 39.3 36.1  PLT 224 233   Recent Labs  04/12/19 1028 04/18/19 0242  NA 136 136  K 4.3 4.4  CL 101 105  CO2 25 24  BUN 22 16  CREATININE 0.74 0.67  GLUCOSE 100* 133*  CALCIUM 8.7* 8.2*   No results found for: INR, PROTIME  Examination:  General appearance: alert, cooperative and no distress Extremities: extremities normal, atraumatic, no cyanosis or edema  Wound Exam: clean, dry, intact   Drainage:  None: wound tissue dry  Motor Exam: Quadriceps and Hamstrings Intact  Sensory Exam: Superficial Peroneal, Deep Peroneal and Tibial normal   Assessment:    1 Day Post-Op  Procedure(s) (LRB): TOTAL KNEE ARTHROPLASTY (Left)  ADDITIONAL DIAGNOSIS:  Active Problems:   S/P total knee replacement     Plan: Physical Therapy as ordered Weight Bearing as Tolerated (WBAT)  DVT Prophylaxis:  Aspirin  DISCHARGE PLAN: Home  Patient doing well, expect D/C home today      Patient's anticipated LOS is less than 2 midnights, meeting these requirements: - Lives within 1 hour of care - Has a competent adult at home to recover with post-op recover - NO history of  - Chronic pain requiring opiods  - Diabetes  - Coronary Artery Disease  - Heart failure  - Heart attack  - Stroke  - DVT/VTE  - Cardiac arrhythmia  - Respiratory Failure/COPD  - Renal failure  - Anemia  - Advanced Liver disease        Donia Ast 04/18/2019, 7:53 AM

## 2019-04-18 NOTE — Progress Notes (Signed)
RN reviewed discharge instructions with patient and family. All questions answered.   Paperwork and prescriptions given.   NT rolled patient down with all belongings to family car. 

## 2019-04-18 NOTE — TOC Initial Note (Signed)
Transition of Care [facility]) - Initial/Assessment Note    Patient Details  Name: Caitlin Stevenson MRN: [id number] Date of Birth: Aug 26, 1951  Transition of Care (TOC) CM/SW Contact:    Joaquin Courts, RN Phone Number: 04/18/2019, 9:57 AM  Clinical Narrative:      CM spoke with patient at bedside. Patient plans for OPPT, reports has rolling walker and 3-in-1.              Expected Discharge Plan: Home/Self Care Barriers to Discharge: No Barriers Identified   Patient Goals and CMS Choice Patient states their goals for this hospitalization and ongoing recovery are:: to go home      Expected Discharge Plan and Services Expected Discharge Plan: Home/Self Care   Discharge Planning Services: CM Consult   Living arrangements for the past 2 months: Single Family Home Expected Discharge Date: 04/18/19               DME Arranged: Continuous passive motion machine, Walker rolling DME Agency: [employer]       HH Arranged: NA HH Agency: NA        Prior Living Arrangements/Services Living arrangements for the past 2 months: Single Family Home Lives with:: Self Patient language and need for interpreter reviewed:: Yes Do you feel safe going back to the place where you live?: Yes      Need for Family Participation in Patient Care: Yes (Comment) Care giver support system in place?: Yes (comment)   Criminal Activity/Legal Involvement Pertinent to Current Situation/Hospitalization: No - Comment as needed  Activities of Daily Living Home Assistive Devices/Equipment: Eyeglasses, Blood pressure cuff ADL Screening (condition at time of admission) Patient's cognitive ability adequate to safely complete daily activities?: Yes Is the patient deaf or have difficulty hearing?: No Does the patient have difficulty seeing, even when wearing glasses/contacts?: No Does the patient have difficulty concentrating, remembering, or making decisions?: No Patient able to express need for assistance  with ADLs?: Yes Does the patient have difficulty dressing or bathing?: No Independently performs ADLs?: Yes (appropriate for developmental age) Does the patient have difficulty walking or climbing stairs?: Yes Weakness of Legs: Left Weakness of Arms/Hands: Both  Permission Sought/Granted                  Emotional Assessment Appearance:: Appears stated age Attitude/Demeanor/Rapport: Engaged Affect (typically observed): Accepting Orientation: : Oriented to Place, Oriented to  Time, Oriented to Situation, Oriented to Self   Psych Involvement: No (comment)  Admission diagnosis:  LT. KNEE [id number] Patient Active Problem List   Diagnosis Date Noted  . S/P total knee replacement 04/17/2019  . Hypothyroidism 04/17/2016  . Morbid obesity due to excess calories ([place]) 12/31/2015  . Ventral hernia 10/20/2015  . Essential hypertension 10/16/2015  . Cough variant asthma vs UACS  10/15/2015  . Diabetes mellitus type 2 in obese ([place]) 05/11/2007  . Hyperlipidemia 05/11/2007  . DEPRESSION 05/11/2007  . ASTHMA 05/11/2007  . Migraine 05/11/2007  . URINARY INCONTINENCE 05/11/2007   PCP:  Patient, No Pcp Per Pharmacy:   [facility] [place], [place] [address] [place] [address] Phone: [phone] Fax: [phone]     Social Determinants of Health (SDOH) Interventions    Readmission Risk Interventions No flowsheet data found.

## 2019-04-18 NOTE — Progress Notes (Signed)
Physical Therapy Treatment Patient Details Name: Caitlin Stevenson MRN: [id number] DOB: 10-21-1950 Today's Date: 04/18/2019    History of Present Illness s/p L TKA    PT Comments    POD # 1 am session. Assisted OOB. General bed mobility comments: increased time.  Instructed on using belt loop to self assist.  Assisted with mobility in hallway and to bathroom.  General transfer comment: cues for hand placement and safety with turns. General Gait Details: cues for sequence and RW position from self.  Practiced one step.General stair comments: VC's on proper walker placement and sequencing, performed twice.  Then returned to room to perform some TE's following HEP handout.  Instructed on proper tech, freq as well as use of ICE.   Addressed all mobility questions, discussed appropriate activity, educated on use of ICE.  Pt ready for D/C to home.      Follow Up Recommendations  Follow surgeon's recommendation for DC plan and follow-up therapies     Equipment Recommendations  None recommended by PT    Recommendations for Other Services       Precautions / Restrictions Precautions Precautions: Fall;Knee Restrictions Weight Bearing Restrictions: No LLE Weight Bearing: Weight bearing as tolerated    Mobility  Bed Mobility Overal bed mobility: Needs Assistance Bed Mobility: Supine to Sit     Supine to sit: Supervision     General bed mobility comments: increased time.  Instructed on using belt loop to self assist  Transfers Overall transfer level: Needs assistance Equipment used: Rolling walker (2 wheeled) Transfers: Sit to/from Stand Sit to Stand: Supervision;Min guard         General transfer comment: cues for hand placement and safety with turns  Ambulation/Gait Ambulation/Gait assistance: Supervision;Min guard Gait Distance (Feet): 47 Feet Assistive device: Rolling walker (2 wheeled) Gait Pattern/deviations: Step-to pattern;Decreased stance time - left Gait  velocity: decreased   General Gait Details: cues for sequence and RW position from self   Stairs Stairs: Yes Stairs assistance: Supervision;Min guard Stair Management: No rails;Step to pattern;With walker Number of Stairs: 1 General stair comments: VC's on proper walker placement and sequencing, performed twice   Wheelchair Mobility    Modified Rankin (Stroke Patients Only)       Balance                                            Cognition Arousal/Alertness: Awake/alert Behavior During Therapy: WFL for tasks assessed/performed Overall Cognitive Status: Within Functional Limits for tasks assessed                                        Exercises   Total Knee Replacement TE's 10 reps B LE ankle pumps 10 reps towel squeezes 10 reps knee presses 10 reps heel slides  10 reps SAQ's 10 reps SLR's 10 reps ABD Followed by ICE     General Comments        Pertinent Vitals/Pain Pain Assessment: 0-10 Pain Score: 4  Pain Location: L knee Pain Descriptors / Indicators: Aching;Grimacing Pain Intervention(s): Monitored during session;Ice applied;Repositioned;Premedicated before session    Home Living                      Prior Function  PT Goals (current goals can now be found in the care plan section) Progress towards PT goals: Progressing toward goals    Frequency    7X/week      PT Plan Current plan remains appropriate    Co-evaluation              AM-PAC PT "6 Clicks" Mobility   Outcome Measure  Help needed turning from your back to your side while in a flat bed without using bedrails?: A Little Help needed moving from lying on your back to sitting on the side of a flat bed without using bedrails?: A Little Help needed moving to and from a bed to a chair (including a wheelchair)?: A Little Help needed standing up from a chair using your arms (e.g., wheelchair or bedside chair)?: A  Little Help needed to walk in hospital room?: A Little Help needed climbing 3-5 steps with a railing? : A Little 6 Click Score: 18    End of Session Equipment Utilized During Treatment: Gait belt Activity Tolerance: Patient tolerated treatment well Patient left: in chair;with call bell/phone within reach;with chair alarm set Nurse Communication: Mobility status(pt ready for D/C after one session) PT Visit Diagnosis: Difficulty in walking, not elsewhere classified (R26.2)     Time: 1962-2297 PT Time Calculation (min) (ACUTE ONLY): 27 min  Charges:  $Gait Training: 8-22 mins $Therapeutic Exercise: 8-22 mins                     Rica Koyanagi  PTA Acute  Rehabilitation Services Pager      [phone] Office      [phone]

## 2019-04-24 DIAGNOSIS — R262 Difficulty in walking, not elsewhere classified: Secondary | ICD-10-CM | POA: Diagnosis not present

## 2019-04-24 DIAGNOSIS — M25662 Stiffness of left knee, not elsewhere classified: Secondary | ICD-10-CM | POA: Diagnosis not present

## 2019-04-24 DIAGNOSIS — M25562 Pain in left knee: Secondary | ICD-10-CM | POA: Diagnosis not present

## 2019-04-24 DIAGNOSIS — M1712 Unilateral primary osteoarthritis, left knee: Secondary | ICD-10-CM | POA: Diagnosis not present

## 2019-04-24 DIAGNOSIS — Z7409 Other reduced mobility: Secondary | ICD-10-CM | POA: Diagnosis not present

## 2019-04-24 DIAGNOSIS — Z789 Other specified health status: Secondary | ICD-10-CM | POA: Diagnosis not present

## 2019-04-24 NOTE — Discharge Summary (Signed)
SPORTS MEDICINE & JOINT REPLACEMENT   Lara Mulch, MD   Carlyon Shadow, PA-C [place], [place], [place]  [address]                             [phone]  PATIENT ID: Caitlin Stevenson        MRN:  [id number]          DOB/AGE: [birth year] / 68 y.o.    DISCHARGE SUMMARY  ADMISSION DATE:    04/17/2019 DISCHARGE DATE:   04/18/2019  ADMISSION DIAGNOSIS: LT. KNEE [id number]    DISCHARGE DIAGNOSIS:  LT. KNEE [id number]    ADDITIONAL DIAGNOSIS: Active Problems:   S/P total knee replacement  Past Medical History:  Diagnosis Date  . Allergy   . Arthritis   . Chronic back pain   . Depression   . Diabetes mellitus without complication (HCC)    diet controlled  . History of carpal tunnel syndrome    Bilateral  . Hypertension   . Hypothyroidism   . Migraine   . Obese   . Pneumonia    x2  . Psoriasis   . Sleep apnea    mouth guard    PROCEDURE: Procedure(s): TOTAL KNEE ARTHROPLASTY on 04/17/2019  CONSULTS:    HISTORY:  See H&P in chart  HOSPITAL COURSE:  Caitlin Stevenson is a 68 y.o. admitted on 04/17/2019 and found to have a diagnosis of LT. KNEE [id number].  After appropriate laboratory studies were obtained  they were taken to the operating room on 04/17/2019 and underwent Procedure(s): TOTAL KNEE ARTHROPLASTY.   They were given perioperative antibiotics:  Anti-infectives (From admission, onward)   Start     Dose/Rate Route Frequency Ordered Stop   04/17/19 1700  clindamycin (CLEOCIN) IVPB 600 mg     600 mg 100 mL/hr over 30 Minutes Intravenous Every 6 hours 04/17/19 1451 04/18/19 0020   04/17/19 0800  clindamycin (CLEOCIN) IVPB 900 mg     900 mg 100 mL/hr over 30 Minutes Intravenous On call to O.R. 04/17/19 [id number] 04/17/19 1059    .  Patient given tranexamic acid IV or topical and exparel intra-operatively.  Tolerated the procedure well.    POD# 1: Vital signs were stable.  Patient denied Chest pain, shortness of breath, or  calf pain.  Patient was started on Aspirin twice daily at 8am.  Consults to PT, OT, and care management were made.  The patient was weight bearing as tolerated.  CPM was placed on the operative leg 0-90 degrees for 6-8 hours a day. When out of the CPM, patient was placed in the foam block to achieve full extension. Incentive spirometry was taught.  Dressing was changed.       POD #2, Continued  PT for ambulation and exercise program.  IV saline locked.  O2 discontinued.    The remainder of the hospital course was dedicated to ambulation and strengthening.   The patient was discharged on 1 day post op in  Good condition.  Blood products given:none  DIAGNOSTIC STUDIES: Recent vital signs: No data found.     Recent laboratory studies: Recent Labs    04/18/19 0242  WBC 12.3*  HGB 12.2  HCT 36.1  PLT 233   Recent Labs    04/18/19 0242  NA 136  K 4.4  CL 105  CO2 24  BUN 16  CREATININE 0.67  GLUCOSE 133*  CALCIUM 8.2*   No results  found for: INR, PROTIME   Recent Radiographic Studies :  No results found.  DISCHARGE INSTRUCTIONS: Discharge Instructions    Call MD / Call 911   Complete by: As directed    If you experience chest pain or shortness of breath, CALL 911 and be transported to the hospital emergency room.  If you develope a fever above 101 F, pus (white drainage) or increased drainage or redness at the wound, or calf pain, call your surgeon's office.   Constipation Prevention   Complete by: As directed    Drink plenty of fluids.  Prune juice may be helpful.  You may use a stool softener, such as Colace (over the counter) 100 mg twice a day.  Use MiraLax (over the counter) for constipation as needed.   Diet - low sodium heart healthy   Complete by: As directed    Discharge instructions   Complete by: As directed    INSTRUCTIONS AFTER JOINT REPLACEMENT   Remove items at home which could result in a fall. This includes throw rugs or furniture in walking  pathways ICE to the affected joint every three hours while awake for 30 minutes at a time, for at least the first 3-5 days, and then as needed for pain and swelling.  Continue to use ice for pain and swelling. You may notice swelling that will progress down to the foot and ankle.  This is normal after surgery.  Elevate your leg when you are not up walking on it.   Continue to use the breathing machine you got in the hospital (incentive spirometer) which will help keep your temperature down.  It is common for your temperature to cycle up and down following surgery, especially at night when you are not up moving around and exerting yourself.  The breathing machine keeps your lungs expanded and your temperature down.   DIET:  As you were doing prior to hospitalization, we recommend a well-balanced diet.  DRESSING / WOUND CARE / SHOWERING  Keep the surgical dressing until follow up.  The dressing is water proof, so you can shower without any extra covering.  IF THE DRESSING FALLS OFF or the wound gets wet inside, change the dressing with sterile gauze.  Please use good hand washing techniques before changing the dressing.  Do not use any lotions or creams on the incision until instructed by your surgeon.    ACTIVITY  Increase activity slowly as tolerated, but follow the weight bearing instructions below.   No driving for 6 weeks or until further direction given by your physician.  You cannot drive while taking narcotics.  No lifting or carrying greater than 10 lbs. until further directed by your surgeon. Avoid periods of inactivity such as sitting longer than an hour when not asleep. This helps prevent blood clots.  You may return to work once you are authorized by your doctor.     WEIGHT BEARING   Weight bearing as tolerated with assist device (walker, cane, etc) as directed, use it as long as suggested by your surgeon or therapist, typically at least 4-6 weeks.   EXERCISES  Results after  joint replacement surgery are often greatly improved when you follow the exercise, range of motion and muscle strengthening exercises prescribed by your doctor. Safety measures are also important to protect the joint from further injury. Any time any of these exercises cause you to have increased pain or swelling, decrease what you are doing until you are comfortable again and then slowly  increase them. If you have problems or questions, call your caregiver or physical therapist for advice.   Rehabilitation is important following a joint replacement. After just a few days of immobilization, the muscles of the leg can become weakened and shrink (atrophy).  These exercises are designed to build up the tone and strength of the thigh and leg muscles and to improve motion. Often times heat used for twenty to thirty minutes before working out will loosen up your tissues and help with improving the range of motion but do not use heat for the first two weeks following surgery (sometimes heat can increase post-operative swelling).   These exercises can be done on a training (exercise) mat, on the floor, on a table or on a bed. Use whatever works the best and is most comfortable for you.    Use music or television while you are exercising so that the exercises are a pleasant break in your day. This will make your life better with the exercises acting as a break in your routine that you can look forward to.   Perform all exercises about fifteen times, three times per day or as directed.  You should exercise both the operative leg and the other leg as well.   Exercises include:   Quad Sets - Tighten up the muscle on the front of the thigh (Quad) and hold for 5-10 seconds.   Straight Leg Raises - With your knee straight (if you were given a brace, keep it on), lift the leg to 60 degrees, hold for 3 seconds, and slowly lower the leg.  Perform this exercise against resistance later as your leg gets stronger.  Leg Slides:  Lying on your back, slowly slide your foot toward your buttocks, bending your knee up off the floor (only go as far as is comfortable). Then slowly slide your foot back down until your leg is flat on the floor again.  Angel Wings: Lying on your back spread your legs to the side as far apart as you can without causing discomfort.  Hamstring Strength:  Lying on your back, push your heel against the floor with your leg straight by tightening up the muscles of your buttocks.  Repeat, but this time bend your knee to a comfortable angle, and push your heel against the floor.  You may put a pillow under the heel to make it more comfortable if necessary.   A rehabilitation program following joint replacement surgery can speed recovery and prevent re-injury in the future due to weakened muscles. Contact your doctor or a physical therapist for more information on knee rehabilitation.    CONSTIPATION  Constipation is defined medically as fewer than three stools per week and severe constipation as less than one stool per week.  Even if you have a regular bowel pattern at home, your normal regimen is likely to be disrupted due to multiple reasons following surgery.  Combination of anesthesia, postoperative narcotics, change in appetite and fluid intake all can affect your bowels.   YOU MUST use at least one of the following options; they are listed in order of increasing strength to get the job done.  They are all available over the counter, and you may need to use some, POSSIBLY even all of these options:    Drink plenty of fluids (prune juice may be helpful) and high fiber foods Colace 100 mg by mouth twice a day  Senokot for constipation as directed and as needed Dulcolax (bisacodyl), take with full  glass of water  Miralax (polyethylene glycol) once or twice a day as needed.  If you have tried all these things and are unable to have a bowel movement in the first 3-4 days after surgery call either your  surgeon or your primary doctor.    If you experience loose stools or diarrhea, hold the medications until you stool forms back up.  If your symptoms do not get better within 1 week or if they get worse, check with your doctor.  If you experience "the worst abdominal pain ever" or develop nausea or vomiting, please contact the office immediately for further recommendations for treatment.   ITCHING:  If you experience itching with your medications, try taking only a single pain pill, or even half a pain pill at a time.  You can also use Benadryl over the counter for itching or also to help with sleep.   TED HOSE STOCKINGS:  Use stockings on both legs until for at least 2 weeks or as directed by physician office. They may be removed at night for sleeping.  MEDICATIONS:  See your medication summary on the "After Visit Summary" that nursing will review with you.  You may have some home medications which will be placed on hold until you complete the course of blood thinner medication.  It is important for you to complete the blood thinner medication as prescribed.  PRECAUTIONS:  If you experience chest pain or shortness of breath - call 911 immediately for transfer to the hospital emergency department.   If you develop a fever greater that 101 F, purulent drainage from wound, increased redness or drainage from wound, foul odor from the wound/dressing, or calf pain - CONTACT YOUR SURGEON.                                                   FOLLOW-UP APPOINTMENTS:  If you do not already have a post-op appointment, please call the office for an appointment to be seen by your surgeon.  Guidelines for how soon to be seen are listed in your "After Visit Summary", but are typically between 1-4 weeks after surgery.  OTHER INSTRUCTIONS:   Knee Replacement:  Do not place pillow under knee, focus on keeping the knee straight while resting. CPM instructions: 0-90 degrees, 2 hours in the morning, 2 hours in the  afternoon, and 2 hours in the evening. Place foam block, curve side up under heel at all times except when in CPM or when walking.  DO NOT modify, tear, cut, or change the foam block in any way.  MAKE SURE YOU:  Understand these instructions.  Get help right away if you are not doing well or get worse.    Thank you for letting us be a part of your medical care team.  It is a privilege we respect greatly.  We hope these instructions will help you stay on track for a fast and full recovery!   Increase activity slowly as tolerated   Complete by: As directed       DISCHARGE MEDICATIONS:   Allergies as of 04/18/2019      Reactions   Atenolol    Esophageal Spasms   Minocycline Hcl    Migraines and Dizziness   Morphine Sulfate Nausea And Vomiting   Amoxicillin Rash   Did it involve swelling of the  face/tongue/throat, SOB, or low BP? No Did it involve sudden or severe rash/hives, skin peeling, or any reaction on the inside of your mouth or nose? No Did you need to seek medical attention at a hospital or doctor's office? No When did it last happen?Childhood allergy If all above answers are "NO", may proceed with cephalosporin use.   Penicillins Rash   Did it involve swelling of the face/tongue/throat, SOB, or low BP? No Did it involve sudden or severe rash/hives, skin peeling, or any reaction on the inside of your mouth or nose? No Did you need to seek medical attention at a hospital or doctor's office? No When did it last happen?Childhood allergy If all above answers are "NO", may proceed with cephalosporin use.      Medication List    TAKE these medications   aspirin 325 MG EC tablet Take 1 tablet (325 mg total) by mouth 2 (two) times daily. Notes to patient: Blood clot prevention after joint replacement   estradiol 0.1 MG/24HR patch Commonly known as: VIVELLE-DOT Place 1 patch onto the skin 2 (two) times a week. Notes to patient: Home regimen   Flutter Devi Use as  directed   gabapentin 100 MG capsule Commonly known as: Neurontin Take 1 capsule (100 mg total) by mouth 3 (three) times daily. One three times daily   halobetasol 0.05 % ointment Commonly known as: ULTRAVATE Apply topically 2 (two) times daily. What changed:   how much to take  when to take this  reasons to take this   hydrochlorothiazide 12.5 MG capsule Commonly known as: MICROZIDE Take 1 capsule (12.5 mg total) by mouth daily. Need annual appointment with labs for further refills   levothyroxine 137 MCG tablet Commonly known as: SYNTHROID TAKE ONE TABLET BY MOUTH DAILY BEFORE BREAKFAST What changed: See the new instructions.   levothyroxine 137 MCG tablet Commonly known as: SYNTHROID Take 1 tablet (137 mcg total) by mouth daily before breakfast. Need annual appointment with labs for further refills What changed: Another medication with the same name was changed. Make sure you understand how and when to take each.   LORazepam 1 MG tablet Commonly known as: ATIVAN Take 1 mg by mouth 3 (three) times daily as needed for anxiety.   methocarbamol 500 MG tablet Commonly known as: ROBAXIN Take 1-2 tablets (500-1,000 mg total) by mouth every 6 (six) hours as needed for muscle spasms.   montelukast 10 MG tablet Commonly known as: SINGULAIR TK 1 T PO QPM   neomycin-polymyxin-hydrocortisone 3.5-10000-1 OTIC suspension Commonly known as: CORTISPORIN Place 3 drops into both ears 3 (three) times daily.   olmesartan-hydrochlorothiazide 20-12.5 MG tablet Commonly known as: BENICAR HCT Take 1 tablet by mouth at bedtime.   Omega-3 1000 MG Caps Take 2,000-3,000 mg by mouth daily.   oxyCODONE 5 MG immediate release tablet Commonly known as: Oxy IR/ROXICODONE Take 1-2 tablets (5-10 mg total) by mouth every 6 (six) hours as needed for moderate pain (pain score 4-6).   pantoprazole 40 MG tablet Commonly known as: Protonix Take 1 tablet (40 mg total) by mouth daily. Take 30-60  min before first meal of the day   progesterone 100 MG capsule Commonly known as: PROMETRIUM Take 100 mg by mouth daily.   SUMAtriptan 100 MG tablet Commonly known as: IMITREX Take 1 tablet (100 mg total) by mouth every 2 (two) hours as needed for migraine. May repeat in 2 hours if headache persists or recurs.   zinc gluconate 50 MG tablet Take  50 mg by mouth daily.   zolpidem 10 MG tablet Commonly known as: AMBIEN Take 10 mg by mouth at bedtime as needed for sleep.       FOLLOW UP VISIT:    DISPOSITION: HOME VS. SNF  CONDITION:  Good   Donia Ast 04/24/2019, 9:03 AM

## 2019-04-26 DIAGNOSIS — Z471 Aftercare following joint replacement surgery: Secondary | ICD-10-CM | POA: Diagnosis not present

## 2019-04-26 DIAGNOSIS — M1712 Unilateral primary osteoarthritis, left knee: Secondary | ICD-10-CM | POA: Diagnosis not present

## 2019-04-26 DIAGNOSIS — R262 Difficulty in walking, not elsewhere classified: Secondary | ICD-10-CM | POA: Diagnosis not present

## 2019-04-26 DIAGNOSIS — M25662 Stiffness of left knee, not elsewhere classified: Secondary | ICD-10-CM | POA: Diagnosis not present

## 2019-04-26 DIAGNOSIS — Z789 Other specified health status: Secondary | ICD-10-CM | POA: Diagnosis not present

## 2019-04-26 DIAGNOSIS — M25562 Pain in left knee: Secondary | ICD-10-CM | POA: Diagnosis not present

## 2019-04-26 DIAGNOSIS — Z96652 Presence of left artificial knee joint: Secondary | ICD-10-CM | POA: Diagnosis not present

## 2019-04-26 DIAGNOSIS — Z7409 Other reduced mobility: Secondary | ICD-10-CM | POA: Diagnosis not present

## 2019-04-27 DIAGNOSIS — Z96652 Presence of left artificial knee joint: Secondary | ICD-10-CM | POA: Diagnosis not present

## 2019-05-01 DIAGNOSIS — M25662 Stiffness of left knee, not elsewhere classified: Secondary | ICD-10-CM | POA: Diagnosis not present

## 2019-05-01 DIAGNOSIS — Z96652 Presence of left artificial knee joint: Secondary | ICD-10-CM | POA: Diagnosis not present

## 2019-05-01 DIAGNOSIS — Z789 Other specified health status: Secondary | ICD-10-CM | POA: Diagnosis not present

## 2019-05-01 DIAGNOSIS — M1712 Unilateral primary osteoarthritis, left knee: Secondary | ICD-10-CM | POA: Diagnosis not present

## 2019-05-01 DIAGNOSIS — Z471 Aftercare following joint replacement surgery: Secondary | ICD-10-CM | POA: Diagnosis not present

## 2019-05-01 DIAGNOSIS — M25562 Pain in left knee: Secondary | ICD-10-CM | POA: Diagnosis not present

## 2019-05-01 DIAGNOSIS — Z7409 Other reduced mobility: Secondary | ICD-10-CM | POA: Diagnosis not present

## 2019-05-01 DIAGNOSIS — R262 Difficulty in walking, not elsewhere classified: Secondary | ICD-10-CM | POA: Diagnosis not present

## 2019-05-03 DIAGNOSIS — Z7409 Other reduced mobility: Secondary | ICD-10-CM | POA: Diagnosis not present

## 2019-05-03 DIAGNOSIS — M25562 Pain in left knee: Secondary | ICD-10-CM | POA: Diagnosis not present

## 2019-05-03 DIAGNOSIS — Z96652 Presence of left artificial knee joint: Secondary | ICD-10-CM | POA: Diagnosis not present

## 2019-05-03 DIAGNOSIS — Z471 Aftercare following joint replacement surgery: Secondary | ICD-10-CM | POA: Diagnosis not present

## 2019-05-03 DIAGNOSIS — M25662 Stiffness of left knee, not elsewhere classified: Secondary | ICD-10-CM | POA: Diagnosis not present

## 2019-05-03 DIAGNOSIS — R262 Difficulty in walking, not elsewhere classified: Secondary | ICD-10-CM | POA: Diagnosis not present

## 2019-05-03 DIAGNOSIS — Z789 Other specified health status: Secondary | ICD-10-CM | POA: Diagnosis not present

## 2019-05-03 DIAGNOSIS — M1712 Unilateral primary osteoarthritis, left knee: Secondary | ICD-10-CM | POA: Diagnosis not present

## 2019-05-10 DIAGNOSIS — R262 Difficulty in walking, not elsewhere classified: Secondary | ICD-10-CM | POA: Diagnosis not present

## 2019-05-10 DIAGNOSIS — Z96652 Presence of left artificial knee joint: Secondary | ICD-10-CM | POA: Diagnosis not present

## 2019-05-10 DIAGNOSIS — M1712 Unilateral primary osteoarthritis, left knee: Secondary | ICD-10-CM | POA: Diagnosis not present

## 2019-05-10 DIAGNOSIS — M25662 Stiffness of left knee, not elsewhere classified: Secondary | ICD-10-CM | POA: Diagnosis not present

## 2019-05-10 DIAGNOSIS — Z7409 Other reduced mobility: Secondary | ICD-10-CM | POA: Diagnosis not present

## 2019-05-10 DIAGNOSIS — Z789 Other specified health status: Secondary | ICD-10-CM | POA: Diagnosis not present

## 2019-05-10 DIAGNOSIS — M25562 Pain in left knee: Secondary | ICD-10-CM | POA: Diagnosis not present

## 2019-05-10 DIAGNOSIS — Z471 Aftercare following joint replacement surgery: Secondary | ICD-10-CM | POA: Diagnosis not present

## 2019-05-12 DIAGNOSIS — Z471 Aftercare following joint replacement surgery: Secondary | ICD-10-CM | POA: Diagnosis not present

## 2019-05-12 DIAGNOSIS — M25662 Stiffness of left knee, not elsewhere classified: Secondary | ICD-10-CM | POA: Diagnosis not present

## 2019-05-12 DIAGNOSIS — Z789 Other specified health status: Secondary | ICD-10-CM | POA: Diagnosis not present

## 2019-05-12 DIAGNOSIS — Z96652 Presence of left artificial knee joint: Secondary | ICD-10-CM | POA: Diagnosis not present

## 2019-05-12 DIAGNOSIS — M25562 Pain in left knee: Secondary | ICD-10-CM | POA: Diagnosis not present

## 2019-05-12 DIAGNOSIS — R262 Difficulty in walking, not elsewhere classified: Secondary | ICD-10-CM | POA: Diagnosis not present

## 2019-05-12 DIAGNOSIS — M1712 Unilateral primary osteoarthritis, left knee: Secondary | ICD-10-CM | POA: Diagnosis not present

## 2019-05-12 DIAGNOSIS — Z7409 Other reduced mobility: Secondary | ICD-10-CM | POA: Diagnosis not present

## 2019-05-15 DIAGNOSIS — N959 Unspecified menopausal and perimenopausal disorder: Secondary | ICD-10-CM | POA: Diagnosis not present

## 2019-05-15 DIAGNOSIS — M25562 Pain in left knee: Secondary | ICD-10-CM | POA: Diagnosis not present

## 2019-05-15 DIAGNOSIS — Z01419 Encounter for gynecological examination (general) (routine) without abnormal findings: Secondary | ICD-10-CM | POA: Diagnosis not present

## 2019-05-15 DIAGNOSIS — Z96652 Presence of left artificial knee joint: Secondary | ICD-10-CM | POA: Diagnosis not present

## 2019-05-15 DIAGNOSIS — M25662 Stiffness of left knee, not elsewhere classified: Secondary | ICD-10-CM | POA: Diagnosis not present

## 2019-05-15 DIAGNOSIS — Z789 Other specified health status: Secondary | ICD-10-CM | POA: Diagnosis not present

## 2019-05-15 DIAGNOSIS — Z471 Aftercare following joint replacement surgery: Secondary | ICD-10-CM | POA: Diagnosis not present

## 2019-05-15 DIAGNOSIS — Z7409 Other reduced mobility: Secondary | ICD-10-CM | POA: Diagnosis not present

## 2019-05-15 DIAGNOSIS — R262 Difficulty in walking, not elsewhere classified: Secondary | ICD-10-CM | POA: Diagnosis not present

## 2019-05-15 DIAGNOSIS — Z1231 Encounter for screening mammogram for malignant neoplasm of breast: Secondary | ICD-10-CM | POA: Diagnosis not present

## 2019-05-15 DIAGNOSIS — Z6833 Body mass index (BMI) 33.0-33.9, adult: Secondary | ICD-10-CM | POA: Diagnosis not present

## 2019-05-15 DIAGNOSIS — M1712 Unilateral primary osteoarthritis, left knee: Secondary | ICD-10-CM | POA: Diagnosis not present

## 2019-05-17 DIAGNOSIS — Z471 Aftercare following joint replacement surgery: Secondary | ICD-10-CM | POA: Diagnosis not present

## 2019-05-17 DIAGNOSIS — Z96652 Presence of left artificial knee joint: Secondary | ICD-10-CM | POA: Diagnosis not present

## 2019-05-17 DIAGNOSIS — M25662 Stiffness of left knee, not elsewhere classified: Secondary | ICD-10-CM | POA: Diagnosis not present

## 2019-05-17 DIAGNOSIS — Z7409 Other reduced mobility: Secondary | ICD-10-CM | POA: Diagnosis not present

## 2019-05-17 DIAGNOSIS — R262 Difficulty in walking, not elsewhere classified: Secondary | ICD-10-CM | POA: Diagnosis not present

## 2019-05-17 DIAGNOSIS — M1712 Unilateral primary osteoarthritis, left knee: Secondary | ICD-10-CM | POA: Diagnosis not present

## 2019-05-17 DIAGNOSIS — M25562 Pain in left knee: Secondary | ICD-10-CM | POA: Diagnosis not present

## 2019-05-17 DIAGNOSIS — Z789 Other specified health status: Secondary | ICD-10-CM | POA: Diagnosis not present

## 2019-05-23 DIAGNOSIS — Z789 Other specified health status: Secondary | ICD-10-CM | POA: Diagnosis not present

## 2019-05-23 DIAGNOSIS — R262 Difficulty in walking, not elsewhere classified: Secondary | ICD-10-CM | POA: Diagnosis not present

## 2019-05-23 DIAGNOSIS — Z96652 Presence of left artificial knee joint: Secondary | ICD-10-CM | POA: Diagnosis not present

## 2019-05-23 DIAGNOSIS — Z7409 Other reduced mobility: Secondary | ICD-10-CM | POA: Diagnosis not present

## 2019-05-23 DIAGNOSIS — M25562 Pain in left knee: Secondary | ICD-10-CM | POA: Diagnosis not present

## 2019-05-23 DIAGNOSIS — M25462 Effusion, left knee: Secondary | ICD-10-CM | POA: Diagnosis not present

## 2019-05-23 DIAGNOSIS — M25662 Stiffness of left knee, not elsewhere classified: Secondary | ICD-10-CM | POA: Diagnosis not present

## 2019-05-26 DIAGNOSIS — M25562 Pain in left knee: Secondary | ICD-10-CM | POA: Diagnosis not present

## 2019-05-26 DIAGNOSIS — R29898 Other symptoms and signs involving the musculoskeletal system: Secondary | ICD-10-CM | POA: Diagnosis not present

## 2019-05-26 DIAGNOSIS — Z96652 Presence of left artificial knee joint: Secondary | ICD-10-CM | POA: Diagnosis not present

## 2019-05-26 DIAGNOSIS — Z789 Other specified health status: Secondary | ICD-10-CM | POA: Diagnosis not present

## 2019-05-26 DIAGNOSIS — M25662 Stiffness of left knee, not elsewhere classified: Secondary | ICD-10-CM | POA: Diagnosis not present

## 2019-05-26 DIAGNOSIS — M25462 Effusion, left knee: Secondary | ICD-10-CM | POA: Diagnosis not present

## 2019-05-26 DIAGNOSIS — Z7409 Other reduced mobility: Secondary | ICD-10-CM | POA: Diagnosis not present

## 2019-05-26 DIAGNOSIS — R262 Difficulty in walking, not elsewhere classified: Secondary | ICD-10-CM | POA: Diagnosis not present

## 2019-05-29 DIAGNOSIS — R262 Difficulty in walking, not elsewhere classified: Secondary | ICD-10-CM | POA: Diagnosis not present

## 2019-05-29 DIAGNOSIS — Z471 Aftercare following joint replacement surgery: Secondary | ICD-10-CM | POA: Diagnosis not present

## 2019-05-29 DIAGNOSIS — M25662 Stiffness of left knee, not elsewhere classified: Secondary | ICD-10-CM | POA: Diagnosis not present

## 2019-05-29 DIAGNOSIS — R29898 Other symptoms and signs involving the musculoskeletal system: Secondary | ICD-10-CM | POA: Diagnosis not present

## 2019-05-29 DIAGNOSIS — Z7409 Other reduced mobility: Secondary | ICD-10-CM | POA: Diagnosis not present

## 2019-05-29 DIAGNOSIS — M25462 Effusion, left knee: Secondary | ICD-10-CM | POA: Diagnosis not present

## 2019-05-29 DIAGNOSIS — Z96652 Presence of left artificial knee joint: Secondary | ICD-10-CM | POA: Diagnosis not present

## 2019-05-29 DIAGNOSIS — Z789 Other specified health status: Secondary | ICD-10-CM | POA: Diagnosis not present

## 2019-05-29 DIAGNOSIS — M1712 Unilateral primary osteoarthritis, left knee: Secondary | ICD-10-CM | POA: Diagnosis not present

## 2019-05-29 DIAGNOSIS — M25562 Pain in left knee: Secondary | ICD-10-CM | POA: Diagnosis not present

## 2019-06-01 DIAGNOSIS — Z96652 Presence of left artificial knee joint: Secondary | ICD-10-CM | POA: Diagnosis not present

## 2019-06-01 DIAGNOSIS — Z789 Other specified health status: Secondary | ICD-10-CM | POA: Diagnosis not present

## 2019-06-01 DIAGNOSIS — R29898 Other symptoms and signs involving the musculoskeletal system: Secondary | ICD-10-CM | POA: Diagnosis not present

## 2019-06-01 DIAGNOSIS — Z7409 Other reduced mobility: Secondary | ICD-10-CM | POA: Diagnosis not present

## 2019-06-01 DIAGNOSIS — M25562 Pain in left knee: Secondary | ICD-10-CM | POA: Diagnosis not present

## 2019-06-01 DIAGNOSIS — M25462 Effusion, left knee: Secondary | ICD-10-CM | POA: Diagnosis not present

## 2019-06-01 DIAGNOSIS — M25662 Stiffness of left knee, not elsewhere classified: Secondary | ICD-10-CM | POA: Diagnosis not present

## 2019-06-01 DIAGNOSIS — R262 Difficulty in walking, not elsewhere classified: Secondary | ICD-10-CM | POA: Diagnosis not present

## 2019-06-07 DIAGNOSIS — Z1331 Encounter for screening for depression: Secondary | ICD-10-CM | POA: Diagnosis not present

## 2019-06-07 DIAGNOSIS — E039 Hypothyroidism, unspecified: Secondary | ICD-10-CM | POA: Diagnosis not present

## 2019-06-07 DIAGNOSIS — K635 Polyp of colon: Secondary | ICD-10-CM | POA: Diagnosis not present

## 2019-06-07 DIAGNOSIS — I10 Essential (primary) hypertension: Secondary | ICD-10-CM | POA: Diagnosis not present

## 2019-06-07 DIAGNOSIS — E785 Hyperlipidemia, unspecified: Secondary | ICD-10-CM | POA: Diagnosis not present

## 2019-06-07 DIAGNOSIS — E559 Vitamin D deficiency, unspecified: Secondary | ICD-10-CM | POA: Diagnosis not present

## 2019-06-07 DIAGNOSIS — F329 Major depressive disorder, single episode, unspecified: Secondary | ICD-10-CM | POA: Diagnosis not present

## 2019-06-07 DIAGNOSIS — R7302 Impaired glucose tolerance (oral): Secondary | ICD-10-CM | POA: Diagnosis not present

## 2019-06-07 DIAGNOSIS — G43909 Migraine, unspecified, not intractable, without status migrainosus: Secondary | ICD-10-CM | POA: Diagnosis not present

## 2019-06-07 DIAGNOSIS — J45991 Cough variant asthma: Secondary | ICD-10-CM | POA: Diagnosis not present

## 2019-06-08 DIAGNOSIS — R29898 Other symptoms and signs involving the musculoskeletal system: Secondary | ICD-10-CM | POA: Diagnosis not present

## 2019-06-08 DIAGNOSIS — Z789 Other specified health status: Secondary | ICD-10-CM | POA: Diagnosis not present

## 2019-06-08 DIAGNOSIS — Z7409 Other reduced mobility: Secondary | ICD-10-CM | POA: Diagnosis not present

## 2019-06-08 DIAGNOSIS — R262 Difficulty in walking, not elsewhere classified: Secondary | ICD-10-CM | POA: Diagnosis not present

## 2019-06-08 DIAGNOSIS — M25462 Effusion, left knee: Secondary | ICD-10-CM | POA: Diagnosis not present

## 2019-06-08 DIAGNOSIS — M25562 Pain in left knee: Secondary | ICD-10-CM | POA: Diagnosis not present

## 2019-06-08 DIAGNOSIS — Z96652 Presence of left artificial knee joint: Secondary | ICD-10-CM | POA: Diagnosis not present

## 2019-06-08 DIAGNOSIS — M25662 Stiffness of left knee, not elsewhere classified: Secondary | ICD-10-CM | POA: Diagnosis not present

## 2019-06-13 DIAGNOSIS — R29898 Other symptoms and signs involving the musculoskeletal system: Secondary | ICD-10-CM | POA: Diagnosis not present

## 2019-06-13 DIAGNOSIS — Z789 Other specified health status: Secondary | ICD-10-CM | POA: Diagnosis not present

## 2019-06-13 DIAGNOSIS — M25662 Stiffness of left knee, not elsewhere classified: Secondary | ICD-10-CM | POA: Diagnosis not present

## 2019-06-13 DIAGNOSIS — Z96652 Presence of left artificial knee joint: Secondary | ICD-10-CM | POA: Diagnosis not present

## 2019-06-13 DIAGNOSIS — Z7409 Other reduced mobility: Secondary | ICD-10-CM | POA: Diagnosis not present

## 2019-06-13 DIAGNOSIS — R262 Difficulty in walking, not elsewhere classified: Secondary | ICD-10-CM | POA: Diagnosis not present

## 2019-06-13 DIAGNOSIS — M25462 Effusion, left knee: Secondary | ICD-10-CM | POA: Diagnosis not present

## 2019-06-13 DIAGNOSIS — M25562 Pain in left knee: Secondary | ICD-10-CM | POA: Diagnosis not present

## 2019-06-16 DIAGNOSIS — R29898 Other symptoms and signs involving the musculoskeletal system: Secondary | ICD-10-CM | POA: Diagnosis not present

## 2019-06-16 DIAGNOSIS — M25562 Pain in left knee: Secondary | ICD-10-CM | POA: Diagnosis not present

## 2019-06-16 DIAGNOSIS — Z7409 Other reduced mobility: Secondary | ICD-10-CM | POA: Diagnosis not present

## 2019-06-16 DIAGNOSIS — M25462 Effusion, left knee: Secondary | ICD-10-CM | POA: Diagnosis not present

## 2019-06-16 DIAGNOSIS — Z96652 Presence of left artificial knee joint: Secondary | ICD-10-CM | POA: Diagnosis not present

## 2019-06-16 DIAGNOSIS — R262 Difficulty in walking, not elsewhere classified: Secondary | ICD-10-CM | POA: Diagnosis not present

## 2019-06-16 DIAGNOSIS — M25662 Stiffness of left knee, not elsewhere classified: Secondary | ICD-10-CM | POA: Diagnosis not present

## 2019-06-16 DIAGNOSIS — Z789 Other specified health status: Secondary | ICD-10-CM | POA: Diagnosis not present

## 2019-06-20 DIAGNOSIS — M25562 Pain in left knee: Secondary | ICD-10-CM | POA: Diagnosis not present

## 2019-06-20 DIAGNOSIS — M25662 Stiffness of left knee, not elsewhere classified: Secondary | ICD-10-CM | POA: Diagnosis not present

## 2019-06-20 DIAGNOSIS — R262 Difficulty in walking, not elsewhere classified: Secondary | ICD-10-CM | POA: Diagnosis not present

## 2019-06-20 DIAGNOSIS — Z96652 Presence of left artificial knee joint: Secondary | ICD-10-CM | POA: Diagnosis not present

## 2019-06-20 DIAGNOSIS — R29898 Other symptoms and signs involving the musculoskeletal system: Secondary | ICD-10-CM | POA: Diagnosis not present

## 2019-06-20 DIAGNOSIS — Z789 Other specified health status: Secondary | ICD-10-CM | POA: Diagnosis not present

## 2019-06-20 DIAGNOSIS — M25462 Effusion, left knee: Secondary | ICD-10-CM | POA: Diagnosis not present

## 2019-06-20 DIAGNOSIS — Z7409 Other reduced mobility: Secondary | ICD-10-CM | POA: Diagnosis not present

## 2019-06-22 DIAGNOSIS — M25462 Effusion, left knee: Secondary | ICD-10-CM | POA: Diagnosis not present

## 2019-06-22 DIAGNOSIS — M25562 Pain in left knee: Secondary | ICD-10-CM | POA: Diagnosis not present

## 2019-06-22 DIAGNOSIS — M25662 Stiffness of left knee, not elsewhere classified: Secondary | ICD-10-CM | POA: Diagnosis not present

## 2019-06-22 DIAGNOSIS — R29898 Other symptoms and signs involving the musculoskeletal system: Secondary | ICD-10-CM | POA: Diagnosis not present

## 2019-06-22 DIAGNOSIS — R262 Difficulty in walking, not elsewhere classified: Secondary | ICD-10-CM | POA: Diagnosis not present

## 2019-06-22 DIAGNOSIS — Z7409 Other reduced mobility: Secondary | ICD-10-CM | POA: Diagnosis not present

## 2019-06-22 DIAGNOSIS — Z96652 Presence of left artificial knee joint: Secondary | ICD-10-CM | POA: Diagnosis not present

## 2019-06-22 DIAGNOSIS — Z789 Other specified health status: Secondary | ICD-10-CM | POA: Diagnosis not present

## 2019-06-27 DIAGNOSIS — R29898 Other symptoms and signs involving the musculoskeletal system: Secondary | ICD-10-CM | POA: Diagnosis not present

## 2019-06-27 DIAGNOSIS — M25662 Stiffness of left knee, not elsewhere classified: Secondary | ICD-10-CM | POA: Diagnosis not present

## 2019-06-27 DIAGNOSIS — M25562 Pain in left knee: Secondary | ICD-10-CM | POA: Diagnosis not present

## 2019-06-27 DIAGNOSIS — M25462 Effusion, left knee: Secondary | ICD-10-CM | POA: Diagnosis not present

## 2019-06-27 DIAGNOSIS — Z7409 Other reduced mobility: Secondary | ICD-10-CM | POA: Diagnosis not present

## 2019-06-27 DIAGNOSIS — Z789 Other specified health status: Secondary | ICD-10-CM | POA: Diagnosis not present

## 2019-06-27 DIAGNOSIS — Z96652 Presence of left artificial knee joint: Secondary | ICD-10-CM | POA: Diagnosis not present

## 2019-06-27 DIAGNOSIS — R262 Difficulty in walking, not elsewhere classified: Secondary | ICD-10-CM | POA: Diagnosis not present

## 2019-06-29 DIAGNOSIS — M25562 Pain in left knee: Secondary | ICD-10-CM | POA: Diagnosis not present

## 2019-06-29 DIAGNOSIS — M25662 Stiffness of left knee, not elsewhere classified: Secondary | ICD-10-CM | POA: Diagnosis not present

## 2019-06-29 DIAGNOSIS — R29898 Other symptoms and signs involving the musculoskeletal system: Secondary | ICD-10-CM | POA: Diagnosis not present

## 2019-06-29 DIAGNOSIS — Z96652 Presence of left artificial knee joint: Secondary | ICD-10-CM | POA: Diagnosis not present

## 2019-06-29 DIAGNOSIS — Z7409 Other reduced mobility: Secondary | ICD-10-CM | POA: Diagnosis not present

## 2019-06-29 DIAGNOSIS — M25462 Effusion, left knee: Secondary | ICD-10-CM | POA: Diagnosis not present

## 2019-06-29 DIAGNOSIS — Z789 Other specified health status: Secondary | ICD-10-CM | POA: Diagnosis not present

## 2019-06-29 DIAGNOSIS — R262 Difficulty in walking, not elsewhere classified: Secondary | ICD-10-CM | POA: Diagnosis not present

## 2019-07-04 DIAGNOSIS — M25662 Stiffness of left knee, not elsewhere classified: Secondary | ICD-10-CM | POA: Diagnosis not present

## 2019-07-04 DIAGNOSIS — M25562 Pain in left knee: Secondary | ICD-10-CM | POA: Diagnosis not present

## 2019-07-04 DIAGNOSIS — R262 Difficulty in walking, not elsewhere classified: Secondary | ICD-10-CM | POA: Diagnosis not present

## 2019-07-04 DIAGNOSIS — Z7409 Other reduced mobility: Secondary | ICD-10-CM | POA: Diagnosis not present

## 2019-07-04 DIAGNOSIS — R29898 Other symptoms and signs involving the musculoskeletal system: Secondary | ICD-10-CM | POA: Diagnosis not present

## 2019-07-04 DIAGNOSIS — Z789 Other specified health status: Secondary | ICD-10-CM | POA: Diagnosis not present

## 2019-07-04 DIAGNOSIS — Z96652 Presence of left artificial knee joint: Secondary | ICD-10-CM | POA: Diagnosis not present

## 2019-07-04 DIAGNOSIS — M25462 Effusion, left knee: Secondary | ICD-10-CM | POA: Diagnosis not present

## 2019-07-06 DIAGNOSIS — Z96652 Presence of left artificial knee joint: Secondary | ICD-10-CM | POA: Diagnosis not present

## 2019-07-06 DIAGNOSIS — Z789 Other specified health status: Secondary | ICD-10-CM | POA: Diagnosis not present

## 2019-07-06 DIAGNOSIS — M25462 Effusion, left knee: Secondary | ICD-10-CM | POA: Diagnosis not present

## 2019-07-06 DIAGNOSIS — Z7409 Other reduced mobility: Secondary | ICD-10-CM | POA: Diagnosis not present

## 2019-07-06 DIAGNOSIS — M25662 Stiffness of left knee, not elsewhere classified: Secondary | ICD-10-CM | POA: Diagnosis not present

## 2019-07-06 DIAGNOSIS — R262 Difficulty in walking, not elsewhere classified: Secondary | ICD-10-CM | POA: Diagnosis not present

## 2019-07-06 DIAGNOSIS — R29898 Other symptoms and signs involving the musculoskeletal system: Secondary | ICD-10-CM | POA: Diagnosis not present

## 2019-07-06 DIAGNOSIS — M25562 Pain in left knee: Secondary | ICD-10-CM | POA: Diagnosis not present

## 2019-07-13 DIAGNOSIS — M25562 Pain in left knee: Secondary | ICD-10-CM | POA: Diagnosis not present

## 2019-07-13 DIAGNOSIS — Z789 Other specified health status: Secondary | ICD-10-CM | POA: Diagnosis not present

## 2019-07-13 DIAGNOSIS — R262 Difficulty in walking, not elsewhere classified: Secondary | ICD-10-CM | POA: Diagnosis not present

## 2019-07-13 DIAGNOSIS — Z6831 Body mass index (BMI) 31.0-31.9, adult: Secondary | ICD-10-CM | POA: Diagnosis not present

## 2019-07-13 DIAGNOSIS — R29898 Other symptoms and signs involving the musculoskeletal system: Secondary | ICD-10-CM | POA: Diagnosis not present

## 2019-07-13 DIAGNOSIS — M25462 Effusion, left knee: Secondary | ICD-10-CM | POA: Diagnosis not present

## 2019-07-13 DIAGNOSIS — Z7409 Other reduced mobility: Secondary | ICD-10-CM | POA: Diagnosis not present

## 2019-07-13 DIAGNOSIS — M25662 Stiffness of left knee, not elsewhere classified: Secondary | ICD-10-CM | POA: Diagnosis not present

## 2019-07-13 DIAGNOSIS — M5136 Other intervertebral disc degeneration, lumbar region: Secondary | ICD-10-CM | POA: Diagnosis not present

## 2019-07-13 DIAGNOSIS — I10 Essential (primary) hypertension: Secondary | ICD-10-CM | POA: Diagnosis not present

## 2019-07-18 DIAGNOSIS — Z789 Other specified health status: Secondary | ICD-10-CM | POA: Diagnosis not present

## 2019-07-18 DIAGNOSIS — R29898 Other symptoms and signs involving the musculoskeletal system: Secondary | ICD-10-CM | POA: Diagnosis not present

## 2019-07-18 DIAGNOSIS — M25562 Pain in left knee: Secondary | ICD-10-CM | POA: Diagnosis not present

## 2019-07-18 DIAGNOSIS — Z23 Encounter for immunization: Secondary | ICD-10-CM | POA: Diagnosis not present

## 2019-07-18 DIAGNOSIS — M25462 Effusion, left knee: Secondary | ICD-10-CM | POA: Diagnosis not present

## 2019-07-18 DIAGNOSIS — Z7409 Other reduced mobility: Secondary | ICD-10-CM | POA: Diagnosis not present

## 2019-07-18 DIAGNOSIS — M25662 Stiffness of left knee, not elsewhere classified: Secondary | ICD-10-CM | POA: Diagnosis not present

## 2019-07-18 DIAGNOSIS — R262 Difficulty in walking, not elsewhere classified: Secondary | ICD-10-CM | POA: Diagnosis not present

## 2019-07-18 DIAGNOSIS — Z96652 Presence of left artificial knee joint: Secondary | ICD-10-CM | POA: Diagnosis not present

## 2019-07-19 DIAGNOSIS — M5136 Other intervertebral disc degeneration, lumbar region: Secondary | ICD-10-CM | POA: Diagnosis not present

## 2019-07-20 DIAGNOSIS — M25562 Pain in left knee: Secondary | ICD-10-CM | POA: Diagnosis not present

## 2019-07-20 DIAGNOSIS — Z7409 Other reduced mobility: Secondary | ICD-10-CM | POA: Diagnosis not present

## 2019-07-20 DIAGNOSIS — R29898 Other symptoms and signs involving the musculoskeletal system: Secondary | ICD-10-CM | POA: Diagnosis not present

## 2019-07-20 DIAGNOSIS — M25462 Effusion, left knee: Secondary | ICD-10-CM | POA: Diagnosis not present

## 2019-07-20 DIAGNOSIS — R262 Difficulty in walking, not elsewhere classified: Secondary | ICD-10-CM | POA: Diagnosis not present

## 2019-07-20 DIAGNOSIS — Z789 Other specified health status: Secondary | ICD-10-CM | POA: Diagnosis not present

## 2019-07-20 DIAGNOSIS — Z96652 Presence of left artificial knee joint: Secondary | ICD-10-CM | POA: Diagnosis not present

## 2019-07-20 DIAGNOSIS — M25662 Stiffness of left knee, not elsewhere classified: Secondary | ICD-10-CM | POA: Diagnosis not present

## 2019-07-25 DIAGNOSIS — Z7409 Other reduced mobility: Secondary | ICD-10-CM | POA: Diagnosis not present

## 2019-07-25 DIAGNOSIS — M25662 Stiffness of left knee, not elsewhere classified: Secondary | ICD-10-CM | POA: Diagnosis not present

## 2019-07-25 DIAGNOSIS — M25462 Effusion, left knee: Secondary | ICD-10-CM | POA: Diagnosis not present

## 2019-07-25 DIAGNOSIS — Z96652 Presence of left artificial knee joint: Secondary | ICD-10-CM | POA: Diagnosis not present

## 2019-07-25 DIAGNOSIS — Z789 Other specified health status: Secondary | ICD-10-CM | POA: Diagnosis not present

## 2019-07-25 DIAGNOSIS — R262 Difficulty in walking, not elsewhere classified: Secondary | ICD-10-CM | POA: Diagnosis not present

## 2019-07-25 DIAGNOSIS — M25562 Pain in left knee: Secondary | ICD-10-CM | POA: Diagnosis not present

## 2019-07-25 DIAGNOSIS — R29898 Other symptoms and signs involving the musculoskeletal system: Secondary | ICD-10-CM | POA: Diagnosis not present

## 2019-08-03 DIAGNOSIS — R29898 Other symptoms and signs involving the musculoskeletal system: Secondary | ICD-10-CM | POA: Diagnosis not present

## 2019-08-03 DIAGNOSIS — R262 Difficulty in walking, not elsewhere classified: Secondary | ICD-10-CM | POA: Diagnosis not present

## 2019-08-03 DIAGNOSIS — Z789 Other specified health status: Secondary | ICD-10-CM | POA: Diagnosis not present

## 2019-08-03 DIAGNOSIS — M25562 Pain in left knee: Secondary | ICD-10-CM | POA: Diagnosis not present

## 2019-08-03 DIAGNOSIS — M25662 Stiffness of left knee, not elsewhere classified: Secondary | ICD-10-CM | POA: Diagnosis not present

## 2019-08-03 DIAGNOSIS — Z96652 Presence of left artificial knee joint: Secondary | ICD-10-CM | POA: Diagnosis not present

## 2019-08-03 DIAGNOSIS — M25462 Effusion, left knee: Secondary | ICD-10-CM | POA: Diagnosis not present

## 2019-08-03 DIAGNOSIS — Z7409 Other reduced mobility: Secondary | ICD-10-CM | POA: Diagnosis not present

## 2019-09-27 DIAGNOSIS — Z20828 Contact with and (suspected) exposure to other viral communicable diseases: Secondary | ICD-10-CM | POA: Diagnosis not present

## 2019-10-05 DIAGNOSIS — Z20828 Contact with and (suspected) exposure to other viral communicable diseases: Secondary | ICD-10-CM | POA: Diagnosis not present

## 2019-10-10 DIAGNOSIS — Z20828 Contact with and (suspected) exposure to other viral communicable diseases: Secondary | ICD-10-CM | POA: Diagnosis not present

## 2019-10-12 DIAGNOSIS — Z20828 Contact with and (suspected) exposure to other viral communicable diseases: Secondary | ICD-10-CM | POA: Diagnosis not present

## 2019-10-16 DIAGNOSIS — Z20828 Contact with and (suspected) exposure to other viral communicable diseases: Secondary | ICD-10-CM | POA: Diagnosis not present

## 2019-10-25 DIAGNOSIS — Z20828 Contact with and (suspected) exposure to other viral communicable diseases: Secondary | ICD-10-CM | POA: Diagnosis not present

## 2020-02-12 DIAGNOSIS — D23121 Other benign neoplasm of skin of left upper eyelid, including canthus: Secondary | ICD-10-CM | POA: Diagnosis not present

## 2020-04-25 DIAGNOSIS — M5136 Other intervertebral disc degeneration, lumbar region: Secondary | ICD-10-CM | POA: Diagnosis not present

## 2020-04-25 DIAGNOSIS — M546 Pain in thoracic spine: Secondary | ICD-10-CM | POA: Diagnosis not present

## 2020-04-25 DIAGNOSIS — M7918 Myalgia, other site: Secondary | ICD-10-CM | POA: Diagnosis not present

## 2020-05-15 DIAGNOSIS — M5124 Other intervertebral disc displacement, thoracic region: Secondary | ICD-10-CM | POA: Diagnosis not present

## 2020-05-20 DIAGNOSIS — M546 Pain in thoracic spine: Secondary | ICD-10-CM | POA: Diagnosis not present

## 2020-05-20 DIAGNOSIS — M791 Myalgia, unspecified site: Secondary | ICD-10-CM | POA: Diagnosis not present

## 2020-05-20 DIAGNOSIS — M5136 Other intervertebral disc degeneration, lumbar region: Secondary | ICD-10-CM | POA: Diagnosis not present

## 2020-05-25 DIAGNOSIS — Z20822 Contact with and (suspected) exposure to covid-19: Secondary | ICD-10-CM | POA: Diagnosis not present

## 2020-06-07 DIAGNOSIS — E669 Obesity, unspecified: Secondary | ICD-10-CM | POA: Diagnosis not present

## 2020-06-07 DIAGNOSIS — G43909 Migraine, unspecified, not intractable, without status migrainosus: Secondary | ICD-10-CM | POA: Diagnosis not present

## 2020-06-07 DIAGNOSIS — E039 Hypothyroidism, unspecified: Secondary | ICD-10-CM | POA: Diagnosis not present

## 2020-06-07 DIAGNOSIS — E559 Vitamin D deficiency, unspecified: Secondary | ICD-10-CM | POA: Diagnosis not present

## 2020-06-07 DIAGNOSIS — I10 Essential (primary) hypertension: Secondary | ICD-10-CM | POA: Diagnosis not present

## 2020-06-07 DIAGNOSIS — E119 Type 2 diabetes mellitus without complications: Secondary | ICD-10-CM | POA: Diagnosis not present

## 2020-06-07 DIAGNOSIS — K635 Polyp of colon: Secondary | ICD-10-CM | POA: Diagnosis not present

## 2020-06-07 DIAGNOSIS — E785 Hyperlipidemia, unspecified: Secondary | ICD-10-CM | POA: Diagnosis not present

## 2020-06-07 DIAGNOSIS — J45991 Cough variant asthma: Secondary | ICD-10-CM | POA: Diagnosis not present

## 2020-06-07 DIAGNOSIS — F329 Major depressive disorder, single episode, unspecified: Secondary | ICD-10-CM | POA: Diagnosis not present

## 2020-06-12 DIAGNOSIS — Z1272 Encounter for screening for malignant neoplasm of vagina: Secondary | ICD-10-CM | POA: Diagnosis not present

## 2020-06-12 DIAGNOSIS — Z90710 Acquired absence of both cervix and uterus: Secondary | ICD-10-CM | POA: Diagnosis not present

## 2020-06-12 DIAGNOSIS — Z124 Encounter for screening for malignant neoplasm of cervix: Secondary | ICD-10-CM | POA: Diagnosis not present

## 2020-06-12 DIAGNOSIS — N390 Urinary tract infection, site not specified: Secondary | ICD-10-CM | POA: Diagnosis not present

## 2020-06-12 DIAGNOSIS — Z1231 Encounter for screening mammogram for malignant neoplasm of breast: Secondary | ICD-10-CM | POA: Diagnosis not present

## 2020-06-12 DIAGNOSIS — N959 Unspecified menopausal and perimenopausal disorder: Secondary | ICD-10-CM | POA: Diagnosis not present

## 2020-06-12 DIAGNOSIS — Z6834 Body mass index (BMI) 34.0-34.9, adult: Secondary | ICD-10-CM | POA: Diagnosis not present

## 2020-06-29 DIAGNOSIS — R059 Cough, unspecified: Secondary | ICD-10-CM | POA: Diagnosis not present

## 2020-06-29 DIAGNOSIS — Z888 Allergy status to other drugs, medicaments and biological substances status: Secondary | ICD-10-CM | POA: Diagnosis not present

## 2020-06-29 DIAGNOSIS — U071 COVID-19: Secondary | ICD-10-CM | POA: Diagnosis not present

## 2020-06-29 DIAGNOSIS — Z881 Allergy status to other antibiotic agents status: Secondary | ICD-10-CM | POA: Diagnosis not present

## 2020-06-29 DIAGNOSIS — Z23 Encounter for immunization: Secondary | ICD-10-CM | POA: Diagnosis not present

## 2020-06-29 DIAGNOSIS — E079 Disorder of thyroid, unspecified: Secondary | ICD-10-CM | POA: Diagnosis not present

## 2020-06-29 DIAGNOSIS — Z8616 Personal history of COVID-19: Secondary | ICD-10-CM | POA: Diagnosis not present

## 2020-06-29 DIAGNOSIS — Z885 Allergy status to narcotic agent status: Secondary | ICD-10-CM | POA: Diagnosis not present

## 2020-06-29 DIAGNOSIS — F329 Major depressive disorder, single episode, unspecified: Secondary | ICD-10-CM | POA: Diagnosis not present

## 2020-06-29 DIAGNOSIS — E039 Hypothyroidism, unspecified: Secondary | ICD-10-CM | POA: Diagnosis not present

## 2020-06-29 DIAGNOSIS — F419 Anxiety disorder, unspecified: Secondary | ICD-10-CM | POA: Diagnosis not present

## 2020-06-29 DIAGNOSIS — G4700 Insomnia, unspecified: Secondary | ICD-10-CM | POA: Diagnosis not present

## 2020-06-29 DIAGNOSIS — Z87891 Personal history of nicotine dependence: Secondary | ICD-10-CM | POA: Diagnosis not present

## 2020-06-29 DIAGNOSIS — Z79890 Hormone replacement therapy (postmenopausal): Secondary | ICD-10-CM | POA: Diagnosis not present

## 2020-06-29 DIAGNOSIS — Z880 Allergy status to penicillin: Secondary | ICD-10-CM | POA: Diagnosis not present

## 2020-08-02 DIAGNOSIS — M5136 Other intervertebral disc degeneration, lumbar region: Secondary | ICD-10-CM | POA: Diagnosis not present

## 2020-08-02 DIAGNOSIS — M5416 Radiculopathy, lumbar region: Secondary | ICD-10-CM | POA: Diagnosis not present

## 2020-08-02 DIAGNOSIS — M546 Pain in thoracic spine: Secondary | ICD-10-CM | POA: Diagnosis not present

## 2020-08-21 DIAGNOSIS — M5416 Radiculopathy, lumbar region: Secondary | ICD-10-CM | POA: Diagnosis not present

## 2020-10-10 DIAGNOSIS — N958 Other specified menopausal and perimenopausal disorders: Secondary | ICD-10-CM | POA: Diagnosis not present

## 2020-11-19 DIAGNOSIS — I10 Essential (primary) hypertension: Secondary | ICD-10-CM | POA: Diagnosis not present

## 2021-01-14 ENCOUNTER — Encounter (HOSPITAL_BASED_OUTPATIENT_CLINIC_OR_DEPARTMENT_OTHER): Payer: Self-pay | Admitting: Physical Therapy

## 2021-01-14 ENCOUNTER — Ambulatory Visit (HOSPITAL_BASED_OUTPATIENT_CLINIC_OR_DEPARTMENT_OTHER): Payer: Medicare Other | Attending: Orthopedic Surgery | Admitting: Physical Therapy

## 2021-01-14 ENCOUNTER — Other Ambulatory Visit: Payer: Self-pay

## 2021-01-14 DIAGNOSIS — M546 Pain in thoracic spine: Secondary | ICD-10-CM | POA: Insufficient documentation

## 2021-01-14 DIAGNOSIS — R293 Abnormal posture: Secondary | ICD-10-CM | POA: Diagnosis not present

## 2021-01-14 NOTE — Therapy (Signed)
[facility] [place], [place], [address] Phone: [phone]   Fax:  [phone]  Physical Therapy Evaluation  Patient Details  Name: Caitlin Stevenson MRN: [id number] Date of Birth: 1950/10/15 Referring Provider (PT): Karenann Cai, [place]   Encounter Date: 01/14/2021   PT End of Session - 01/14/21 1537    Visit Number 1    Number of Visits 13    Date for PT Re-Evaluation 02/28/21    Authorization Type MCR    Progress Note Due on Visit 10    PT Start Time 9983    PT Stop Time 1600    PT Time Calculation (min) 30 min    Activity Tolerance Patient tolerated treatment well    Behavior During Therapy [facility] for tasks assessed/performed           Past Medical History:  Diagnosis Date  . Allergy   . Arthritis   . Chronic back pain   . Depression   . Diabetes mellitus without complication (HCC)    diet controlled  . History of carpal tunnel syndrome    Bilateral  . Hypertension   . Hypothyroidism   . Migraine   . Obese   . Pneumonia    x2  . Psoriasis   . Sleep apnea    mouth guard    Past Surgical History:  Procedure Laterality Date  . ABDOMINAL HYSTERECTOMY    . CARPAL TUNNEL RELEASE    . CERVICAL FUSION    . COLONOSCOPY    . FRACTURE SURGERY     tibia fracture  . TOTAL KNEE ARTHROPLASTY Left 04/17/2019   Procedure: TOTAL KNEE ARTHROPLASTY;  Surgeon: Vickey Huger, MD;  Location: [facility];  Service: Orthopedics;  Laterality: Left;  . UPPER GI ENDOSCOPY      There were no vitals filed for this visit.    Subjective Assessment - 01/14/21 1535    Subjective Had epidurals for lumbar pain but now have compressed disk in thoracic region. Have a 1yo grandson that is hard to lfit. I have migraines and am on second week of shingles. Upper back pain about 2 yr ago of insidious onset. If lifting 20lb+ it feels very fatigued. no regular stretching or exercise.    Patient Stated Goals pick up grandson     Currently in Pain? Yes    Pain Score 2     Pain Location Back    Pain Orientation Upper;Mid    Pain Descriptors / Indicators --   fatigued   Aggravating Factors  lifting, bending    Pain Relieving Factors aleve              [facility] PT Assessment - 01/14/21 0001      Assessment   Medical Diagnosis pain in thoracic spine    Referring Provider (PT) Mahar, Jaclyn Shaggy, PA-C    Hand Dominance Left      Precautions   Precautions None      Restrictions   Weight Bearing Restrictions No      Balance Screen   Has the patient fallen in the past 6 months No      Prior Function   Vocation Requirements opening hypnosis clinic    Leisure grand son      Cognition   Overall Cognitive Status Within Functional Limits for tasks assessed      Sensation   Additional Comments [facility]      Posture/Postural Control   Posture Comments slight incr in kyphosis, apparent rotational  scoliosis (shoudlers rotated Rt, hips Lt)      Palpation   Palpation comment tightness in bil upper traps and thoracic paraspinals                      Objective measurements completed on examination: See above findings.       [facility] Adult PT Treatment/Exercise - 01/14/21 0001      Exercises   Exercises Shoulder      Shoulder Exercises: Seated   Retraction Limitations scapular retraction for resting posture      Shoulder Exercises: Standing   Other Standing Exercises hip hinge with dowel      Shoulder Exercises: Stretch   Corner Stretch Limitations door pec stretch    Table Stretch - ABduction Limitations open book                  PT Education - 01/14/21 1926    Education Details anatomy of condition, POC, HEP, exercise form/rationale    Person(s) Educated Patient    Methods Explanation;Demonstration;Tactile cues;Verbal cues;Handout    Comprehension Verbalized understanding;Need further instruction;Returned demonstration;Verbal cues required;Tactile cues required                PT Long Term Goals - 01/14/21 1932      PT LONG TERM GOAL #1   Title able to lift grandson without incr back pain    Baseline pain at eval    Time 6    Period Weeks    Status New    Target Date 02/28/21      PT LONG TERM GOAL #2   Title independent in long term stretching/exercise program    Baseline to be progressed and est    Time 6    Period Weeks    Status New    Target Date 02/28/21      PT LONG TERM GOAL #3   Title able to demo proper bend/lift form for objects on floor    Baseline will educate    Time 6    Period Weeks    Status New    Target Date 02/28/21      PT LONG TERM GOAL #4   Title able to demo proper postural alignment wihtout cuing    Baseline will educate    Time 6    Period Weeks    Status New    Target Date 02/28/21                  Plan - 01/14/21 1928    Clinical Impression Statement pt presents with chronic thoracic pain of insiious onset. Apparent rotational scoliosis where shoulders rotate to the Rt and pelvis to Lt. Incr kyphosis and tendency to utilize kyphosis for forward bending rather than hip hinge. Reports a compressed disk in thoracic region and discussed importance of retraining aggrivating motions. Pt will benefit from PT to improve anterior chain flexibility, post chain strength and postural alignment in functional motions.    Personal Factors and Comorbidities Time since onset of injury/illness/exacerbation    Examination-Activity Limitations Squat;Lift;Bend;Stand;Carry    Examination-Participation Restrictions Occupation    Stability/Clinical Decision Making Stable/Uncomplicated    Clinical Decision Making Low    Rehab Potential Good    PT Frequency 2x / week    PT Duration 6 weeks    PT Treatment/Interventions Aquatic Therapy;Cryotherapy;Moist Heat;Iontophoresis 4mg /ml Dexamethasone;ADLs/Self Care Home Management;Electrical Stimulation;Functional mobility training;Neuromuscular re-education;Therapeutic exercise;Therapeutic  activities;Patient/family education;Manual techniques;Dry needling;Passive range of motion;Taping;Spinal Manipulations    PT  Next Visit Plan manual to decr spasm, cont hip hinge, periscapular strengthening    PT Home Exercise Plan [email or web address]    Consulted and Agree with Plan of Care Patient           Patient will benefit from skilled therapeutic intervention in order to improve the following deficits and impairments:  Increased muscle spasms,Improper body mechanics,Decreased activity tolerance,Decreased strength,Impaired UE functional use,Postural dysfunction,Pain  Visit Diagnosis: Pain in thoracic spine - Plan: PT plan of care cert/re-cert  Abnormal posture - Plan: PT plan of care cert/re-cert     Problem List Patient Active Problem List   Diagnosis Date Noted  . S/P total knee replacement 04/17/2019  . Hypothyroidism 04/17/2016  . Morbid obesity due to excess calories ([place]) 12/31/2015  . Ventral hernia 10/20/2015  . Essential hypertension 10/16/2015  . Cough variant asthma vs UACS  10/15/2015  . Diabetes mellitus type 2 in obese ([place]) 05/11/2007  . Hyperlipidemia 05/11/2007  . DEPRESSION 05/11/2007  . ASTHMA 05/11/2007  . Migraine 05/11/2007  . URINARY INCONTINENCE 05/11/2007    Lucillia Corson C. Shayle Donahoo PT, DPT 01/14/21 7:36 PM   [facility] [place], [place], [address] Phone: [phone]   Fax:  [phone]  Name: Caitlin Stevenson MRN: [id number] Date of Birth: [birth year]

## 2021-01-24 ENCOUNTER — Ambulatory Visit (HOSPITAL_BASED_OUTPATIENT_CLINIC_OR_DEPARTMENT_OTHER): Payer: Medicare Other | Admitting: Physical Therapy

## 2021-01-30 ENCOUNTER — Encounter (HOSPITAL_BASED_OUTPATIENT_CLINIC_OR_DEPARTMENT_OTHER): Payer: Self-pay | Admitting: Physical Therapy

## 2021-01-30 ENCOUNTER — Other Ambulatory Visit: Payer: Self-pay

## 2021-01-30 ENCOUNTER — Ambulatory Visit (HOSPITAL_BASED_OUTPATIENT_CLINIC_OR_DEPARTMENT_OTHER): Payer: Medicare Other | Attending: Orthopedic Surgery | Admitting: Physical Therapy

## 2021-01-30 DIAGNOSIS — M546 Pain in thoracic spine: Secondary | ICD-10-CM | POA: Diagnosis not present

## 2021-01-30 DIAGNOSIS — R293 Abnormal posture: Secondary | ICD-10-CM | POA: Diagnosis not present

## 2021-01-30 NOTE — Therapy (Signed)
[place], [place], [address] Phone: [phone]   Fax:  [phone]  Physical Therapy Treatment  Patient Details  Name: Caitlin Stevenson MRN: [id number] Date of Birth: Oct 17, 1950 Referring Provider (PT): Karenann Cai, [place]   Encounter Date: 01/30/2021   PT End of Session - 01/30/21 1703    Visit Number 2    Number of Visits 13    Date for PT Re-Evaluation 02/28/21    Authorization Type MCR    Progress Note Due on Visit 10    PT Start Time 2683    PT Stop Time 4196    PT Time Calculation (min) 45 min    Activity Tolerance Patient tolerated treatment well;No increased pain    Behavior During Therapy WFL for tasks assessed/performed           Past Medical History:  Diagnosis Date  . Allergy   . Arthritis   . Chronic back pain   . Depression   . Diabetes mellitus without complication (HCC)    diet controlled  . History of carpal tunnel syndrome    Bilateral  . Hypertension   . Hypothyroidism   . Migraine   . Obese   . Pneumonia    x2  . Psoriasis   . Sleep apnea    mouth guard    Past Surgical History:  Procedure Laterality Date  . ABDOMINAL HYSTERECTOMY    . CARPAL TUNNEL RELEASE    . CERVICAL FUSION    . COLONOSCOPY    . FRACTURE SURGERY     tibia fracture  . TOTAL KNEE ARTHROPLASTY Left 04/17/2019   Procedure: TOTAL KNEE ARTHROPLASTY;  Surgeon: Vickey Huger, MD;  Location: [facility];  Service: Orthopedics;  Laterality: Left;  . UPPER GI ENDOSCOPY      There were no vitals filed for this visit.   Subjective Assessment - 01/30/21 1614    Subjective Pt states that she had to lift 5 gallon cans of paint. Her low back is a little sore right now.    Patient Stated Goals pick up grandson    Currently in Pain? Yes    Pain Score 6     Pain Location Back    Pain Orientation Right;Lower    Pain Descriptors / Indicators Aching;Sore    Pain Type Acute pain    Pain Radiating Towards  none                             [facility] Adult PT Treatment/Exercise - 01/30/21 0001      Shoulder Exercises: Sidelying   Other Sidelying Exercises open book stretch x15 reps each side      Shoulder Exercises: Standing   Extension 10 reps    Theraband Level (Shoulder Extension) Level 2 (Red)    Row 10 reps;Both;Strengthening    Theraband Level (Shoulder Row) Level 2 (Red)    Row Limitations x10 reps with green TB    Other Standing Exercises hip hinge with dowel 2x8 reps      Manual Therapy   Manual Therapy Soft tissue mobilization;Joint mobilization    Joint Mobilization T6-T8 grade IV mobilization 2x30 sec    Soft tissue mobilization STM Rt thoracic/lumbar paraspinals                  PT Education - 01/30/21 1703    Education Details dry needling info    Person(s) Educated Patient  Methods Explanation;Handout    Comprehension Verbalized understanding               PT Long Term Goals - 01/14/21 1932      PT LONG TERM GOAL #1   Title able to lift grandson without incr back pain    Baseline pain at eval    Time 6    Period Weeks    Status New    Target Date 02/28/21      PT LONG TERM GOAL #2   Title independent in long term stretching/exercise program    Baseline to be progressed and est    Time 6    Period Weeks    Status New    Target Date 02/28/21      PT LONG TERM GOAL #3   Title able to demo proper bend/lift form for objects on floor    Baseline will educate    Time 6    Period Weeks    Status New    Target Date 02/28/21      PT LONG TERM GOAL #4   Title able to demo proper postural alignment wihtout cuing    Baseline will educate    Time 6    Period Weeks    Status New    Target Date 02/28/21                 Plan - 01/30/21 1704    Clinical Impression Statement Pt had increase in Rt sided low back pain after lifting heavy buckets of pain prior to today's session. PT reviewed her technique with a hip hinge,  and pt required intermittent cuing to increase glute recruitment, but her technique was greatly improved end of session. Pt was able to complete standing scap retraction exercises without increase in mid back pain. Session ended with soft tissue and joint mobilization to the thoracic and lumbar spine, primarily on the Rt to decrease muscle spasm and pain. PT also discussed benefits of dry needling and this is something the pt is interested in trying next session. Ended without increase in back pain.    Personal Factors and Comorbidities Time since onset of injury/illness/exacerbation    Examination-Activity Limitations Squat;Lift;Bend;Stand;Carry    Examination-Participation Restrictions Occupation    Stability/Clinical Decision Making Stable/Uncomplicated    Rehab Potential Good    PT Frequency 2x / week    PT Duration 6 weeks    PT Treatment/Interventions Aquatic Therapy;Cryotherapy;Moist Heat;Iontophoresis 4mg /ml Dexamethasone;ADLs/Self Care Home Management;Electrical Stimulation;Functional mobility training;Neuromuscular re-education;Therapeutic exercise;Therapeutic activities;Patient/family education;Manual techniques;Dry needling;Passive range of motion;Taping;Spinal Manipulations    PT Next Visit Plan d/n mid thoracic spine; manual to decr spasm, cont hip hinge/leg press for LE strength in lifting, periscapular strengthening    PT Home Exercise Plan [id number]    Consulted and Agree with Plan of Care Patient           Patient will benefit from skilled therapeutic intervention in order to improve the following deficits and impairments:  Increased muscle spasms,Improper body mechanics,Decreased activity tolerance,Decreased strength,Impaired UE functional use,Postural dysfunction,Pain  Visit Diagnosis: Pain in thoracic spine  Abnormal posture     Problem List Patient Active Problem List   Diagnosis Date Noted  . S/P total knee replacement 04/17/2019  . Hypothyroidism 04/17/2016  .  Morbid obesity due to excess calories ([place]) 12/31/2015  . Ventral hernia 10/20/2015  . Essential hypertension 10/16/2015  . Cough variant asthma vs UACS  10/15/2015  . Diabetes mellitus type 2 in obese ([place]) 05/11/2007  .  Hyperlipidemia 05/11/2007  . DEPRESSION 05/11/2007  . ASTHMA 05/11/2007  . Migraine 05/11/2007  . URINARY INCONTINENCE 05/11/2007    5:13 PM,01/30/21 Sherol Dade PT, DPT [facility]  [facility] [place], [place], [address] Phone: [phone]   Fax:  [phone]  Name: Caitlin Stevenson MRN: [id number] Date of Birth: 1951-06-05

## 2021-01-30 NOTE — Patient Instructions (Signed)

## 2021-02-03 ENCOUNTER — Ambulatory Visit (HOSPITAL_BASED_OUTPATIENT_CLINIC_OR_DEPARTMENT_OTHER): Payer: Medicare Other | Admitting: Physical Therapy

## 2021-02-07 ENCOUNTER — Other Ambulatory Visit: Payer: Self-pay

## 2021-02-07 ENCOUNTER — Ambulatory Visit (HOSPITAL_BASED_OUTPATIENT_CLINIC_OR_DEPARTMENT_OTHER): Payer: Medicare Other | Admitting: Physical Therapy

## 2021-02-07 ENCOUNTER — Encounter (HOSPITAL_BASED_OUTPATIENT_CLINIC_OR_DEPARTMENT_OTHER): Payer: Self-pay | Admitting: Physical Therapy

## 2021-02-07 DIAGNOSIS — M546 Pain in thoracic spine: Secondary | ICD-10-CM

## 2021-02-07 DIAGNOSIS — R293 Abnormal posture: Secondary | ICD-10-CM

## 2021-02-07 NOTE — Therapy (Signed)
[place], [place], [address] Phone: [phone]   Fax:  [phone]  Physical Therapy Treatment  Patient Details  Name: Caitlin Stevenson MRN: [id number] Date of Birth: 1951/08/19 Referring Provider (PT): Karenann Cai, [place]   Encounter Date: 02/07/2021   PT End of Session - 02/07/21 1559     Visit Number 3    Number of Visits 13    Date for PT Re-Evaluation 02/28/21    Authorization Type MCR    Progress Note Due on Visit 10    PT Start Time 6384    PT Stop Time 5364    PT Time Calculation (min) 45 min    Activity Tolerance Patient tolerated treatment well    Behavior During Therapy WFL for tasks assessed/performed             Past Medical History:  Diagnosis Date   Allergy    Arthritis    Chronic back pain    Depression    Diabetes mellitus without complication ([place])    diet controlled   History of carpal tunnel syndrome    Bilateral   Hypertension    Hypothyroidism    Migraine    Obese    Pneumonia    x2   Psoriasis    Sleep apnea    mouth guard    Past Surgical History:  Procedure Laterality Date   ABDOMINAL HYSTERECTOMY     CARPAL TUNNEL RELEASE     CERVICAL FUSION     COLONOSCOPY     FRACTURE SURGERY     tibia fracture   TOTAL KNEE ARTHROPLASTY Left 04/17/2019   Procedure: TOTAL KNEE ARTHROPLASTY;  Surgeon: Vickey Huger, MD;  Location: [facility];  Service: Orthopedics;  Laterality: Left;   UPPER GI ENDOSCOPY      There were no vitals filed for this visit.   Subjective Assessment - 02/07/21 2002     Subjective The pressure that she placed last time made me feel so much better.    Patient Stated Goals pick up grandson    Currently in Pain? No/denies                [facility] PT Assessment - 02/07/21 0001       Palpation   Palpation comment trigger point in Rt upper trap created concordant pain                           [facility] Adult PT  Treatment/Exercise - 02/07/21 0001       Shoulder Exercises: Standing   External Rotation 15 reps    Theraband Level (Shoulder External Rotation) Level 3 (Green)    Extension 10 reps;Both    Theraband Level (Shoulder Extension) Level 3 (Green)    Row 10 reps;Both    Theraband Level (Shoulder Row) Level 3 (Green)      Manual Therapy   Manual therapy comments skilled palpation and monitoring    Joint Mobilization gross rib mobility in prone    Soft tissue mobilization rhomboids, bil upper traps, Rt levator scap              Trigger Point Dry Needling - 02/07/21 0001     Consent Given? Yes    Education Handout Provided Previously provided    Muscles Treated Upper Quadrant Rhomboids    Rhomboids Response Palpable increased muscle length;Twitch response elicited   bil  PT Long Term Goals - 01/14/21 1932       PT LONG TERM GOAL #1   Title able to lift grandson without incr back pain    Baseline pain at eval    Time 6    Period Weeks    Status New    Target Date 02/28/21      PT LONG TERM GOAL #2   Title independent in long term stretching/exercise program    Baseline to be progressed and est    Time 6    Period Weeks    Status New    Target Date 02/28/21      PT LONG TERM GOAL #3   Title able to demo proper bend/lift form for objects on floor    Baseline will educate    Time 6    Period Weeks    Status New    Target Date 02/28/21      PT LONG TERM GOAL #4   Title able to demo proper postural alignment wihtout cuing    Baseline will educate    Time 6    Period Weeks    Status New    Target Date 02/28/21                   Plan - 02/07/21 2003     Clinical Impression Statement Dn created twitch responses and manual therapy improved rib cage mobility. cuing required for scapular mobility rather than GHJ. impingement reported on rt-side of neck in door pec stretch.    PT Treatment/Interventions Aquatic  Therapy;Cryotherapy;Moist Heat;Iontophoresis 4mg /ml Dexamethasone;ADLs/Self Care Home Management;Electrical Stimulation;Functional mobility training;Neuromuscular re-education;Therapeutic exercise;Therapeutic activities;Patient/family education;Manual techniques;Dry needling;Passive range of motion;Taping;Spinal Manipulations    PT Next Visit Plan progress periscap strengthenging, lifting-postural training when lifting grand son    PT Home Exercise Plan [id number]    Consulted and Agree with Plan of Care Patient             Patient will benefit from skilled therapeutic intervention in order to improve the following deficits and impairments:  Increased muscle spasms, Improper body mechanics, Decreased activity tolerance, Decreased strength, Impaired UE functional use, Postural dysfunction, Pain  Visit Diagnosis: Pain in thoracic spine  Abnormal posture     Problem List Patient Active Problem List   Diagnosis Date Noted   S/P total knee replacement 04/17/2019   Hypothyroidism 04/17/2016   Morbid obesity due to excess calories ([place]) 12/31/2015   Ventral hernia 10/20/2015   Essential hypertension 10/16/2015   Cough variant asthma vs UACS  10/15/2015   Diabetes mellitus type 2 in obese ([place]) 05/11/2007   Hyperlipidemia 05/11/2007   DEPRESSION 05/11/2007   ASTHMA 05/11/2007   Migraine 05/11/2007   URINARY INCONTINENCE 05/11/2007   Liani Caris C. Cherree Conerly PT, DPT 02/07/21 8:11 PM   [place], [place], [address] Phone: [phone]   Fax:  [phone]  Name: Caitlin Stevenson MRN: [id number] Date of Birth: 1950/12/31

## 2021-02-10 ENCOUNTER — Encounter (HOSPITAL_BASED_OUTPATIENT_CLINIC_OR_DEPARTMENT_OTHER): Payer: Self-pay | Admitting: Physical Therapy

## 2021-02-10 ENCOUNTER — Ambulatory Visit (HOSPITAL_BASED_OUTPATIENT_CLINIC_OR_DEPARTMENT_OTHER): Payer: Medicare Other | Admitting: Physical Therapy

## 2021-02-10 ENCOUNTER — Other Ambulatory Visit: Payer: Self-pay

## 2021-02-10 DIAGNOSIS — R293 Abnormal posture: Secondary | ICD-10-CM

## 2021-02-10 DIAGNOSIS — M546 Pain in thoracic spine: Secondary | ICD-10-CM | POA: Diagnosis not present

## 2021-02-11 ENCOUNTER — Encounter (HOSPITAL_BASED_OUTPATIENT_CLINIC_OR_DEPARTMENT_OTHER): Payer: Self-pay | Admitting: Physical Therapy

## 2021-02-11 NOTE — Therapy (Signed)
[facility] [address] [place], [place], [address] Phone: [phone]   Fax:  [phone]  Physical Therapy Treatment  Patient Details  Name: Caitlin Stevenson MRN: [id number] Date of Birth: November 22, 1950 Referring Provider (PT): Karenann Cai, [place]   Encounter Date: 02/10/2021   PT End of Session - 02/11/21 1033     Visit Number 4    Number of Visits 13    Date for PT Re-Evaluation 02/28/21    PT Start Time 1600    PT Stop Time 1640    PT Time Calculation (min) 40 min    Activity Tolerance Patient tolerated treatment well    Behavior During Therapy WFL for tasks assessed/performed             Past Medical History:  Diagnosis Date   Allergy    Arthritis    Chronic back pain    Depression    Diabetes mellitus without complication ([place])    diet controlled   History of carpal tunnel syndrome    Bilateral   Hypertension    Hypothyroidism    Migraine    Obese    Pneumonia    x2   Psoriasis    Sleep apnea    mouth guard    Past Surgical History:  Procedure Laterality Date   ABDOMINAL HYSTERECTOMY     CARPAL TUNNEL RELEASE     CERVICAL FUSION     COLONOSCOPY     FRACTURE SURGERY     tibia fracture   TOTAL KNEE ARTHROPLASTY Left 04/17/2019   Procedure: TOTAL KNEE ARTHROPLASTY;  Surgeon: Vickey Huger, MD;  Location: [facility];  Service: Orthopedics;  Laterality: Left;   UPPER GI ENDOSCOPY      There were no vitals filed for this visit.   Subjective Assessment - 02/10/21 1629     Subjective Patient reports it is getting much better. Her pain is more in her mid thoriacic. She has been working on her stretches and exercsies.    Currently in Pain? No/denies                               [facility] Adult PT Treatment/Exercise - 02/11/21 0001       Shoulder Exercises: Seated   Other Seated Exercises bilateral ER 2x10 red; horizontal abduction 2x10 red; flexion with a band 2x10 green       Shoulder Exercises: Standing   Extension Both;20 reps    Theraband Level (Shoulder Extension) Level 3 (Green)    Row 20 reps    Theraband Level (Shoulder Row) Level 3 (Green)      Manual Therapy   Joint Mobilization throacic PA's    Soft tissue mobilization rhomboids, bil upper traps, Rt levator scap                    PT Education - 02/11/21 1033     Education Details reviewed the benefit of postrual correction and exercises.    Person(s) Educated Patient    Methods Explanation;Demonstration;Verbal cues;Tactile cues    Comprehension Verbalized understanding;Returned demonstration;Verbal cues required;Tactile cues required                 PT Long Term Goals - 01/14/21 1932       PT LONG TERM GOAL #1   Title able to lift grandson without incr back pain    Baseline pain at eval    Time 6  Period Weeks    Status New    Target Date 02/28/21      PT LONG TERM GOAL #2   Title independent in long term stretching/exercise program    Baseline to be progressed and est    Time 6    Period Weeks    Status New    Target Date 02/28/21      PT LONG TERM GOAL #3   Title able to demo proper bend/lift form for objects on floor    Baseline will educate    Time 6    Period Weeks    Status New    Target Date 02/28/21      PT LONG TERM GOAL #4   Title able to demo proper postural alignment wihtout cuing    Baseline will educate    Time 6    Period Weeks    Status New    Target Date 02/28/21                   Plan - 02/10/21 1637     Clinical Impression Statement Patient is making good progress. She hasd an area of spasming on the right side of her mid throacic spine. She reported improved pain with manual therapy. Therapy perfomed trigger point release to the area. She reported improved pain. She was given a seated progression of exercises to perfrom. She was given a red band a geen band to progress to at home. She eclined needling this time but may  consider it next time.    Personal Factors and Comorbidities Time since onset of injury/illness/exacerbation    Examination-Activity Limitations Squat;Lift;Bend;Stand;Carry    Examination-Participation Restrictions Occupation    Stability/Clinical Decision Making Stable/Uncomplicated    Clinical Decision Making Low    Rehab Potential Good    PT Frequency 2x / week    PT Duration 6 weeks    PT Treatment/Interventions Aquatic Therapy;Cryotherapy;Moist Heat;Iontophoresis 4mg /ml Dexamethasone;ADLs/Self Care Home Management;Electrical Stimulation;Functional mobility training;Neuromuscular re-education;Therapeutic exercise;Therapeutic activities;Patient/family education;Manual techniques;Dry needling;Passive range of motion;Taping;Spinal Manipulations    PT Next Visit Plan progress periscap strengthenging, lifting-postural training when lifting grand son    PT Home Exercise Plan [id number]    Consulted and Agree with Plan of Care Patient             Patient will benefit from skilled therapeutic intervention in order to improve the following deficits and impairments:  Increased muscle spasms, Improper body mechanics, Decreased activity tolerance, Decreased strength, Impaired UE functional use, Postural dysfunction, Pain  Visit Diagnosis: Pain in thoracic spine  Abnormal posture     Problem List Patient Active Problem List   Diagnosis Date Noted   S/P total knee replacement 04/17/2019   Hypothyroidism 04/17/2016   Morbid obesity due to excess calories ([place]) 12/31/2015   Ventral hernia 10/20/2015   Essential hypertension 10/16/2015   Cough variant asthma vs UACS  10/15/2015   Diabetes mellitus type 2 in obese ([place]) 05/11/2007   Hyperlipidemia 05/11/2007   DEPRESSION 05/11/2007   ASTHMA 05/11/2007   Migraine 05/11/2007   URINARY INCONTINENCE 05/11/2007    Carney Living PT DPT  02/11/2021, 1:32 PM  [place], [place], [address] Phone: [phone]   Fax:  [phone]  Name: Caitlin Stevenson MRN: [id number] Date of Birth: 1951-05-13

## 2021-02-13 DIAGNOSIS — M13841 Other specified arthritis, right hand: Secondary | ICD-10-CM | POA: Diagnosis not present

## 2021-02-13 DIAGNOSIS — M79644 Pain in right finger(s): Secondary | ICD-10-CM | POA: Diagnosis not present

## 2021-02-14 ENCOUNTER — Ambulatory Visit (HOSPITAL_BASED_OUTPATIENT_CLINIC_OR_DEPARTMENT_OTHER): Payer: Medicare Other | Admitting: Physical Therapy

## 2021-02-14 ENCOUNTER — Other Ambulatory Visit: Payer: Self-pay

## 2021-02-14 ENCOUNTER — Encounter (HOSPITAL_BASED_OUTPATIENT_CLINIC_OR_DEPARTMENT_OTHER): Payer: Self-pay | Admitting: Physical Therapy

## 2021-02-14 DIAGNOSIS — M546 Pain in thoracic spine: Secondary | ICD-10-CM | POA: Diagnosis not present

## 2021-02-14 DIAGNOSIS — R293 Abnormal posture: Secondary | ICD-10-CM | POA: Diagnosis not present

## 2021-02-14 NOTE — Therapy (Signed)
[place], [place], [address] Phone: [phone]   Fax:  [phone]  Physical Therapy Treatment  Patient Details  Name: Caitlin Stevenson MRN: [id number] Date of Birth: [birth year] Referring Provider (PT): Karenann Cai, [place]   Encounter Date: 02/14/2021   PT End of Session - 02/14/21 1606     Visit Number 5    Number of Visits 13    Date for PT Re-Evaluation 02/28/21    Authorization Type MCR    PT Start Time 1602    PT Stop Time 7262    PT Time Calculation (min) 39 min    Activity Tolerance Patient tolerated treatment well    Behavior During Therapy WFL for tasks assessed/performed             Past Medical History:  Diagnosis Date   Allergy    Arthritis    Chronic back pain    Depression    Diabetes mellitus without complication ([place])    diet controlled   History of carpal tunnel syndrome    Bilateral   Hypertension    Hypothyroidism    Migraine    Obese    Pneumonia    x2   Psoriasis    Sleep apnea    mouth guard    Past Surgical History:  Procedure Laterality Date   ABDOMINAL HYSTERECTOMY     CARPAL TUNNEL RELEASE     CERVICAL FUSION     COLONOSCOPY     FRACTURE SURGERY     tibia fracture   TOTAL KNEE ARTHROPLASTY Left 04/17/2019   Procedure: TOTAL KNEE ARTHROPLASTY;  Surgeon: Vickey Huger, MD;  Location: [facility];  Service: Orthopedics;  Laterality: Left;   UPPER GI ENDOSCOPY      There were no vitals filed for this visit.   Subjective Assessment - 02/14/21 1605     Subjective Always a little bit of pain but it is much better.    Patient Stated Goals pick up grandson    Currently in Pain? Yes    Pain Score 1     Pain Location Back    Pain Orientation Mid    Pain Descriptors / Indicators Sore                               [facility] Adult PT Treatment/Exercise - 02/14/21 0001       Shoulder Exercises: Standing   Flexion Limitations Y lower  trap set 1lb    ABduction Limitations to 90 with deltoid circles- palsm down & fwd; 1lb weight    Diagonals Limitations low trap set at wall    Other Standing Exercises row- 20lb machine, 8lb dumbbells    Other Standing Exercises squat to biceps curl with 10lb machine & 8lb dumbells      Manual Therapy   Joint Mobilization gross rib mobs- PA pressure with breathing    Soft tissue mobilization Lt rhomboid, mid trap, paraspinals                         PT Long Term Goals - 01/14/21 1932       PT LONG TERM GOAL #1   Title able to lift grandson without incr back pain    Baseline pain at eval    Time 6    Period Weeks    Status New    Target Date 02/28/21  PT LONG TERM GOAL #2   Title independent in long term stretching/exercise program    Baseline to be progressed and est    Time 6    Period Weeks    Status New    Target Date 02/28/21      PT LONG TERM GOAL #3   Title able to demo proper bend/lift form for objects on floor    Baseline will educate    Time 6    Period Weeks    Status New    Target Date 02/28/21      PT LONG TERM GOAL #4   Title able to demo proper postural alignment wihtout cuing    Baseline will educate    Time 6    Period Weeks    Status New    Target Date 02/28/21                   Plan - 02/14/21 1656     Clinical Impression Statement Used increased weight resistance to challenge thoracic region for scap retraction. She is able to recognize proper postural alignment in seated positions but fatigues.    PT Treatment/Interventions Aquatic Therapy;Cryotherapy;Moist Heat;Iontophoresis 4mg /ml Dexamethasone;ADLs/Self Care Home Management;Electrical Stimulation;Functional mobility training;Neuromuscular re-education;Therapeutic exercise;Therapeutic activities;Patient/family education;Manual techniques;Dry needling;Passive range of motion;Taping;Spinal Manipulations    PT Next Visit Plan manual PRN to rib mobs & thoracic  musculature. endurance for posture/lifting    PT Home Exercise Plan [id number]    Consulted and Agree with Plan of Care Patient             Patient will benefit from skilled therapeutic intervention in order to improve the following deficits and impairments:  Increased muscle spasms, Improper body mechanics, Decreased activity tolerance, Decreased strength, Impaired UE functional use, Postural dysfunction, Pain  Visit Diagnosis: Pain in thoracic spine  Abnormal posture     Problem List Patient Active Problem List   Diagnosis Date Noted   S/P total knee replacement 04/17/2019   Hypothyroidism 04/17/2016   Morbid obesity due to excess calories ([place]) 12/31/2015   Ventral hernia 10/20/2015   Essential hypertension 10/16/2015   Cough variant asthma vs UACS  10/15/2015   Diabetes mellitus type 2 in obese ([place]) 05/11/2007   Hyperlipidemia 05/11/2007   DEPRESSION 05/11/2007   ASTHMA 05/11/2007   Migraine 05/11/2007   URINARY INCONTINENCE 05/11/2007  [name] PT, DPT 02/14/21 5:01 PM   [facility] [place], [place], [address] Phone: [phone]   Fax:  [phone]  Name: Caitlin Stevenson MRN: [id number] Date of Birth: 02-16-1951

## 2021-02-17 ENCOUNTER — Ambulatory Visit (HOSPITAL_BASED_OUTPATIENT_CLINIC_OR_DEPARTMENT_OTHER): Payer: Medicare Other | Admitting: Physical Therapy

## 2021-02-20 ENCOUNTER — Other Ambulatory Visit: Payer: Self-pay

## 2021-02-20 ENCOUNTER — Ambulatory Visit (HOSPITAL_BASED_OUTPATIENT_CLINIC_OR_DEPARTMENT_OTHER): Payer: Medicare Other | Admitting: Physical Therapy

## 2021-02-20 ENCOUNTER — Encounter (HOSPITAL_BASED_OUTPATIENT_CLINIC_OR_DEPARTMENT_OTHER): Payer: Self-pay | Admitting: Physical Therapy

## 2021-02-20 DIAGNOSIS — M546 Pain in thoracic spine: Secondary | ICD-10-CM | POA: Diagnosis not present

## 2021-02-20 DIAGNOSIS — R293 Abnormal posture: Secondary | ICD-10-CM | POA: Diagnosis not present

## 2021-02-20 NOTE — Therapy (Signed)
[facility] [address] [place], [place], [address] Phone: [phone]   Fax:  [phone]  Physical Therapy Treatment  Patient Details  Name: Caitlin Stevenson MRN: [id number] Date of Birth: 04/19/1951 Referring Provider (PT): Mahar, Colleen, PA-C   Encounter Date: 02/20/2021   PT End of Session - 02/20/21 1432     Visit Number 6    Number of Visits 13    Date for PT Re-Evaluation 02/28/21    Authorization Type MCR    Progress Note Due on Visit 10    PT Start Time 1432    PT Stop Time 1503    PT Time Calculation (min) 31 min    Activity Tolerance Patient tolerated treatment well    Behavior During Therapy WFL for tasks assessed/performed             Past Medical History:  Diagnosis Date   Allergy    Arthritis    Chronic back pain    Depression    Diabetes mellitus without complication (HCC)    diet controlled   History of carpal tunnel syndrome    Bilateral   Hypertension    Hypothyroidism    Migraine    Obese    Pneumonia    x2   Psoriasis    Sleep apnea    mouth guard    Past Surgical History:  Procedure Laterality Date   ABDOMINAL HYSTERECTOMY     CARPAL TUNNEL RELEASE     CERVICAL FUSION     COLONOSCOPY     FRACTURE SURGERY     tibia fracture   TOTAL KNEE ARTHROPLASTY Left 04/17/2019   Procedure: TOTAL KNEE ARTHROPLASTY;  Surgeon: Lucey, Steve, MD;  Location: [facility];  Service: Orthopedics;  Laterality: Left;   UPPER GI ENDOSCOPY      There were no vitals filed for this visit.   Subjective Assessment - 02/20/21 1434     Subjective I am feeling good. When I am lifting is when I feel the pain.    Patient Stated Goals pick up grandson    Currently in Pain? No/denies                [facility] PT Assessment - 02/20/21 0001       Posture/Postural Control   Posture Comments dominance in cervical musculature for breathing                           [facility] Adult PT  Treatment/Exercise - 02/20/21 0001       Therapeutic Activites    Therapeutic Activities Lifting    Lifting various shapes and heights, carrying kettle bells to mimick paint cans      Shoulder Exercises: Stretch   Other Shoulder Stretches pec stretch- hands behind back    Other Shoulder Stretches periscap stretch: horiz add, fingers laced kyphosis with breathing, extension over chair                         PT Long Term Goals - 01/14/21 1932       PT LONG TERM GOAL #1   Title able to lift grandson without incr back pain    Baseline pain at eval    Time 6    Period Weeks    Status New    Target Date 02/28/21      PT LONG TERM GOAL #2   Title independent in long term   stretching/exercise program    Baseline to be progressed and est    Time 6    Period Weeks    Status New    Target Date 02/28/21      PT LONG TERM GOAL #3   Title able to demo proper bend/lift form for objects on floor    Baseline will educate    Time 6    Period Weeks    Status New    Target Date 02/28/21      PT LONG TERM GOAL #4   Title able to demo proper postural alignment wihtout cuing    Baseline will educate    Time 6    Period Weeks    Status New    Target Date 02/28/21                   Plan - 02/20/21 1617     Clinical Impression Statement Exercises today focused on functional lifting and carrying with increased weight. Cues for scapular retraction and depression for necessary engagement/support. Notable dominance of cervical musculature for breathing  resulting in difficulty to stretch thoracic region. Discussed stretches to do while seated/standing for longer periods to improve tolerance to upright posture. Next week is the end of her POC and she will benefit from heavier lifting/carrying challenges to check form as well as ensure that she has met all of her functional goals.    PT Treatment/Interventions Aquatic Therapy;Cryotherapy;Moist Heat;Iontophoresis 57m/ml  Dexamethasone;ADLs/Self Care Home Management;Electrical Stimulation;Functional mobility training;Neuromuscular re-education;Therapeutic exercise;Therapeutic activities;Patient/family education;Manual techniques;Dry needling;Passive range of motion;Taping;Spinal Manipulations    PT Next Visit Plan wrap up POC, did she try stretches? re-evaluate lifting & add more weight    PT Home Exercise Plan [id number]    Consulted and Agree with Plan of Care Patient             Patient will benefit from skilled therapeutic intervention in order to improve the following deficits and impairments:  Increased muscle spasms, Improper body mechanics, Decreased activity tolerance, Decreased strength, Impaired UE functional use, Postural dysfunction, Pain  Visit Diagnosis: Pain in thoracic spine  Abnormal posture     Problem List Patient Active Problem List   Diagnosis Date Noted   S/P total knee replacement 04/17/2019   Hypothyroidism 04/17/2016   Mor[place] to [place] ([place] 12/31/2015   Ventral hernia 10/20/2015   Essential hypertension 10/16/2015   Cough variant asthma vs UACS  10/15/2015   Diabetes mellitus type 2 in obese ([place] 05/11/2007   Hyperlipidemia 05/11/2007   DEPRESSION 05/11/2007   ASTHMA 05/11/2007   Migraine 05/11/2007   URINARY INCONTINENCE 05/11/2007   Caitlin Strey C. Alphons Burgert PT, DPT 02/20/21 4:23 PM   [facility] [address] [address]Phone: [phone]  Fax:  [phone] Name: Caitlin TISONMRN: [id number] of Birth: 102/16/1952

## 2021-02-21 ENCOUNTER — Encounter (HOSPITAL_BASED_OUTPATIENT_CLINIC_OR_DEPARTMENT_OTHER): Payer: PRIVATE HEALTH INSURANCE | Admitting: Physical Therapy

## 2021-02-24 ENCOUNTER — Other Ambulatory Visit: Payer: Self-pay

## 2021-02-24 ENCOUNTER — Encounter (HOSPITAL_BASED_OUTPATIENT_CLINIC_OR_DEPARTMENT_OTHER): Payer: Self-pay | Admitting: Physical Therapy

## 2021-02-24 ENCOUNTER — Ambulatory Visit (HOSPITAL_BASED_OUTPATIENT_CLINIC_OR_DEPARTMENT_OTHER): Payer: Medicare Other | Admitting: Physical Therapy

## 2021-02-24 DIAGNOSIS — M546 Pain in thoracic spine: Secondary | ICD-10-CM | POA: Diagnosis not present

## 2021-02-24 DIAGNOSIS — R293 Abnormal posture: Secondary | ICD-10-CM | POA: Diagnosis not present

## 2021-02-25 ENCOUNTER — Encounter (HOSPITAL_BASED_OUTPATIENT_CLINIC_OR_DEPARTMENT_OTHER): Payer: Self-pay | Admitting: Physical Therapy

## 2021-02-25 NOTE — Therapy (Addendum)
[facility] [place], [place], [address] Phone: [phone]   Fax:  [phone]  Physical Therapy Treatment/Discharge  Patient Details  Name: Caitlin Stevenson MRN: [id number] Date of Birth: 07-May-1951 Referring Provider (PT): Karenann Cai, [place]   Encounter Date: 02/24/2021   PT End of Session - 02/25/21 1549     Visit Number 7    Number of Visits 13    Date for PT Re-Evaluation 02/28/21    Authorization Type MCR    PT Start Time 1608   Patient 8 minutes late   PT Stop Time 1646    PT Time Calculation (min) 38 min    Activity Tolerance Patient tolerated treatment well    Behavior During Therapy WFL for tasks assessed/performed             Past Medical History:  Diagnosis Date   Allergy    Arthritis    Chronic back pain    Depression    Diabetes mellitus without complication ([place])    diet controlled   History of carpal tunnel syndrome    Bilateral   Hypertension    Hypothyroidism    Migraine    Obese    Pneumonia    x2   Psoriasis    Sleep apnea    mouth guard    Past Surgical History:  Procedure Laterality Date   ABDOMINAL HYSTERECTOMY     CARPAL TUNNEL RELEASE     CERVICAL FUSION     COLONOSCOPY     FRACTURE SURGERY     tibia fracture   TOTAL KNEE ARTHROPLASTY Left 04/17/2019   Procedure: TOTAL KNEE ARTHROPLASTY;  Surgeon: Vickey Huger, MD;  Location: [facility];  Service: Orthopedics;  Laterality: Left;   UPPER GI ENDOSCOPY      There were no vitals filed for this visit.   Subjective Assessment - 02/24/21 1613     Subjective Patient reports that she is feeling sore especially after work. Pain in the lower back that gets worse after work. Patient states its "exhaustion" pain.    Patient Stated Goals pick up grandson    Currently in Pain? Yes    Pain Score 2     Pain Location Back    Pain Orientation Mid    Pain Descriptors / Indicators Sore    Pain Type Acute pain                                [facility] Adult PT Treatment/Exercise - 02/25/21 0001       Shoulder Exercises: Seated   Other Seated Exercises bilateral ER 2x10 red; horizontal abduction 2x10 red; flexion with a band 2x10 green      Shoulder Exercises: Standing   Row 20 reps    Theraband Level (Shoulder Row) Level 3 (Green)      Shoulder Exercises: ROM/Strengthening   Cybex Row Limitations 2x10 20lbs    Other ROM/Strengthening Exercises chops 2x10 bilateral; up and down; lat pull down 2x10 10lbs;    Other ROM/Strengthening Exercises pallof press 2x10 each direction                         PT Long Term Goals - 01/14/21 1932       PT LONG TERM GOAL #1   Title able to lift grandson without incr back pain    Baseline pain at eval  Time 6    Period Weeks    Status New    Target Date 02/28/21      PT LONG TERM GOAL #2   Title independent in long term stretching/exercise program    Baseline to be progressed and est    Time 6    Period Weeks    Status New    Target Date 02/28/21      PT LONG TERM GOAL #3   Title able to demo proper bend/lift form for objects on floor    Baseline will educate    Time 6    Period Weeks    Status New    Target Date 02/28/21      PT LONG TERM GOAL #4   Title able to demo proper postural alignment wihtout cuing    Baseline will educate    Time 6    Period Weeks    Status New    Target Date 02/28/21                   Plan - 02/25/21 1551     Clinical Impression Statement Patient had more low back pain, but her uper back has resolved. She hopes to some point return to the gym.    Personal Factors and Comorbidities Time since onset of injury/illness/exacerbation    Examination-Activity Limitations Squat;Lift;Bend;Stand;Carry    Examination-Participation Restrictions Occupation    Stability/Clinical Decision Making Stable/Uncomplicated    Clinical Decision Making Low    Rehab Potential Good    PT Frequency  2x / week    PT Duration 6 weeks    PT Treatment/Interventions Aquatic Therapy;Cryotherapy;Moist Heat;Iontophoresis 51m/ml Dexamethasone;ADLs/Self Care Home Management;Electrical Stimulation;Functional mobility training;Neuromuscular re-education;Therapeutic exercise;Therapeutic activities;Patient/family education;Manual techniques;Dry needling;Passive range of motion;Taping;Spinal Manipulations    PT Next Visit Plan wrap up POC, did she try stretches? re-evaluate lifting & add more weight    PT Home Exercise Plan [id number]    Consulted and Agree with Plan of Care Patient             Patient will benefit from skilled therapeutic intervention in order to improve the following deficits and impairments:  Increased muscle spasms, Improper body mechanics, Decreased activity tolerance, Decreased strength, Impaired UE functional use, Postural dysfunction, Pain  Visit Diagnosis: Pain in thoracic spine  Abnormal posture  PHYSICAL THERAPY DISCHARGE SUMMARY  Visits from Start of Care: 7  Current functional level related to goals / functional outcomes: Improved pain; full exercise program   Remaining deficits:    Education / Equipment: HEP    Patient agrees to discharge. Patient goals were met. Patient is being discharged due to meeting the stated rehab goals.    Problem List Patient Active Problem List   Diagnosis Date Noted   S/P total knee replacement 04/17/2019   Hypothyroidism 04/17/2016   [place] ([place] 12/31/2015   Ventral hernia 10/20/2015   Essential hypertension 10/16/2015   Cough variant asthma vs UACS  10/15/2015   Diabetes mellitus type 2 in obese ([facility] 05/11/2007   Hyperlipidemia 05/11/2007   DEPRESSION 05/11/2007   ASTHMA 05/11/2007   Migraine 05/11/2007   URINARY INCONTINENCE 05/11/2007    DCarney LivingPT DPT 02/25/2021, 4:16 PM  [facility] [address]  [address]Phone: [phone]  Fax:  [phone] Name: VJOCHEBED BILLSMRN: [id number] of Birth: 118-Oct-1952

## 2021-02-28 ENCOUNTER — Encounter (HOSPITAL_BASED_OUTPATIENT_CLINIC_OR_DEPARTMENT_OTHER): Payer: PRIVATE HEALTH INSURANCE | Admitting: Physical Therapy

## 2021-03-04 ENCOUNTER — Encounter (HOSPITAL_BASED_OUTPATIENT_CLINIC_OR_DEPARTMENT_OTHER): Payer: PRIVATE HEALTH INSURANCE | Admitting: Physical Therapy

## 2021-03-07 ENCOUNTER — Encounter (HOSPITAL_BASED_OUTPATIENT_CLINIC_OR_DEPARTMENT_OTHER): Payer: PRIVATE HEALTH INSURANCE | Admitting: Physical Therapy

## 2021-04-15 DIAGNOSIS — H25813 Combined forms of age-related cataract, bilateral: Secondary | ICD-10-CM | POA: Diagnosis not present

## 2021-04-15 DIAGNOSIS — H40013 Open angle with borderline findings, low risk, bilateral: Secondary | ICD-10-CM | POA: Diagnosis not present

## 2021-04-22 DIAGNOSIS — H25813 Combined forms of age-related cataract, bilateral: Secondary | ICD-10-CM | POA: Diagnosis not present

## 2021-04-29 DIAGNOSIS — I872 Venous insufficiency (chronic) (peripheral): Secondary | ICD-10-CM | POA: Diagnosis not present

## 2021-04-29 DIAGNOSIS — L308 Other specified dermatitis: Secondary | ICD-10-CM | POA: Diagnosis not present

## 2021-05-12 DIAGNOSIS — H52222 Regular astigmatism, left eye: Secondary | ICD-10-CM | POA: Diagnosis not present

## 2021-05-12 DIAGNOSIS — M1810 Unilateral primary osteoarthritis of first carpometacarpal joint, unspecified hand: Secondary | ICD-10-CM | POA: Diagnosis not present

## 2021-05-12 DIAGNOSIS — Z6835 Body mass index (BMI) 35.0-35.9, adult: Secondary | ICD-10-CM | POA: Diagnosis not present

## 2021-05-12 DIAGNOSIS — E039 Hypothyroidism, unspecified: Secondary | ICD-10-CM | POA: Diagnosis not present

## 2021-05-12 DIAGNOSIS — I10 Essential (primary) hypertension: Secondary | ICD-10-CM | POA: Diagnosis not present

## 2021-05-12 DIAGNOSIS — G4730 Sleep apnea, unspecified: Secondary | ICD-10-CM | POA: Diagnosis not present

## 2021-05-12 DIAGNOSIS — Z79890 Hormone replacement therapy (postmenopausal): Secondary | ICD-10-CM | POA: Diagnosis not present

## 2021-05-12 DIAGNOSIS — Z79899 Other long term (current) drug therapy: Secondary | ICD-10-CM | POA: Diagnosis not present

## 2021-05-12 DIAGNOSIS — H25812 Combined forms of age-related cataract, left eye: Secondary | ICD-10-CM | POA: Diagnosis not present

## 2021-05-12 DIAGNOSIS — E669 Obesity, unspecified: Secondary | ICD-10-CM | POA: Diagnosis not present

## 2021-05-12 DIAGNOSIS — Z9119 Patient's noncompliance with other medical treatment and regimen: Secondary | ICD-10-CM | POA: Diagnosis not present

## 2021-05-26 DIAGNOSIS — E039 Hypothyroidism, unspecified: Secondary | ICD-10-CM | POA: Diagnosis not present

## 2021-05-26 DIAGNOSIS — I10 Essential (primary) hypertension: Secondary | ICD-10-CM | POA: Diagnosis not present

## 2021-05-26 DIAGNOSIS — Z79899 Other long term (current) drug therapy: Secondary | ICD-10-CM | POA: Diagnosis not present

## 2021-05-26 DIAGNOSIS — Z79890 Hormone replacement therapy (postmenopausal): Secondary | ICD-10-CM | POA: Diagnosis not present

## 2021-05-26 DIAGNOSIS — G4730 Sleep apnea, unspecified: Secondary | ICD-10-CM | POA: Diagnosis not present

## 2021-05-26 DIAGNOSIS — E669 Obesity, unspecified: Secondary | ICD-10-CM | POA: Diagnosis not present

## 2021-05-26 DIAGNOSIS — M1810 Unilateral primary osteoarthritis of first carpometacarpal joint, unspecified hand: Secondary | ICD-10-CM | POA: Diagnosis not present

## 2021-05-26 DIAGNOSIS — Z6836 Body mass index (BMI) 36.0-36.9, adult: Secondary | ICD-10-CM | POA: Diagnosis not present

## 2021-05-26 DIAGNOSIS — Z9842 Cataract extraction status, left eye: Secondary | ICD-10-CM | POA: Diagnosis not present

## 2021-05-26 DIAGNOSIS — H52221 Regular astigmatism, right eye: Secondary | ICD-10-CM | POA: Diagnosis not present

## 2021-05-26 DIAGNOSIS — H25811 Combined forms of age-related cataract, right eye: Secondary | ICD-10-CM | POA: Diagnosis not present

## 2021-05-26 DIAGNOSIS — Z961 Presence of intraocular lens: Secondary | ICD-10-CM | POA: Diagnosis not present

## 2021-06-05 DIAGNOSIS — L820 Inflamed seborrheic keratosis: Secondary | ICD-10-CM | POA: Diagnosis not present

## 2021-06-05 DIAGNOSIS — L309 Dermatitis, unspecified: Secondary | ICD-10-CM | POA: Diagnosis not present

## 2021-06-11 DIAGNOSIS — J45991 Cough variant asthma: Secondary | ICD-10-CM | POA: Diagnosis not present

## 2021-06-11 DIAGNOSIS — F329 Major depressive disorder, single episode, unspecified: Secondary | ICD-10-CM | POA: Diagnosis not present

## 2021-06-11 DIAGNOSIS — E559 Vitamin D deficiency, unspecified: Secondary | ICD-10-CM | POA: Diagnosis not present

## 2021-06-11 DIAGNOSIS — E039 Hypothyroidism, unspecified: Secondary | ICD-10-CM | POA: Diagnosis not present

## 2021-06-11 DIAGNOSIS — G43909 Migraine, unspecified, not intractable, without status migrainosus: Secondary | ICD-10-CM | POA: Diagnosis not present

## 2021-06-11 DIAGNOSIS — E669 Obesity, unspecified: Secondary | ICD-10-CM | POA: Diagnosis not present

## 2021-06-11 DIAGNOSIS — R7303 Prediabetes: Secondary | ICD-10-CM | POA: Diagnosis not present

## 2021-06-11 DIAGNOSIS — E785 Hyperlipidemia, unspecified: Secondary | ICD-10-CM | POA: Diagnosis not present

## 2021-06-11 DIAGNOSIS — I10 Essential (primary) hypertension: Secondary | ICD-10-CM | POA: Diagnosis not present

## 2021-07-06 DIAGNOSIS — Z20828 Contact with and (suspected) exposure to other viral communicable diseases: Secondary | ICD-10-CM | POA: Diagnosis not present

## 2021-07-07 DIAGNOSIS — Z6836 Body mass index (BMI) 36.0-36.9, adult: Secondary | ICD-10-CM | POA: Diagnosis not present

## 2021-07-07 DIAGNOSIS — Z01419 Encounter for gynecological examination (general) (routine) without abnormal findings: Secondary | ICD-10-CM | POA: Diagnosis not present

## 2021-07-07 DIAGNOSIS — N951 Menopausal and female climacteric states: Secondary | ICD-10-CM | POA: Diagnosis not present

## 2021-07-07 DIAGNOSIS — Z1231 Encounter for screening mammogram for malignant neoplasm of breast: Secondary | ICD-10-CM | POA: Diagnosis not present

## 2021-07-09 ENCOUNTER — Other Ambulatory Visit: Payer: Self-pay | Admitting: Obstetrics and Gynecology

## 2021-07-09 DIAGNOSIS — R928 Other abnormal and inconclusive findings on diagnostic imaging of breast: Secondary | ICD-10-CM

## 2021-07-10 DIAGNOSIS — M5416 Radiculopathy, lumbar region: Secondary | ICD-10-CM | POA: Diagnosis not present

## 2021-07-10 DIAGNOSIS — M546 Pain in thoracic spine: Secondary | ICD-10-CM | POA: Diagnosis not present

## 2021-07-10 DIAGNOSIS — M549 Dorsalgia, unspecified: Secondary | ICD-10-CM | POA: Diagnosis not present

## 2021-07-17 DIAGNOSIS — M9902 Segmental and somatic dysfunction of thoracic region: Secondary | ICD-10-CM | POA: Diagnosis not present

## 2021-07-17 DIAGNOSIS — M9903 Segmental and somatic dysfunction of lumbar region: Secondary | ICD-10-CM | POA: Diagnosis not present

## 2021-07-17 DIAGNOSIS — M9905 Segmental and somatic dysfunction of pelvic region: Secondary | ICD-10-CM | POA: Diagnosis not present

## 2021-07-18 DIAGNOSIS — M9902 Segmental and somatic dysfunction of thoracic region: Secondary | ICD-10-CM | POA: Diagnosis not present

## 2021-07-18 DIAGNOSIS — M9905 Segmental and somatic dysfunction of pelvic region: Secondary | ICD-10-CM | POA: Diagnosis not present

## 2021-07-18 DIAGNOSIS — M9903 Segmental and somatic dysfunction of lumbar region: Secondary | ICD-10-CM | POA: Diagnosis not present

## 2021-07-21 DIAGNOSIS — M9903 Segmental and somatic dysfunction of lumbar region: Secondary | ICD-10-CM | POA: Diagnosis not present

## 2021-07-21 DIAGNOSIS — M9902 Segmental and somatic dysfunction of thoracic region: Secondary | ICD-10-CM | POA: Diagnosis not present

## 2021-07-21 DIAGNOSIS — M9905 Segmental and somatic dysfunction of pelvic region: Secondary | ICD-10-CM | POA: Diagnosis not present

## 2021-07-30 ENCOUNTER — Other Ambulatory Visit: Payer: Self-pay | Admitting: Rehabilitation

## 2021-07-30 DIAGNOSIS — M9903 Segmental and somatic dysfunction of lumbar region: Secondary | ICD-10-CM | POA: Diagnosis not present

## 2021-07-30 DIAGNOSIS — M5416 Radiculopathy, lumbar region: Secondary | ICD-10-CM

## 2021-07-30 DIAGNOSIS — M9905 Segmental and somatic dysfunction of pelvic region: Secondary | ICD-10-CM | POA: Diagnosis not present

## 2021-07-30 DIAGNOSIS — M9902 Segmental and somatic dysfunction of thoracic region: Secondary | ICD-10-CM | POA: Diagnosis not present

## 2021-08-04 ENCOUNTER — Other Ambulatory Visit: Payer: Self-pay

## 2021-08-04 ENCOUNTER — Ambulatory Visit
Admission: RE | Admit: 2021-08-04 | Discharge: 2021-08-04 | Disposition: A | Discharge status: Home / Self Care | Payer: Medicare Other | Source: Ambulatory Visit | Attending: Obstetrics and Gynecology | Admitting: Obstetrics and Gynecology

## 2021-08-04 DIAGNOSIS — R928 Other abnormal and inconclusive findings on diagnostic imaging of breast: Secondary | ICD-10-CM

## 2021-08-04 DIAGNOSIS — R922 Inconclusive mammogram: Secondary | ICD-10-CM | POA: Diagnosis not present

## 2021-08-06 DIAGNOSIS — M9902 Segmental and somatic dysfunction of thoracic region: Secondary | ICD-10-CM | POA: Diagnosis not present

## 2021-08-06 DIAGNOSIS — M9903 Segmental and somatic dysfunction of lumbar region: Secondary | ICD-10-CM | POA: Diagnosis not present

## 2021-08-06 DIAGNOSIS — M9905 Segmental and somatic dysfunction of pelvic region: Secondary | ICD-10-CM | POA: Diagnosis not present

## 2021-08-08 DIAGNOSIS — M5416 Radiculopathy, lumbar region: Secondary | ICD-10-CM | POA: Diagnosis not present

## 2021-08-12 ENCOUNTER — Other Ambulatory Visit: Payer: Medicare Other

## 2021-08-13 DIAGNOSIS — M9905 Segmental and somatic dysfunction of pelvic region: Secondary | ICD-10-CM | POA: Diagnosis not present

## 2021-08-13 DIAGNOSIS — M9903 Segmental and somatic dysfunction of lumbar region: Secondary | ICD-10-CM | POA: Diagnosis not present

## 2021-08-13 DIAGNOSIS — M9902 Segmental and somatic dysfunction of thoracic region: Secondary | ICD-10-CM | POA: Diagnosis not present

## 2021-08-14 DIAGNOSIS — M5416 Radiculopathy, lumbar region: Secondary | ICD-10-CM | POA: Diagnosis not present

## 2021-08-20 DIAGNOSIS — M5416 Radiculopathy, lumbar region: Secondary | ICD-10-CM | POA: Diagnosis not present

## 2021-08-22 DIAGNOSIS — M5416 Radiculopathy, lumbar region: Secondary | ICD-10-CM | POA: Diagnosis not present

## 2021-08-26 ENCOUNTER — Inpatient Hospital Stay: Admission: RE | Admit: 2021-08-26 | Payer: Medicare Other | Source: Ambulatory Visit

## 2021-08-26 ENCOUNTER — Other Ambulatory Visit: Payer: Self-pay | Admitting: Rehabilitation

## 2021-08-26 DIAGNOSIS — M5416 Radiculopathy, lumbar region: Secondary | ICD-10-CM

## 2021-08-27 ENCOUNTER — Ambulatory Visit
Admission: RE | Admit: 2021-08-27 | Discharge: 2021-08-27 | Disposition: A | Discharge status: Home / Self Care | Payer: Medicare Other | Source: Ambulatory Visit | Attending: Rehabilitation | Admitting: Rehabilitation

## 2021-08-27 ENCOUNTER — Other Ambulatory Visit: Payer: Self-pay

## 2021-08-27 DIAGNOSIS — M545 Low back pain, unspecified: Secondary | ICD-10-CM | POA: Diagnosis not present

## 2021-08-27 DIAGNOSIS — M5416 Radiculopathy, lumbar region: Secondary | ICD-10-CM | POA: Diagnosis not present

## 2021-08-28 DIAGNOSIS — M5416 Radiculopathy, lumbar region: Secondary | ICD-10-CM | POA: Diagnosis not present

## 2021-09-02 DIAGNOSIS — M5416 Radiculopathy, lumbar region: Secondary | ICD-10-CM | POA: Diagnosis not present

## 2021-09-04 DIAGNOSIS — M5416 Radiculopathy, lumbar region: Secondary | ICD-10-CM | POA: Diagnosis not present

## 2021-09-10 DIAGNOSIS — M5416 Radiculopathy, lumbar region: Secondary | ICD-10-CM | POA: Diagnosis not present

## 2021-09-18 DIAGNOSIS — M5416 Radiculopathy, lumbar region: Secondary | ICD-10-CM | POA: Diagnosis not present

## 2021-09-18 DIAGNOSIS — M5116 Intervertebral disc disorders with radiculopathy, lumbar region: Secondary | ICD-10-CM | POA: Diagnosis not present

## 2021-09-26 DIAGNOSIS — J189 Pneumonia, unspecified organism: Secondary | ICD-10-CM | POA: Diagnosis not present

## 2021-10-08 ENCOUNTER — Ambulatory Visit (INDEPENDENT_AMBULATORY_CARE_PROVIDER_SITE_OTHER): Payer: Medicare Other | Admitting: Pulmonary Disease

## 2021-10-08 ENCOUNTER — Other Ambulatory Visit: Payer: Self-pay

## 2021-10-08 ENCOUNTER — Encounter: Payer: Self-pay | Admitting: Pulmonary Disease

## 2021-10-08 VITALS — BP 124/62 | HR 77 | Temp 98.4°F | Ht 65.0 in | Wt 206.0 lb

## 2021-10-08 DIAGNOSIS — J4540 Moderate persistent asthma, uncomplicated: Secondary | ICD-10-CM

## 2021-10-08 MED ORDER — ARFORMOTEROL TARTRATE 15 MCG/2ML IN NEBU: 15.0000 ug | INHALATION_SOLUTION | Freq: Two times a day (BID) | RESPIRATORY_TRACT | 0 refills | Status: DC

## 2021-10-08 MED ORDER — PREDNISONE 20 MG PO TABS: 40.0000 mg | ORAL_TABLET | Freq: Every day | ORAL | 0 refills | Status: AC

## 2021-10-08 MED ORDER — BUDESONIDE 0.5 MG/2ML IN SUSP: 0.5000 mg | Freq: Two times a day (BID) | RESPIRATORY_TRACT | 0 refills | Status: DC

## 2021-10-08 NOTE — Patient Instructions (Addendum)
Nice to meet you  I am sorry you continue to have such severe coughing fits  I recommend prednisone 40 mg daily x5 days, take in the mornings  I recommend trialing nebulized inhaled corticosteroid (budesonide) and long-acting beta agonist (arformoterol), to help treat ongoing inflammation and tightness in the airways that could be contributing to cough  We will get a chest x-ray at your next visit to make sure the abnormalities that were reported on 09/26/2021 have improved.  Return to clinic in 4 weeks or sooner as needed with Dr. Silas Flood

## 2021-10-08 NOTE — Progress Notes (Signed)
@Patient  ID: Caitlin Stevenson, female    DOB: [birth year], 71 y.o.   MRN: [id number]  Chief Complaint  Patient presents with   Consult    Consult for PNA. Pt states that she has cough and SOB noted. Been going on for about 1 month now. Went to urgent care and was given prednisone, cough syrup and antibiotics.     Referring provider: Reynold Bowen, MD  HPI:   71 y.o. woman whom we are seeing in consultation for evaluation of cough.  Urgent care note 09/26/2021 reviewed.  Most recent note from pulmonary provider, Dr. Melvyn Novas 2017 reviewed.  Patient with normal state of health.  Grandson contracted upper respiratory illness early to mid 2023 January.  Whole family got similar crud.  Initially dry cough..  Lingered for a couple of weeks.  Most family was including original patient, grandchild, improved.  Barky, spasmodic cough continued.  Urgent care visit 09/26/2021.  Diagnosed with pneumonia based on chest x-ray.  Prescribed duo nebs as well as cefdinir and azithromycin.  No real improvement.  Had a course of prednisone on hand, 65432 1 pill taper.  Mild improvement in terms of severity.  Was able to sleep last night so somewhat better at night.  No other relieving or exacerbating factors.  She has taken Mucinex with minimal relief.  Dextromethorphan/promethazine cough syrup without relief.  She reports history of asthma.  Only when on nonselective beta-blocker.  When that was discontinued her asthma symptoms resolved.  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, former NP, lives in [place] / Pulmonary Flowsheets:   ACT:  No flowsheet data found.  MMRC: No flowsheet data found.  Epworth:  No flowsheet data found.  Tests:   FENO:  Lab Results  Component Value Date   NITRICOXIDE 21 12/31/2015    PFT: No flowsheet data  found.  WALK:  No flowsheet data found.  Imaging: Personally reviewed  Lab Results: Personally reviewed CBC    Component Value Date/Time   WBC 12.3 (H) 04/18/2019 0242   RBC 3.81 (L) 04/18/2019 0242   HGB 12.2 04/18/2019 0242   HCT 36.1 04/18/2019 0242   PLT 233 04/18/2019 0242   MCV 94.8 04/18/2019 0242   MCH 32.0 04/18/2019 0242   MCHC 33.8 04/18/2019 0242   RDW 12.3 04/18/2019 0242   LYMPHSABS 1.7 04/12/2019 1028   MONOABS 0.5 04/12/2019 1028   EOSABS 0.2 04/12/2019 1028   BASOSABS 0.1 04/12/2019 1028    BMET    Component Value Date/Time   NA 136 04/18/2019 0242   K 4.4 04/18/2019 0242   CL 105 04/18/2019 0242   CO2 24 04/18/2019 0242   GLUCOSE 133 (H) 04/18/2019 0242   BUN 16 04/18/2019 0242   CREATININE 0.67 04/18/2019 0242   CALCIUM 8.2 (L) 04/18/2019 0242   GFRNONAA >60 04/18/2019 0242   GFRAA >60 04/18/2019 0242    BNP No results found for: BNP  ProBNP No results found for: PROBNP  Specialty Problems       Pulmonary Problems   Cough variant asthma vs UACS     NO 10/15/2015  = 51 - Spirometry 10/15/2015 wnl including [id number] with last saba > 8 h prior - 12/11/2015  After extensive coaching HFA effectiveness =    75% > challenge with dulera 100 2bid > stopped late April 2017 p improved off tenormin - added flutter valve 12/11/2015 with  cyclical cough regimen - NO 12/31/2015   = 18 off dulera > ok to leave off  - Rec trial of gabapentin 12/31/2015 > f/u Dr Joya Gaskins at [facility] prn        Allergies  Allergen Reactions   Atenolol     Esophageal Spasms   Minocycline Hcl     Migraines and Dizziness   Morphine Sulfate Nausea And Vomiting   Amoxicillin Rash    Did it involve swelling of the face/tongue/throat, SOB, or low BP? No Did it involve sudden or severe rash/hives, skin peeling, or any reaction on the inside of your mouth or nose? No Did you need to seek medical attention at a hospital or doctor's office? No When did it last happen?    Childhood  allergy   If all above answers are NO, may proceed with cephalosporin use.    Penicillins Rash    Did it involve swelling of the face/tongue/throat, SOB, or low BP? No Did it involve sudden or severe rash/hives, skin peeling, or any reaction on the inside of your mouth or nose? No Did you need to seek medical attention at a hospital or doctor's office? No When did it last happen?    Childhood allergy   If all above answers are "NO", may proceed with cephalosporin use.    Immunization History  Administered Date(s) Administered   Influenza Split 06/17/2015   Influenza-Unspecified 05/31/2018   PPD Test 09/24/2014, 02/04/2016   Pneumococcal Conjugate-13 04/17/2016   Td 09/01/2003   Tdap 04/17/2016   Zoster, Live 10/16/2003    Past Medical History:  Diagnosis Date   Allergy    Arthritis    Chronic back pain    Depression    Diabetes mellitus without complication ([place])    diet controlled   History of carpal tunnel syndrome    Bilateral   Hypertension    Hypothyroidism    Migraine    Obese    Pneumonia    x2   Psoriasis    Sleep apnea    mouth guard    Tobacco History: Social History   Tobacco Use  Smoking Status Never  Smokeless Tobacco Never   Counseling given: Not Answered   Continue to not smoke  Outpatient Encounter Medications as of 10/08/2021  Medication Sig   albuterol (VENTOLIN HFA) 108 (90 Base) MCG/ACT inhaler Inhale into the lungs.   arformoterol (BROVANA) 15 MCG/2ML NEBU Take 2 mLs (15 mcg total) by nebulization 2 (two) times daily.   budesonide (PULMICORT) 0.5 MG/2ML nebulizer solution Take 2 mLs (0.5 mg total) by nebulization 2 (two) times daily.   estradiol (VIVELLE-DOT) 0.1 MG/24HR patch Place 1 patch onto the skin 2 (two) times a week.   halobetasol (ULTRAVATE) 0.05 % ointment Apply topically 2 (two) times daily. (Patient taking differently: Apply 1 application topically 2 (two) times daily as needed (itching).)   levothyroxine (SYNTHROID,  LEVOTHROID) 137 MCG tablet Take 1 tablet (137 mcg total) by mouth daily before breakfast. Need annual appointment with labs for further refills   LORazepam (ATIVAN) 1 MG tablet Take 1 mg by mouth 3 (three) times daily as needed for anxiety.   methocarbamol (ROBAXIN) 500 MG tablet Take 1-2 tablets (500-1,000 mg total) by mouth every 6 (six) hours as needed for muscle spasms.   olmesartan-hydrochlorothiazide (BENICAR HCT) 20-12.5 MG tablet Take 1 tablet by mouth at bedtime.   Omega-3 1000 MG CAPS Take 2,000-3,000 mg by mouth daily.   predniSONE (DELTASONE) 20 MG tablet Take 2  tablets (40 mg total) by mouth daily with breakfast for 5 days.   progesterone (PROMETRIUM) 100 MG capsule Take 100 mg by mouth daily.   SUMAtriptan (IMITREX) 100 MG tablet Take 1 tablet (100 mg total) by mouth every 2 (two) hours as needed for migraine. May repeat in 2 hours if headache persists or recurs.   zinc gluconate 50 MG tablet Take 50 mg by mouth daily.   zolpidem (AMBIEN) 10 MG tablet Take 10 mg by mouth at bedtime as needed for sleep.    [DISCONTINUED] aspirin EC 325 MG EC tablet Take 1 tablet (325 mg total) by mouth 2 (two) times daily.   [DISCONTINUED] gabapentin (NEURONTIN) 100 MG capsule Take 1 capsule (100 mg total) by mouth 3 (three) times daily. One three times daily (Patient not taking: Reported on 10/05/2016)   [DISCONTINUED] hydrochlorothiazide (MICROZIDE) 12.5 MG capsule Take 1 capsule (12.5 mg total) by mouth daily. Need annual appointment with labs for further refills (Patient not taking: Reported on 04/10/2019)   [DISCONTINUED] levothyroxine (SYNTHROID, LEVOTHROID) 137 MCG tablet TAKE ONE TABLET BY MOUTH DAILY BEFORE BREAKFAST (Patient taking differently: Take 68.5-137 mcg by mouth See admin instructions. Take 137 mcg by mouth before breakfast Monday - Friday and take 68.5 mcg on Saturday.)   [DISCONTINUED] montelukast (SINGULAIR) 10 MG tablet TK 1 T PO QPM   [DISCONTINUED] neomycin-polymyxin-hydrocortisone  (CORTISPORIN) 3.5-10000-1 OTIC suspension Place 3 drops into both ears 3 (three) times daily. (Patient not taking: Reported on 04/10/2019)   [DISCONTINUED] oxyCODONE (OXY IR/ROXICODONE) 5 MG immediate release tablet Take 1-2 tablets (5-10 mg total) by mouth every 6 (six) hours as needed for moderate pain (pain score 4-6).   [DISCONTINUED] pantoprazole (PROTONIX) 40 MG tablet Take 1 tablet (40 mg total) by mouth daily. Take 30-60 min before first meal of the day (Patient not taking: Reported on 10/05/2016)   [DISCONTINUED] Respiratory Therapy Supplies (FLUTTER) DEVI Use as directed   No facility-administered encounter medications on file as of 10/08/2021.     Review of Systems  Review of Systems  No chest pain with exertion.  No orthopnea or PND.  No lower extremity swelling.  Comprehensive review of systems otherwise negative. Physical Exam  BP 124/62 (BP Location: Left Arm, Patient Position: Sitting, Cuff Size: Normal)    Pulse 77    Temp 98.4 F (36.9 C) (Oral)    Ht 5\' 5"  (1.651 m)    Wt 206 lb (93.4 kg)    SpO2 96%    BMI 34.28 kg/m   Wt Readings from Last 5 Encounters:  10/08/21 206 lb (93.4 kg)  04/17/19 208 lb (94.3 kg)  04/12/19 208 lb (94.3 kg)  05/13/17 204 lb (92.5 kg)  10/05/16 208 lb (94.3 kg)    BMI Readings from Last 5 Encounters:  10/08/21 34.28 kg/m  04/17/19 34.61 kg/m  04/12/19 34.61 kg/m  05/13/17 33.95 kg/m  10/05/16 34.61 kg/m     Physical Exam General: Well-appearing, sitting in chair Eyes: EOMI, icterus Neck: Supple, no JVP Pulmonary: Normal work of breathing, no expiratory wheeze Cardiovascular: Regular in rhythm, no murmur Abdomen: Nondistended, bowel sounds present MSK: No synovitis, no joint effusion Neuro: Normal gait, no weakness Psych: Normal mood, full affect   Assessment & Plan:   Cough: Productive, spasmodic.  Following clear description of viral infection about 4 weeks prior.  Suspect postviral cough, reactive airways disease.   Potential RSV or metapneumovirus which can cause lingering cough and spasm for weeks.  Counseled the same.  Not much improved  with previous oral steroid course.  We will repeat prednisone 40 mg daily x5 days.  High-dose ICS via nebulizer and LABA via nebulizer.  Plan repeat chest x-ray in 4 weeks.  Reportedly abnormal with bilateral lower lobe streaky infiltrates 09/26/2021.  Important to evaluate for resolution of infiltrates and establish new baseline.   Return in about 4 weeks (around 11/05/2021).   Lanier Clam, MD 10/08/2021

## 2021-10-24 DIAGNOSIS — M545 Low back pain, unspecified: Secondary | ICD-10-CM | POA: Diagnosis not present

## 2021-10-24 DIAGNOSIS — M5416 Radiculopathy, lumbar region: Secondary | ICD-10-CM | POA: Diagnosis not present

## 2021-10-29 DIAGNOSIS — Z20822 Contact with and (suspected) exposure to covid-19: Secondary | ICD-10-CM | POA: Diagnosis not present

## 2021-11-04 DIAGNOSIS — M5416 Radiculopathy, lumbar region: Secondary | ICD-10-CM | POA: Diagnosis not present

## 2021-11-04 DIAGNOSIS — M545 Low back pain, unspecified: Secondary | ICD-10-CM | POA: Diagnosis not present

## 2021-11-10 ENCOUNTER — Telehealth: Payer: Self-pay | Admitting: Pulmonary Disease

## 2021-11-10 ENCOUNTER — Ambulatory Visit: Payer: Medicare Other

## 2021-11-10 ENCOUNTER — Other Ambulatory Visit: Payer: Self-pay

## 2021-11-10 ENCOUNTER — Ambulatory Visit (INDEPENDENT_AMBULATORY_CARE_PROVIDER_SITE_OTHER): Payer: Medicare Other | Admitting: Pulmonary Disease

## 2021-11-10 ENCOUNTER — Ambulatory Visit: Payer: Medicare Other | Admitting: Pulmonary Disease

## 2021-11-10 ENCOUNTER — Encounter: Payer: Self-pay | Admitting: Pulmonary Disease

## 2021-11-10 VITALS — BP 126/70 | HR 77 | Temp 98.5°F | Ht 65.0 in | Wt 205.2 lb

## 2021-11-10 DIAGNOSIS — R918 Other nonspecific abnormal finding of lung field: Secondary | ICD-10-CM | POA: Diagnosis not present

## 2021-11-10 DIAGNOSIS — J4520 Mild intermittent asthma, uncomplicated: Secondary | ICD-10-CM

## 2021-11-10 MED ORDER — FLUTICASONE FUROATE-VILANTEROL 100-25 MCG/ACT IN AEPB: 1.0000 | INHALATION_SPRAY | Freq: Every day | RESPIRATORY_TRACT | 11 refills | Status: DC

## 2021-11-10 NOTE — Progress Notes (Signed)
$'@Patient'n$  ID: Harvie Bridge, female    DOB: 09/13/1950, 71 y.o.   MRN: [id number]  Chief Complaint  Patient presents with   Follow-up    Follow up. Pt states that her asthma is doing really well right now with no issues.     Referring provider: Reynold Bowen, MD  HPI:   71 y.o. woman whom we are seeing in follow up  for evaluation of cough.    Overall, greatly improved.  Prednisone helped immensely.  About 2 more weeks of fatigue.  That gradually has cleared.  Discussed concern for possible underlying asthma given how severe case of bronchitis as well as.  Also possibly due to particular viruses that can have lingering symptoms such as RSV or metapneumovirus.  Discussed role and rationale for ICS/LABA therapy moving forward.  HPI at initial visit: Patient with normal state of health.  Grandson contracted upper respiratory illness early to mid 2023 January.  Whole family got similar crud.  Initially dry cough..  Lingered for a couple of weeks.  Most family was including original patient, grandchild, improved.  Barky, spasmodic cough continued.  Urgent care visit 09/26/2021.  Diagnosed with pneumonia based on chest x-ray.  Prescribed duo nebs as well as cefdinir and azithromycin.  No real improvement.  Had a course of prednisone on hand, 65432 1 pill taper.  Mild improvement in terms of severity.  Was able to sleep last night so somewhat better at night.  No other relieving or exacerbating factors.  She has taken Mucinex with minimal relief.  Dextromethorphan/promethazine cough syrup without relief.  She reports history of asthma.  Only when on nonselective beta-blocker.  When that was discontinued her asthma symptoms resolved.  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  MMRC: No flowsheet data found.  Epworth:  No flowsheet data found.  Tests:   FENO:  Lab Results  Component Value Date   NITRICOXIDE 21 12/31/2015    PFT: No flowsheet data found.  WALK:  No flowsheet data found.  Imaging: Personally reviewed  Lab Results: Personally reviewed CBC    Component Value Date/Time   WBC 12.3 (H) 04/18/2019 0242   RBC 3.81 (L) 04/18/2019 0242   HGB 12.2 04/18/2019 0242   HCT 36.1 04/18/2019 0242   PLT 233 04/18/2019 0242   MCV 94.8 04/18/2019 0242   MCH 32.0 04/18/2019 0242   MCHC 33.8 04/18/2019 0242   RDW 12.3 04/18/2019 0242   LYMPHSABS 1.7 04/12/2019 1028   MONOABS 0.5 04/12/2019 1028   EOSABS 0.2 04/12/2019 1028   BASOSABS 0.1 04/12/2019 1028    BMET    Component Value Date/Time   NA 136 04/18/2019 0242   K 4.4 04/18/2019 0242   CL 105 04/18/2019 0242   CO2 24 04/18/2019 0242   GLUCOSE 133 (H) 04/18/2019 0242   BUN 16 04/18/2019 0242   CREATININE 0.67 04/18/2019 0242   CALCIUM 8.2 (L) 04/18/2019 0242   GFRNONAA >60 04/18/2019 0242   GFRAA >60 04/18/2019 0242    BNP No results found for: BNP  ProBNP No results found for: PROBNP  Specialty Problems       Pulmonary Problems   Cough variant asthma vs UACS     NO 10/15/2015  = 50 - Spirometry 10/15/2015 wnl including fef25-75 with last saba >  8 h prior - 12/11/2015  After extensive coaching HFA effectiveness =    75% > challenge with dulera 100 2bid > stopped late April 2017 p improved off tenormin - added flutter valve 2/68/3419 with cyclical cough regimen - NO 12/31/2015   = 17 off dulera > ok to leave off  - Rec trial of gabapentin 12/31/2015 > f/u Dr Joya Gaskins at [facility] prn        Allergies  Allergen Reactions   Atenolol     Esophageal Spasms   Minocycline Hcl     Migraines and Dizziness   Morphine Sulfate Nausea And Vomiting   Amoxicillin Rash    Did it involve swelling of the face/tongue/throat, SOB, or low BP? No Did it involve  sudden or severe rash/hives, skin peeling, or any reaction on the inside of your mouth or nose? No Did you need to seek medical attention at a hospital or doctor's office? No When did it last happen?    Childhood allergy   If all above answers are NO, may proceed with cephalosporin use.    Penicillins Rash    Did it involve swelling of the face/tongue/throat, SOB, or low BP? No Did it involve sudden or severe rash/hives, skin peeling, or any reaction on the inside of your mouth or nose? No Did you need to seek medical attention at a hospital or doctor's office? No When did it last happen?    Childhood allergy   If all above answers are "NO", may proceed with cephalosporin use.    Immunization History  Administered Date(s) Administered   Influenza Split 06/17/2015   Influenza-Unspecified 05/31/2018   PPD Test 09/24/2014, 02/04/2016   Pneumococcal Conjugate-13 04/17/2016   Td 09/01/2003   Tdap 04/17/2016   Zoster, Live 10/16/2003    Past Medical History:  Diagnosis Date   Allergy    Arthritis    Chronic back pain    Depression    Diabetes mellitus without complication ([place])    diet controlled   History of carpal tunnel syndrome    Bilateral   Hypertension    Hypothyroidism    Migraine    Obese    Pneumonia    x2   Psoriasis    Sleep apnea    mouth guard    Tobacco History: Social History   Tobacco Use  Smoking Status Never  Smokeless Tobacco Never   Counseling given: Not Answered   Continue to not smoke  Outpatient Encounter Medications as of 11/10/2021  Medication Sig   estradiol (VIVELLE-DOT) 0.1 MG/24HR patch Place 1 patch onto the skin 2 (two) times a week.   fluticasone furoate-vilanterol (BREO ELLIPTA) 100-25 MCG/ACT AEPB Inhale 1 puff into the lungs daily.   halobetasol (ULTRAVATE) 0.05 % ointment Apply topically 2 (two) times daily. (Patient taking differently: Apply 1 application. topically 2 (two) times daily as needed (itching).)   levothyroxine  (SYNTHROID, LEVOTHROID) 137 MCG tablet Take 1 tablet (137 mcg total) by mouth daily before breakfast. Need annual appointment with labs for further refills   LORazepam (ATIVAN) 1 MG tablet Take 1 mg by mouth 3 (three) times daily as needed for anxiety.   methocarbamol (ROBAXIN) 500 MG tablet Take 1-2 tablets (500-1,000 mg total) by mouth every 6 (six) hours as needed for muscle spasms.   olmesartan-hydrochlorothiazide (BENICAR HCT) 20-12.5 MG tablet Take 1 tablet by mouth at bedtime.   Omega-3 1000 MG CAPS Take 2,000-3,000 mg by mouth daily.   progesterone (PROMETRIUM) 100 MG capsule Take 100  mg by mouth daily.   SUMAtriptan (IMITREX) 100 MG tablet Take 1 tablet (100 mg total) by mouth every 2 (two) hours as needed for migraine. May repeat in 2 hours if headache persists or recurs.   zinc gluconate 50 MG tablet Take 50 mg by mouth daily.   zolpidem (AMBIEN) 10 MG tablet Take 10 mg by mouth at bedtime as needed for sleep.    [DISCONTINUED] arformoterol (BROVANA) 15 MCG/2ML NEBU Take 2 mLs (15 mcg total) by nebulization 2 (two) times daily. (Patient not taking: Reported on 11/10/2021)   [DISCONTINUED] budesonide (PULMICORT) 0.5 MG/2ML nebulizer solution Take 2 mLs (0.5 mg total) by nebulization 2 (two) times daily. (Patient not taking: Reported on 11/10/2021)   No facility-administered encounter medications on file as of 11/10/2021.     Review of Systems  Review of Systems  No chest pain with exertion.  No orthopnea or PND.  No lower extremity swelling.  Comprehensive review of systems otherwise negative. Physical Exam  BP 126/70 (BP Location: Left Arm, Patient Position: Sitting, Cuff Size: Normal)    Pulse 77    Temp 98.5 F (36.9 C) (Oral)    Ht '5\' 5"'$  (1.651 m)    Wt 205 lb 3.2 oz (93.1 kg)    SpO2 98%    BMI 34.15 kg/m   Wt Readings from Last 5 Encounters:  11/10/21 205 lb 3.2 oz (93.1 kg)  10/08/21 206 lb (93.4 kg)  04/17/19 208 lb (94.3 kg)  04/12/19 208 lb (94.3 kg)  05/13/17 204 lb  (92.5 kg)    BMI Readings from Last 5 Encounters:  11/10/21 34.15 kg/m  10/08/21 34.28 kg/m  04/17/19 34.61 kg/m  04/12/19 34.61 kg/m  05/13/17 33.95 kg/m     Physical Exam General: Well-appearing, sitting in chair Eyes: EOMI, icterus Neck: Supple, no JVP Pulmonary: Normal work of breathing, no expiratory wheeze Cardiovascular: Regular in rhythm, no murmur Abdomen: Nondistended, bowel sounds present MSK: No synovitis, no joint effusion Neuro: Normal gait, no weakness Psych: Normal mood, full affect   Assessment & Plan:   Cough: Productive, spasmodic.  Following clear description of viral infection about 4 weeks prior.  Suspect postviral cough, reactive airways disease.  Potential RSV or metapneumovirus which can cause lingering cough and spasm for weeks.  Repeated prednisone 40 mg daily x5 days with great effect.  High-dose ICS via nebulizer and LABA via nebulizer.  These were never delivered.  New prescription Breo 1 puff daily.   Return in about 1 year (around 11/11/2022).   Lanier Clam, MD 11/10/2021

## 2021-11-10 NOTE — Patient Instructions (Signed)
Nice to see you again ? ?I am glad things are better ? ?I think it is reasonable to try a daily inhaler to keep inflammation at [place] and try to prevent bad bronchitis like you had earlier this year. ? ?Use Breo 1 puff daily.  Rinse your mouth after every use.  I picked the lowest dose of steroid. ? ?If the co-pay is too high, please call us and we will find a solution.  Sometimes we position the manufactures to help with the cost. ? ?Return to clinic in 1 year or sooner if needed with Dr. Silas Flood ?

## 2021-11-11 ENCOUNTER — Other Ambulatory Visit (HOSPITAL_COMMUNITY): Payer: Self-pay

## 2021-11-11 NOTE — Telephone Encounter (Signed)
Lm for patient.  

## 2021-11-11 NOTE — Telephone Encounter (Signed)
Pt has an unmet deductible currently of ~$505. Test billing for ICS+LABA are as follows: ?Advair Disckus- $444.38 ?Advair HFA- $457.70 ?Breo-$445.99 ?[facility]- Product/Service Not Covered/Non on Formulary  ?Symbicort- $440.48 ?[place] Not Covered/Non on Formulary (prefers Advair Diskus) ? ? ?

## 2021-11-11 NOTE — Telephone Encounter (Signed)
Spoke to patient. She stated that Memory Dance is not affordable. She would like to apply of patient assistance, however she has not paid 600 dollars out of pocket for meds this year.  ? ? ?PA, can you guys help with this? Is there a cheaper alternative?  ? ?

## 2021-11-12 ENCOUNTER — Other Ambulatory Visit (HOSPITAL_COMMUNITY): Payer: Self-pay

## 2021-11-12 ENCOUNTER — Telehealth: Payer: Self-pay | Admitting: Pulmonary Disease

## 2021-11-12 DIAGNOSIS — M545 Low back pain, unspecified: Secondary | ICD-10-CM | POA: Diagnosis not present

## 2021-11-12 DIAGNOSIS — M5416 Radiculopathy, lumbar region: Secondary | ICD-10-CM | POA: Diagnosis not present

## 2021-11-12 NOTE — Telephone Encounter (Signed)
Attempted to call patient back to go over the inhalers and prices that our PA team found for her. Left message for her to call office back  ?

## 2021-11-12 NOTE — Telephone Encounter (Signed)
Lm x2 for patient.  Will close encounter per office protocol.   

## 2021-11-13 NOTE — Telephone Encounter (Signed)
Spoke to patient and relayed test claim info. ?She stated that her deductible is not 505 dollars. She would like to call insurance to verify this information.  ?Nothing further needed at this time.  ? ?

## 2021-11-20 DIAGNOSIS — M545 Low back pain, unspecified: Secondary | ICD-10-CM | POA: Diagnosis not present

## 2021-11-20 DIAGNOSIS — M5416 Radiculopathy, lumbar region: Secondary | ICD-10-CM | POA: Diagnosis not present

## 2021-11-25 ENCOUNTER — Encounter: Payer: Self-pay | Admitting: Pulmonary Disease

## 2021-11-25 ENCOUNTER — Other Ambulatory Visit: Payer: Self-pay

## 2021-11-25 ENCOUNTER — Ambulatory Visit (INDEPENDENT_AMBULATORY_CARE_PROVIDER_SITE_OTHER): Payer: Medicare Other | Admitting: Pulmonary Disease

## 2021-11-25 VITALS — BP 118/64 | HR 77 | Temp 98.8°F | Ht 65.0 in | Wt 208.0 lb

## 2021-11-25 DIAGNOSIS — R053 Chronic cough: Secondary | ICD-10-CM

## 2021-11-25 MED ORDER — FLUTICASONE-SALMETEROL 232-14 MCG/ACT IN AEPB: 1.0000 | INHALATION_SPRAY | Freq: Two times a day (BID) | RESPIRATORY_TRACT | 11 refills | Status: DC

## 2021-11-25 MED ORDER — HYDROCODONE BIT-HOMATROP MBR 5-1.5 MG/5ML PO SOLN: 5.0000 mL | Freq: Every evening | ORAL | 0 refills | Status: DC | PRN

## 2021-11-25 NOTE — Progress Notes (Signed)
? ?'@Patient'$  ID: Caitlin Stevenson, female    DOB: October 25, 1950, 71 y.o.   MRN: [id number] ? ?Chief Complaint  ?Patient presents with  ? Follow-up  ?  New cough and cold noted. Pt has been using Albuterol. The Memory Dance is too expensive and never got it.   ? ? ?Referring provider: ?Reynold Bowen, MD ? ?HPI:  ? ?71 y.o. woman whom we are seeing in follow up  for evaluation of cough.   ? ?Now back with what sounds like bronchitis. Lingering cough, chest congestion after recent viral illness .Was all better at last visit. Did not get Breo as too expensive. All ICS LABA inhalers 400-500 dollars per our pharmacy team. She does not want to pay deductible nor the required money to qualify for manufacturer assistance. ? ?HPI at initial visit: ?Patient with normal state of health.  Grandson contracted upper respiratory illness early to mid 2023 January.  Whole family got similar crud.  Initially dry cough..  Lingered for a couple of weeks.  Most family was including original patient, grandchild, improved.  Barky, spasmodic cough continued.  Urgent care visit 09/26/2021.  Diagnosed with pneumonia based on chest x-ray.  Prescribed duo nebs as well as cefdinir and azithromycin.  No real improvement.  Had a course of prednisone on hand, 65432 1 pill taper.  Mild improvement in terms of severity.  Was able to sleep last night so somewhat better at night.  No other relieving or exacerbating factors.  She has taken Mucinex with minimal relief.  Dextromethorphan/promethazine cough syrup without relief. ? ?She reports history of asthma.  Only when on nonselective beta-blocker.  When that was discontinued her asthma symptoms resolved. ? 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 ?Questionaires / Pulmonary Flowsheets:  ? ?ACT:  ?   ? View : No data to display.  ?  ?  ?   ? ? ?MMRC: ?   ? View : No data to display.  ?  ?  ?  ? ? ?Epworth:  ?   ? View : No data to display.  ?  ?  ?  ? ? ?Tests:  ? ?FENO:  ?Lab Results  ?Component Value Date  ? NITRICOXIDE 21 12/31/2015  ? ? ?PFT: ?   ? View : No data to display.  ?  ?  ?  ? ? ?WALK:  ?   ? View : No data to display.  ?  ?  ?  ? ? ?Imaging: ?Personally reviewed ? ?Lab Results: ?Personally reviewed ?CBC ?   ?Component Value Date/Time  ? WBC 12.3 (H) 04/18/2019 0242  ? RBC 3.81 (L) 04/18/2019 0242  ? HGB 12.2 04/18/2019 0242  ? HCT 36.1 04/18/2019 0242  ? PLT 233 04/18/2019 0242  ? MCV 94.8 04/18/2019 0242  ? MCH 32.0 04/18/2019 0242  ? MCHC 33.8 04/18/2019 0242  ? RDW 12.3 04/18/2019 0242  ? LYMPHSABS 1.7 04/12/2019 1028  ? MONOABS 0.5 04/12/2019 1028  ? EOSABS 0.2 04/12/2019 1028  ? BASOSABS 0.1 04/12/2019 1028  ? ? ?BMET ?   ?Component Value Date/Time  ? NA 136 04/18/2019 0242  ? K 4.4 04/18/2019 0242  ? CL 105 04/18/2019 0242  ? CO2 24 04/18/2019 0242  ? GLUCOSE 133 (H) 04/18/2019 0242  ? BUN 16 04/18/2019 0242  ? CREATININE 0.67 04/18/2019 0242  ? CALCIUM 8.2 (L) 04/18/2019  [place] >60 04/18/2019 0242  ? GFRAA >60 04/18/2019 0242  ? ? ?BNP ?No results found for: BNP ? ?ProBNP ?No results found for: PROBNP ? ?Specialty Problems   ? ?  ? Pulmonary Problems  ? Cough variant asthma vs UACS   ?  NO 10/15/2015  = 50 ?- Spirometry 10/15/2015 wnl including fef25-75 with last saba > 8 h prior ?- 12/11/2015  After extensive coaching HFA effectiveness =    75% > challenge with dulera 100 2bid > stopped late April 2017 p improved off tenormin ?- added flutter valve 04/26/785 with cyclical cough regimen ?- NO 12/31/2015   = 17 off dulera > ok to leave off  ?- Rec trial of gabapentin 12/31/2015 > f/u Dr Joya Gaskins at [facility] prn  ?  ?  ? ? ?Allergies  ?Allergen Reactions  ? Atenolol   ?  Esophageal Spasms  ? Minocycline Hcl   ?  Migraines and Dizziness  ? Morphine Sulfate Nausea And Vomiting  ? Amoxicillin Rash  ?  Did it involve swelling of the  face/tongue/throat, SOB, or low BP? No ?Did it involve sudden or severe rash/hives, skin peeling, or any reaction on the inside of your mouth or nose? No ?Did you need to seek medical attention at a hospital or doctor's office? No ?When did it last happen?    Childhood allergy   ?If all above answers are ?NO?, may proceed with cephalosporin use. ?  ? Penicillins Rash  ?  Did it involve swelling of the face/tongue/throat, SOB, or low BP? No ?Did it involve sudden or severe rash/hives, skin peeling, or any reaction on the inside of your mouth or nose? No ?Did you need to seek medical attention at a hospital or doctor's office? No ?When did it last happen?    Childhood allergy   ?If all above answers are "NO", may proceed with cephalosporin use.  ? ? ?Immunization History  ?Administered Date(s) Administered  ? Influenza Split 06/17/2015  ? Influenza-Unspecified 05/31/2018  ? PPD Test 09/24/2014, 02/04/2016  ? Pneumococcal Conjugate-13 04/17/2016  ? Td 09/01/2003  ? Tdap 04/17/2016  ? Zoster, Live 10/16/2003  ? ? ?Past Medical History:  ?Diagnosis Date  ? Allergy   ? Arthritis   ? Chronic back pain   ? Depression   ? Diabetes mellitus without complication ([place])   ? diet controlled  ? History of carpal tunnel syndrome   ? Bilateral  ? Hypertension   ? Hypothyroidism   ? Migraine   ? Obese   ? Pneumonia   ? x2  ? Psoriasis   ? Sleep apnea   ? mouth guard  ? ? ?Tobacco History: ?Social History  ? ?Tobacco Use  ?Smoking Status Never  ?Smokeless Tobacco Never  ? ?Counseling given: Not Answered ? ? ?Continue to not smoke ? ?Outpatient Encounter Medications as of 11/25/2021  ?Medication Sig  ? estradiol (VIVELLE-DOT) 0.1 MG/24HR patch Place 1 patch onto the skin 2 (two) times a week.  ? Fluticasone-Salmeterol (AIRDUO RESPICLICK 754/49) 201-00 MCG/ACT AEPB Inhale 1 puff into the lungs 2 (two) times daily.  ? halobetasol (ULTRAVATE) 0.05 % ointment Apply topically 2 (two) times daily. (Patient taking differently: Apply 1  application. topically 2 (two) times daily as needed (itching).)  ? HYDROcodone bit-homatropine (HYDROMET) 5-1.5 MG/5ML syrup Take 5 mLs by mouth at bedtime as needed for cough.  ? levothyroxine (SYNTHROID, LEVOTHROID) 137 MCG tablet Take 1 tablet (137 mcg total) by mouth daily before breakfast.  Need annual appointment with labs for further refills  ? LORazepam (ATIVAN) 1 MG tablet Take 1 mg by mouth 3 (three) times daily as needed for anxiety.  ? methocarbamol (ROBAXIN) 500 MG tablet Take 1-2 tablets (500-1,000 mg total) by mouth every 6 (six) hours as needed for muscle spasms.  ? olmesartan-hydrochlorothiazide (BENICAR HCT) 20-12.5 MG tablet Take 1 tablet by mouth at bedtime.  ? progesterone (PROMETRIUM) 100 MG capsule Take 100 mg by mouth daily.  ? SUMAtriptan (IMITREX) 100 MG tablet Take 1 tablet (100 mg total) by mouth every 2 (two) hours as needed for migraine. May repeat in 2 hours if headache persists or recurs.  ? zolpidem (AMBIEN) 10 MG tablet Take 10 mg by mouth at bedtime as needed for sleep.   ? [DISCONTINUED] fluticasone furoate-vilanterol (BREO ELLIPTA) 100-25 MCG/ACT AEPB Inhale 1 puff into the lungs daily.  ? [DISCONTINUED] Omega-3 1000 MG CAPS Take 2,000-3,000 mg by mouth daily.  ? [DISCONTINUED] zinc gluconate 50 MG tablet Take 50 mg by mouth daily.  ? ?No facility-administered encounter medications on file as of 11/25/2021.  ? ? ? ?Review of Systems ? ?Review of Systems  ?N/a ?Physical Exam ? ?BP 118/64 (BP Location: Left Arm, Patient Position: Sitting, Cuff Size: Normal)   Pulse 77   Temp 98.8 ?F (37.1 ?C) (Oral)   Ht '5\' 5"'$  (1.651 m)   Wt 208 lb (94.3 kg)   SpO2 95%   BMI 34.61 kg/m?  ? ?Wt Readings from Last 5 Encounters:  ?11/25/21 208 lb (94.3 kg)  ?11/10/21 205 lb 3.2 oz (93.1 kg)  ?10/08/21 206 lb (93.4 kg)  ?04/17/19 208 lb (94.3 kg)  ?04/12/19 208 lb (94.3 kg)  ? ? ?BMI Readings from Last 5 Encounters:  ?11/25/21 34.61 kg/m?  ?11/10/21 34.15 kg/m?  ?10/08/21 34.28 kg/m?  ?04/17/19  34.61 kg/m?  ?04/12/19 34.61 kg/m?  ? ? ? ?Physical Exam ?General: Well-appearing, sitting in chair ?Eyes: EOMI, icterus ?Neck: Supple, no JVP ?Pulmonary: Normal work of breathing, no expiratory wheeze ?Cardiovascular: Reg

## 2021-11-25 NOTE — Patient Instructions (Signed)
Lets try the airduo with the copay card ? ?This is 1 puff twice a day, rinse mouth after every use.  ? ?Use albuterol every 4-6 hours as needed - ok to use until we figure out next steps with different inhaler.  ? ?I sent a small supply of cough syrup ? ?RTC in 3 months or sooner as needed ?

## 2021-11-27 DIAGNOSIS — Z20822 Contact with and (suspected) exposure to covid-19: Secondary | ICD-10-CM | POA: Diagnosis not present

## 2021-12-04 DIAGNOSIS — M5416 Radiculopathy, lumbar region: Secondary | ICD-10-CM | POA: Diagnosis not present

## 2021-12-04 DIAGNOSIS — M545 Low back pain, unspecified: Secondary | ICD-10-CM | POA: Diagnosis not present

## 2021-12-12 DIAGNOSIS — M545 Low back pain, unspecified: Secondary | ICD-10-CM | POA: Diagnosis not present

## 2021-12-12 DIAGNOSIS — M5416 Radiculopathy, lumbar region: Secondary | ICD-10-CM | POA: Diagnosis not present

## 2021-12-13 DIAGNOSIS — Z20822 Contact with and (suspected) exposure to covid-19: Secondary | ICD-10-CM | POA: Diagnosis not present

## 2021-12-17 DIAGNOSIS — Z20822 Contact with and (suspected) exposure to covid-19: Secondary | ICD-10-CM | POA: Diagnosis not present

## 2021-12-29 DIAGNOSIS — Z20822 Contact with and (suspected) exposure to covid-19: Secondary | ICD-10-CM | POA: Diagnosis not present

## 2021-12-30 DIAGNOSIS — Z20822 Contact with and (suspected) exposure to covid-19: Secondary | ICD-10-CM | POA: Diagnosis not present

## 2022-01-05 IMAGING — US US BREAST*L* LIMITED INC AXILLA
1 series · 5 of 5 positions shown · non-contrast
Comparison: Previous exam(s).

CLINICAL DATA: 70-year-old female for further evaluation of
possible LEFT breast asymmetry on screening mammogram.

EXAM:
DIGITAL DIAGNOSTIC UNILATERAL LEFT MAMMOGRAM WITH TOMOSYNTHESIS AND
CAD; ULTRASOUND LEFT BREAST LIMITED
TECHNIQUE: Left digital diagnostic mammography and breast tomosynthesis was
performed. The images were evaluated with computer-aided detection.;
Targeted ultrasound examination of the left breast was performed.

[Series 1: us breast*left* limited inc axilla · 0.06mm/px · 5 of 5 slices shown]
[im 1/5]
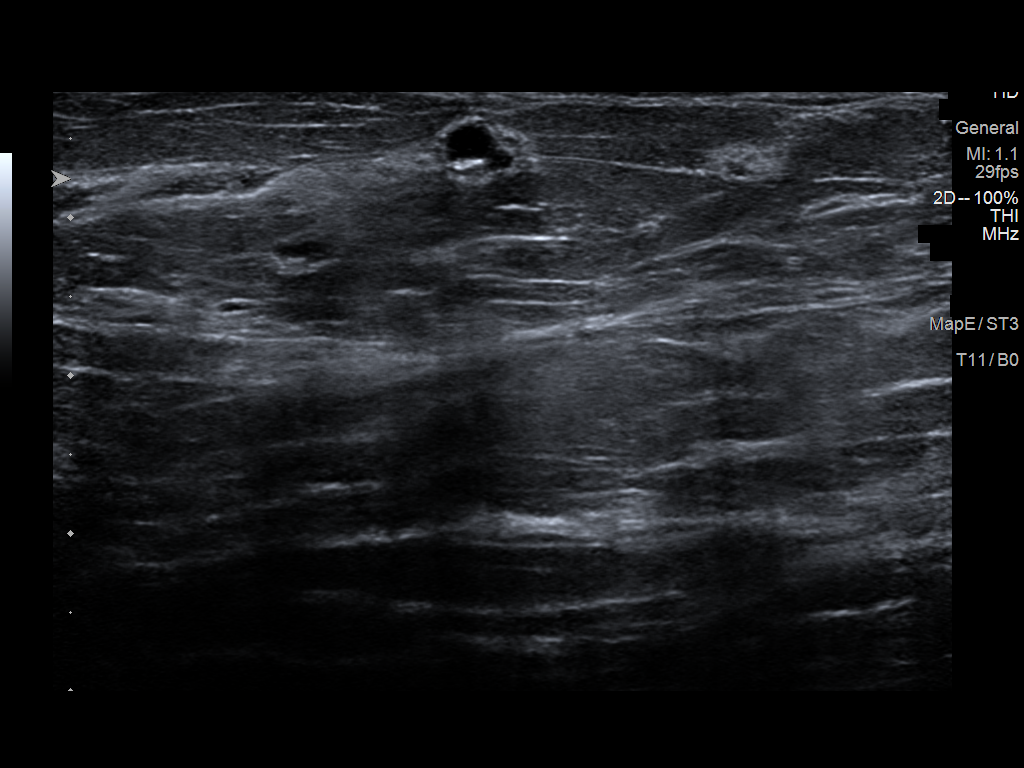
[im 2/5]
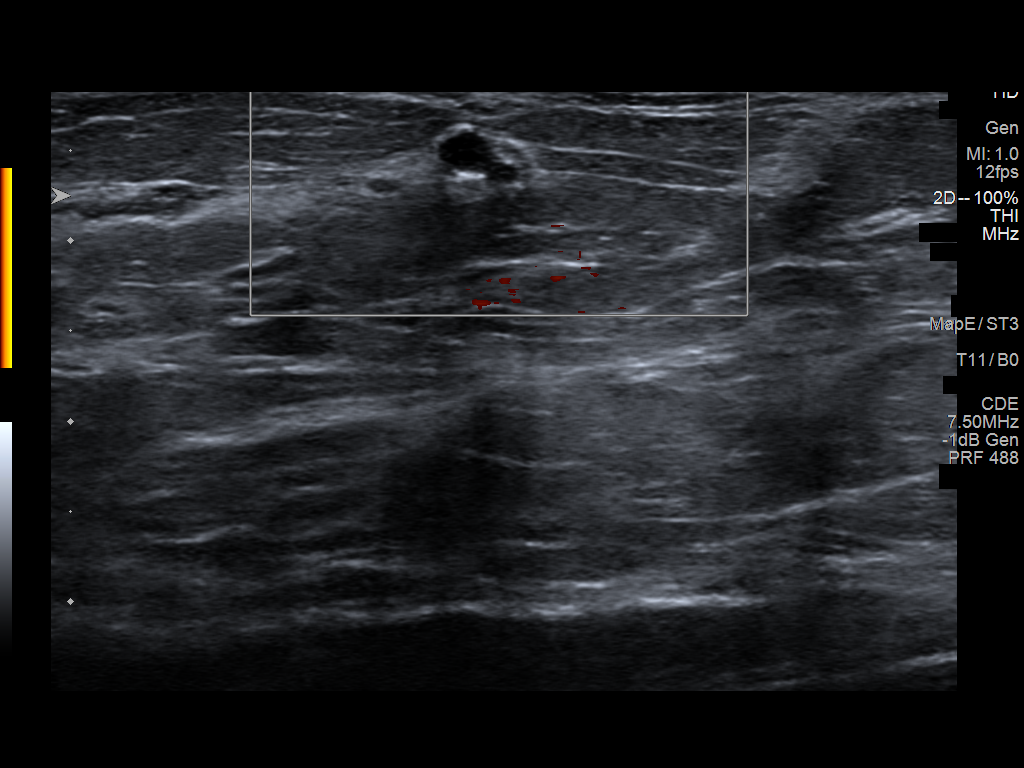
[im 3/5]
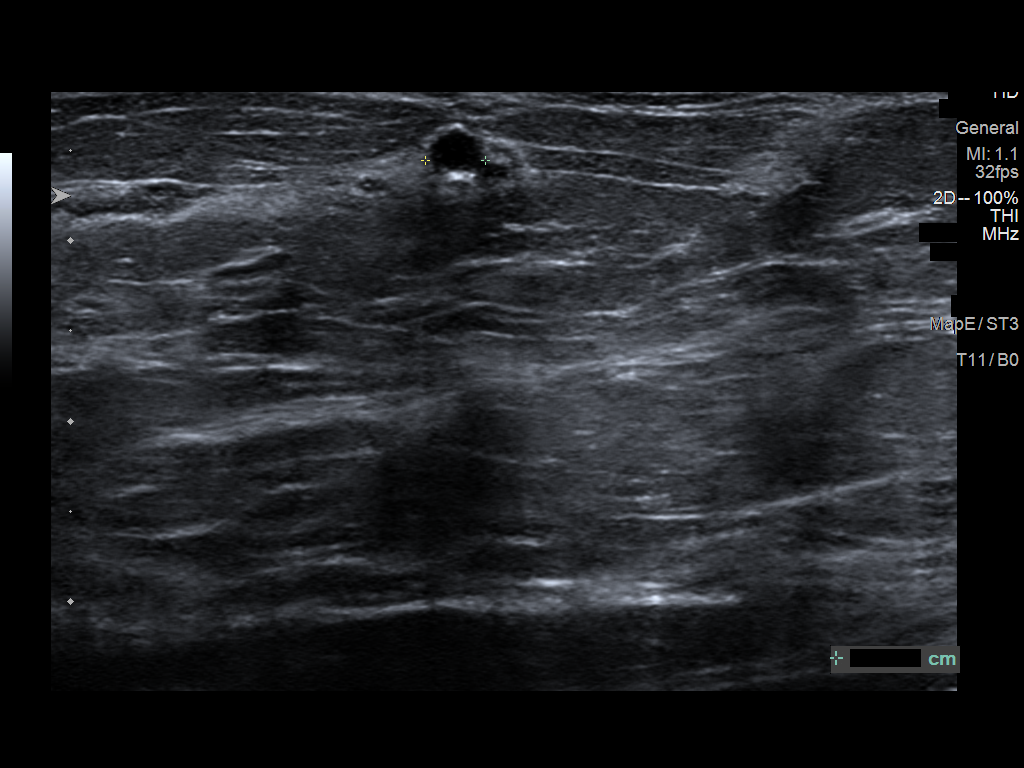
[im 4/5]
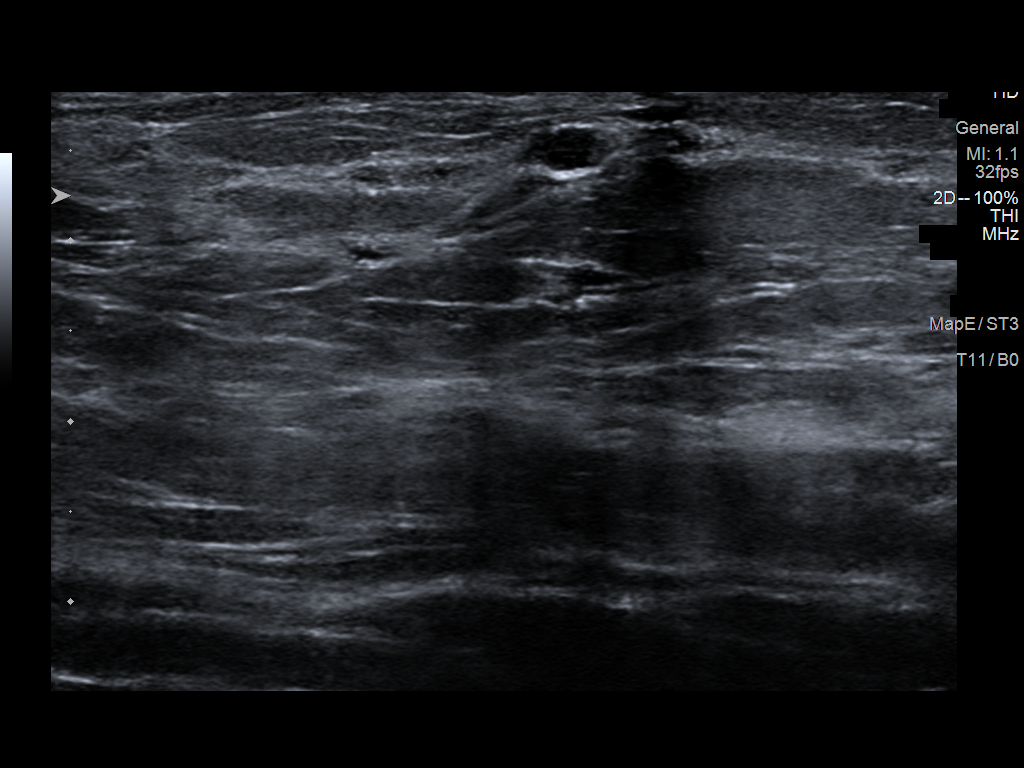
[im 5/5]
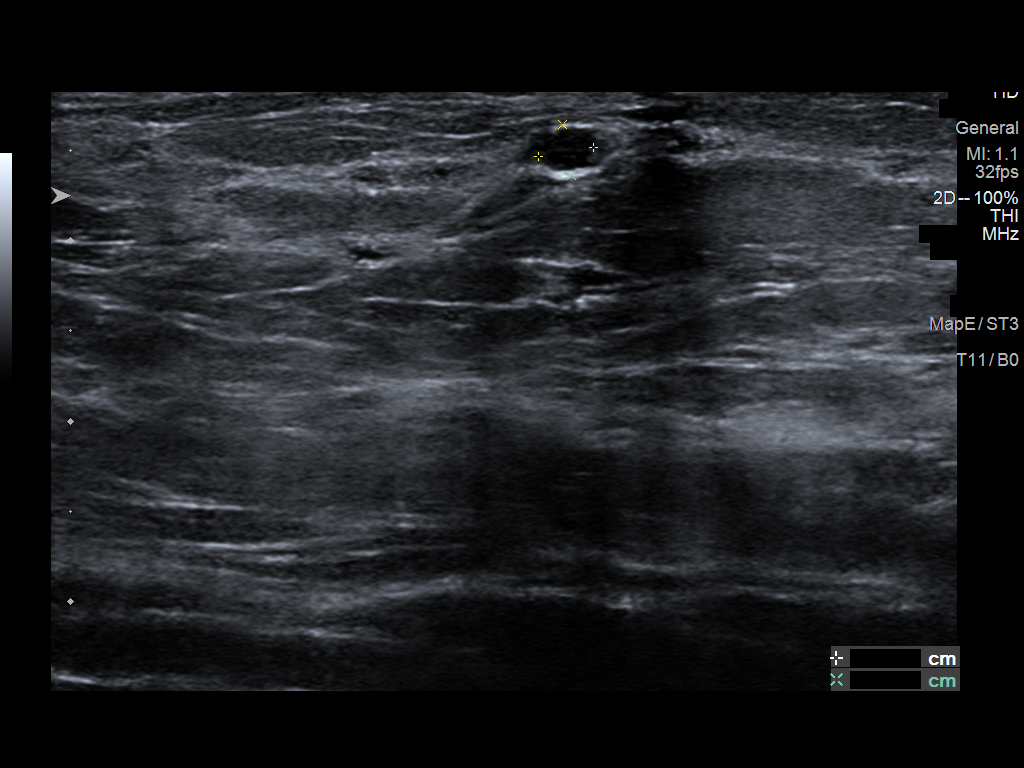

[5 of 5 positions shown; findings below may reference images not displayed]

ACR Breast Density Category b: There are scattered areas of
fibroglandular density.
FINDINGS: 2D/3D spot compression views of the LEFT breast demonstrate no
persistent suspicious abnormality in the area of the screening study
finding.

Targeted ultrasound is performed, showing no suspicious sonographic
abnormalities within the RETROAREOLAR LEFT breast. Incidental note
is made of a 0.3 cm benign cyst in this area.
IMPRESSION: 1. No persistent mammographic or suspicious sonographic abnormality
in the area of the LEFT breast screening study finding.
2. Incidental RETROAREOLAR LEFT breast cysts.

RECOMMENDATION:
Bilateral screening mammogram in 1 year.

I have discussed the findings and recommendations with the patient.
If applicable, a reminder letter will be sent to the patient
regarding the next appointment.

BI-RADS CATEGORY  2: Benign.

## 2022-01-15 DIAGNOSIS — M5416 Radiculopathy, lumbar region: Secondary | ICD-10-CM | POA: Diagnosis not present

## 2022-01-15 DIAGNOSIS — M545 Low back pain, unspecified: Secondary | ICD-10-CM | POA: Diagnosis not present

## 2022-01-29 DIAGNOSIS — M5416 Radiculopathy, lumbar region: Secondary | ICD-10-CM | POA: Diagnosis not present

## 2022-01-29 DIAGNOSIS — M545 Low back pain, unspecified: Secondary | ICD-10-CM | POA: Diagnosis not present

## 2022-02-11 DIAGNOSIS — M545 Low back pain, unspecified: Secondary | ICD-10-CM | POA: Diagnosis not present

## 2022-02-11 DIAGNOSIS — M5416 Radiculopathy, lumbar region: Secondary | ICD-10-CM | POA: Diagnosis not present

## 2022-02-27 ENCOUNTER — Encounter: Payer: Self-pay | Admitting: Pulmonary Disease

## 2022-02-27 ENCOUNTER — Ambulatory Visit (INDEPENDENT_AMBULATORY_CARE_PROVIDER_SITE_OTHER): Payer: Medicare Other | Admitting: Pulmonary Disease

## 2022-02-27 VITALS — BP 120/68 | HR 81 | Temp 98.6°F | Ht 64.0 in | Wt 208.8 lb

## 2022-02-27 DIAGNOSIS — R053 Chronic cough: Secondary | ICD-10-CM

## 2022-02-27 NOTE — Patient Instructions (Signed)
Nice to see you  No changes in medications, let me know if you need Advair refills  Return to clinic in 1 year or sooner as needed

## 2022-03-16 NOTE — Progress Notes (Signed)
$'@Patient'D$  ID: Caitlin Stevenson, female    DOB: 09-18-1950, 71 y.o.   MRN: [id number]  Chief Complaint  Patient presents with   Follow-up    Pt is here for follow up for asthma and chronic cough. Pt states the cough is gone and that her asthma is doing well. Pt is not using inhalers at the moment     Referring provider: Reynold Bowen, MD  HPI:   71 y.o. woman whom we are seeing in follow up  for evaluation of cough.    Doing well. Cough remains well controlled. Using airduo with improved cough. No increase in albuterol rescue inhaler use.   HPI at initial visit: Patient with normal state of health.  Grandson contracted upper respiratory illness early to mid 2023 January.  Whole family got similar crud.  Initially dry cough..  Lingered for a couple of weeks.  Most family was including original patient, grandchild, improved.  Barky, spasmodic cough continued.  Urgent care visit 09/26/2021.  Diagnosed with pneumonia based on chest x-ray.  Prescribed duo nebs as well as cefdinir and azithromycin.  No real improvement.  Had a course of prednisone on hand, 65432 1 pill taper.  Mild improvement in terms of severity.  Was able to sleep last night so somewhat better at night.  No other relieving or exacerbating factors.  She has taken Mucinex with minimal relief.  Dextromethorphan/promethazine cough syrup without relief.  She reports history of asthma.  Only when on nonselective beta-blocker.  When that was discontinued her asthma symptoms resolved.  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, former NP, lives in [place] / Pulmonary Flowsheets:   ACT:  Asthma Control Test ACT Total Score  02/27/2022  3:57 PM 25    MMRC:     No data to display           Epworth:      No data to display           Tests:   FENO:  Lab  Results  Component Value Date   NITRICOXIDE 21 12/31/2015    PFT:     No data to display           WALK:      No data to display           Imaging: Personally reviewed  Lab Results: Personally reviewed CBC    Component Value Date/Time   WBC 12.3 (H) 04/18/2019 0242   RBC 3.81 (L) 04/18/2019 0242   HGB 12.2 04/18/2019 0242   HCT 36.1 04/18/2019 0242   PLT 233 04/18/2019 0242   MCV 94.8 04/18/2019 0242   MCH 32.0 04/18/2019 0242   MCHC 33.8 04/18/2019 0242   RDW 12.3 04/18/2019 0242   LYMPHSABS 1.7 04/12/2019 1028   MONOABS 0.5 04/12/2019 1028   EOSABS 0.2 04/12/2019 1028   BASOSABS 0.1 04/12/2019 1028    BMET    Component Value Date/Time   NA 136 04/18/2019 0242   K 4.4 04/18/2019 0242   CL 105 04/18/2019 0242   CO2 24 04/18/2019 0242   GLUCOSE 133 (H) 04/18/2019 0242   BUN 16 04/18/2019 0242   CREATININE 0.67 04/18/2019 0242   CALCIUM 8.2 (L) 04/18/2019 0242   GFRNONAA >60 04/18/2019 0242   GFRAA >60 04/18/2019 0242    BNP No results found for: "BNP"  ProBNP No results found for: "PROBNP"  Specialty Problems       Pulmonary Problems   Cough variant asthma vs UACS     NO 10/15/2015  = 51 - Spirometry 10/15/2015 wnl including fef25-75 with last saba > 8 h prior - 12/11/2015  After extensive coaching HFA effectiveness =    75% > challenge with dulera 100 2bid > stopped late April 2017 p improved off tenormin - added flutter valve 1/69/6789 with cyclical cough regimen - NO 12/31/2015   = 18 off dulera > ok to leave off  - Rec trial of gabapentin 12/31/2015 > f/u Dr Joya Gaskins at [facility] prn        Allergies  Allergen Reactions   Atenolol     Esophageal Spasms   Minocycline Hcl     Migraines and Dizziness   Morphine Sulfate Nausea And Vomiting   Amoxicillin Rash    Did it involve swelling of the face/tongue/throat, SOB, or low BP? No Did it involve sudden or severe rash/hives, skin peeling, or any reaction on the inside of your mouth or nose?  No Did you need to seek medical attention at a hospital or doctor's office? No When did it last happen?    Childhood allergy   If all above answers are "NO", may proceed with cephalosporin use.    Penicillins Rash    Did it involve swelling of the face/tongue/throat, SOB, or low BP? No Did it involve sudden or severe rash/hives, skin peeling, or any reaction on the inside of your mouth or nose? No Did you need to seek medical attention at a hospital or doctor's office? No When did it last happen?    Childhood allergy   If all above answers are "NO", may proceed with cephalosporin use.    Immunization History  Administered Date(s) Administered   Influenza Split 06/17/2015   Influenza-Unspecified 05/31/2018   PPD Test 09/24/2014, 02/04/2016   Pneumococcal Conjugate-13 04/17/2016   Td 09/01/2003   Tdap 04/17/2016   Zoster, Live 10/16/2003    Past Medical History:  Diagnosis Date   Allergy    Arthritis    Chronic back pain    Depression    Diabetes mellitus without complication ([place])    diet controlled   History of carpal tunnel syndrome    Bilateral   Hypertension    Hypothyroidism    Migraine    Obese    Pneumonia    x2   Psoriasis    Sleep apnea    mouth guard    Tobacco History: Social History   Tobacco Use  Smoking Status Never  Smokeless Tobacco Never   Counseling given: Not Answered   Continue to not smoke  Outpatient Encounter Medications as of 02/27/2022  Medication Sig   estradiol (VIVELLE-DOT) 0.1 MG/24HR patch Place 1 patch onto the skin 2 (two) times a week.   Fluticasone-Salmeterol (AIRDUO RESPICLICK 381/01) 751-02 MCG/ACT AEPB Inhale 1 puff into the lungs 2 (two) times daily.   halobetasol (ULTRAVATE) 0.05 % ointment Apply topically 2 (two) times daily. (Patient taking differently: Apply 1 application  topically 2 (two) times daily as needed (itching).)   levothyroxine (SYNTHROID, LEVOTHROID) 137 MCG tablet Take 1 tablet (137 mcg total) by mouth  daily before breakfast. Need annual appointment with labs for further refills   methocarbamol (ROBAXIN) 500 MG tablet Take 1-2 tablets (500-1,000 mg total) by mouth every 6 (six) hours as needed for muscle spasms.   olmesartan-hydrochlorothiazide (BENICAR HCT) 20-12.5 MG tablet Take 1 tablet by mouth at bedtime.  progesterone (PROMETRIUM) 100 MG capsule Take 100 mg by mouth daily.   SUMAtriptan (IMITREX) 100 MG tablet Take 1 tablet (100 mg total) by mouth every 2 (two) hours as needed for migraine. May repeat in 2 hours if headache persists or recurs.   [DISCONTINUED] HYDROcodone bit-homatropine (HYDROMET) 5-1.5 MG/5ML syrup Take 5 mLs by mouth at bedtime as needed for cough.   [DISCONTINUED] LORazepam (ATIVAN) 1 MG tablet Take 1 mg by mouth 3 (three) times daily as needed for anxiety.   [DISCONTINUED] zolpidem (AMBIEN) 10 MG tablet Take 10 mg by mouth at bedtime as needed for sleep.    No facility-administered encounter medications on file as of 02/27/2022.     Review of Systems  Review of Systems  N/a Physical Exam  BP 120/68 (BP Location: Right Arm, Patient Position: Sitting, Cuff Size: Normal)   Pulse 81   Temp 98.6 F (37 C) (Oral)   Ht '5\' 4"'$  (1.626 m)   Wt 208 lb 12.8 oz (94.7 kg)   SpO2 96%   BMI 35.84 kg/m   Wt Readings from Last 5 Encounters:  02/27/22 208 lb 12.8 oz (94.7 kg)  11/25/21 208 lb (94.3 kg)  11/10/21 205 lb 3.2 oz (93.1 kg)  10/08/21 206 lb (93.4 kg)  04/17/19 208 lb (94.3 kg)    BMI Readings from Last 5 Encounters:  02/27/22 35.84 kg/m  11/25/21 34.61 kg/m  11/10/21 34.15 kg/m  10/08/21 34.28 kg/m  04/17/19 34.61 kg/m     Physical Exam General: Well-appearing, sitting in chair Eyes: EOMI, icterus Neck: Supple, no JVP Pulmonary: Normal work of breathing, no expiratory wheeze Cardiovascular: Regular in rhythm, no murmur Abdomen: Nondistended, bowel sounds present MSK: No synovitis, no joint effusion Neuro: Normal gait, no  weakness Psych: Normal mood, full affect   Assessment & Plan:   Cough: Productive, spasmodic.  Following clear description of viral infection.  Suspect postviral cough, reactive airways disease, asthma. Better with prednisone. Airduo working well to control cough.  Asthma: hall mark of symptoms is cough. Better controlled with Airduo.    Return in about 1 year (around 02/28/2023).    Lanier Clam, MD 03/16/2022

## 2022-03-31 DIAGNOSIS — E1169 Type 2 diabetes mellitus with other specified complication: Secondary | ICD-10-CM | POA: Diagnosis not present

## 2022-03-31 DIAGNOSIS — M5451 Vertebrogenic low back pain: Secondary | ICD-10-CM | POA: Diagnosis not present

## 2022-03-31 DIAGNOSIS — H40013 Open angle with borderline findings, low risk, bilateral: Secondary | ICD-10-CM | POA: Diagnosis not present

## 2022-03-31 DIAGNOSIS — E669 Obesity, unspecified: Secondary | ICD-10-CM | POA: Diagnosis not present

## 2022-03-31 DIAGNOSIS — M7061 Trochanteric bursitis, right hip: Secondary | ICD-10-CM | POA: Diagnosis not present

## 2022-03-31 DIAGNOSIS — M5136 Other intervertebral disc degeneration, lumbar region: Secondary | ICD-10-CM | POA: Diagnosis not present

## 2022-06-01 DIAGNOSIS — R7303 Prediabetes: Secondary | ICD-10-CM | POA: Diagnosis not present

## 2022-06-01 DIAGNOSIS — E669 Obesity, unspecified: Secondary | ICD-10-CM | POA: Diagnosis not present

## 2022-06-01 DIAGNOSIS — E559 Vitamin D deficiency, unspecified: Secondary | ICD-10-CM | POA: Diagnosis not present

## 2022-06-01 DIAGNOSIS — E039 Hypothyroidism, unspecified: Secondary | ICD-10-CM | POA: Diagnosis not present

## 2022-06-01 DIAGNOSIS — I10 Essential (primary) hypertension: Secondary | ICD-10-CM | POA: Diagnosis not present

## 2022-06-01 DIAGNOSIS — E785 Hyperlipidemia, unspecified: Secondary | ICD-10-CM | POA: Diagnosis not present

## 2022-06-02 ENCOUNTER — Telehealth: Payer: Self-pay | Admitting: Pulmonary Disease

## 2022-06-03 NOTE — Telephone Encounter (Signed)
ATC patient about requested refills.  LM to call back when available.

## 2022-06-04 DIAGNOSIS — M5416 Radiculopathy, lumbar region: Secondary | ICD-10-CM | POA: Diagnosis not present

## 2022-06-04 DIAGNOSIS — M4316 Spondylolisthesis, lumbar region: Secondary | ICD-10-CM | POA: Diagnosis not present

## 2022-06-04 MED ORDER — ALBUTEROL SULFATE HFA 108 (90 BASE) MCG/ACT IN AERS: 2.0000 | INHALATION_SPRAY | Freq: Four times a day (QID) | RESPIRATORY_TRACT | 11 refills | Status: DC | PRN

## 2022-06-04 MED ORDER — ALBUTEROL SULFATE (2.5 MG/3ML) 0.083% IN NEBU: 2.5000 mg | INHALATION_SOLUTION | Freq: Four times a day (QID) | RESPIRATORY_TRACT | 3 refills | Status: DC | PRN

## 2022-06-04 NOTE — Telephone Encounter (Signed)
Called and spoke to patient about medications being sent into pharmacy. Patient verbalized understanding. Nothing further needed

## 2022-06-04 NOTE — Telephone Encounter (Signed)
Albuterol inhaler and nebulizer solution refilled - thanks

## 2022-06-04 NOTE — Telephone Encounter (Signed)
Spoke with the pt  She states she was seen at [facility] a while back and they prescribed albuterol inhaler and albuterol neb sol  She is now needing a refill on these and is asking if Dr Silas Flood will call in  She is currently stable with her breathing but would like to have these on hand  Please advise, thanks!

## 2022-06-22 ENCOUNTER — Encounter: Payer: Self-pay | Admitting: Pulmonary Disease

## 2022-06-22 ENCOUNTER — Ambulatory Visit (INDEPENDENT_AMBULATORY_CARE_PROVIDER_SITE_OTHER): Payer: Medicare Other | Admitting: Pulmonary Disease

## 2022-06-22 VITALS — BP 130/78 | HR 67 | Temp 97.5°F | Ht 64.0 in | Wt 211.0 lb

## 2022-06-22 DIAGNOSIS — J4541 Moderate persistent asthma with (acute) exacerbation: Secondary | ICD-10-CM | POA: Diagnosis not present

## 2022-06-22 DIAGNOSIS — R053 Chronic cough: Secondary | ICD-10-CM | POA: Diagnosis not present

## 2022-06-22 MED ORDER — FLUTICASONE FUROATE-VILANTEROL 200-25 MCG/ACT IN AEPB: 1.0000 | INHALATION_SPRAY | Freq: Every day | RESPIRATORY_TRACT | 2 refills | Status: DC

## 2022-06-22 MED ORDER — PREDNISONE 10 MG PO TABS: ORAL_TABLET | ORAL | 0 refills | Status: DC

## 2022-06-22 NOTE — Patient Instructions (Addendum)
Thank you for visiting Dr. Valeta Harms at [facility]. Today we recommend the following:  Meds ordered this encounter  Medications   predniSONE (DELTASONE) 10 MG tablet    Sig: Take 4 tabs by mouth once daily x4 days, then 3 tabs x4 days, 2 tabs x4 days, 1 tab x4 days and stop.    Dispense:  40 tablet    Refill:  0   fluticasone furoate-vilanterol (BREO ELLIPTA) 200-25 MCG/ACT AEPB    Sig: Inhale 1 puff into the lungs daily.    Dispense:  1 each    Refill:  2   Return in about 4 weeks (around 07/20/2022) for with APP or Dr. Silas Flood .    Please do your part to reduce the spread of COVID-19.

## 2022-06-22 NOTE — Progress Notes (Signed)
$'@Patient'h$  ID: Caitlin Stevenson, female    DOB: 1951/01/13, 71 y.o.   MRN: [id number]  Chief Complaint  Patient presents with   Follow-up   Acute Visit    2weeks cough, SOB, wheezing    Referring provider: Reynold Bowen, MD  HPI:   71 y.o. woman whom we are seeing in follow up  for evaluation of cough.    Doing well. Cough remains well controlled. Using airduo with improved cough. No increase in albuterol rescue inhaler use.   HPI at initial visit: Patient with normal state of health.  Grandson contracted upper respiratory illness early to mid 2023 January.  Whole family got similar crud.  Initially dry cough..  Lingered for a couple of weeks.  Most family was including original patient, grandchild, improved.  Barky, spasmodic cough continued.  Urgent care visit 09/26/2021.  Diagnosed with pneumonia based on chest x-ray.  Prescribed duo nebs as well as cefdinir and azithromycin.  No real improvement.  Had a course of prednisone on hand, 65432 1 pill taper.  Mild improvement in terms of severity.  Was able to sleep last night so somewhat better at night.  No other relieving or exacerbating factors.  She has taken Mucinex with minimal relief.  Dextromethorphan/promethazine cough syrup without relief.  She reports history of asthma.  Only when on nonselective beta-blocker.  When that was discontinued her asthma symptoms resolved.  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 Silas Flood here today with increased shortness of breath chest tightness and wheezing.  Was diagnosed with asthma was given AirDuo.  She never got this inhaler.  She does use as needed albuterol.  Never been on a maintenance inhaler had increased shortness of breath of the past couple of weeks.  She has been using her  albuterol more she had chest tightness and wheezing.   Questionaires / Pulmonary Flowsheets:   ACT:  Asthma Control Test ACT Total Score  02/27/2022  3:57 PM 25    MMRC:     No data to display           Epworth:      No data to display           Tests:   FENO:  Lab Results  Component Value Date   NITRICOXIDE 21 12/31/2015    PFT:     No data to display           WALK:      No data to display           Imaging: Personally reviewed  Lab Results: Personally reviewed CBC    Component Value Date/Time   WBC 12.3 (H) 04/18/2019 0242   RBC 3.81 (L) 04/18/2019 0242   HGB 12.2 04/18/2019 0242   HCT 36.1 04/18/2019 0242   PLT 233 04/18/2019 0242   MCV 94.8 04/18/2019 0242   MCH 32.0 04/18/2019 0242   MCHC 33.8 04/18/2019 0242   RDW 12.3 04/18/2019 0242   LYMPHSABS 1.7 04/12/2019 1028   MONOABS 0.5 04/12/2019 1028   EOSABS 0.2 04/12/2019 1028   BASOSABS 0.1 04/12/2019 1028    BMET    Component Value Date/Time   NA 136 04/18/2019 0242   K 4.4 04/18/2019 0242   CL 105 04/18/2019 0242   CO2 24 04/18/2019 0242   GLUCOSE 133 (H) 04/18/2019 0242  BUN 16 04/18/2019 0242   CREATININE 0.67 04/18/2019 0242   CALCIUM 8.2 (L) 04/18/2019 0242   GFRNONAA >60 04/18/2019 0242   GFRAA >60 04/18/2019 0242    BNP No results found for: "BNP"  ProBNP No results found for: "PROBNP"  Specialty Problems       Pulmonary Problems   Cough variant asthma vs UACS     NO 10/15/2015  = 51 - Spirometry 10/15/2015 wnl including fef25-75 with last saba > 8 h prior - 12/11/2015  After extensive coaching HFA effectiveness =    75% > challenge with dulera 100 2bid > stopped late April 2017 p improved off tenormin - added flutter valve 9/56/2130 with cyclical cough regimen - NO 12/31/2015   = 18 off dulera > ok to leave off  - Rec trial of gabapentin 12/31/2015 > f/u Dr Joya Gaskins at [facility] prn        Allergies  Allergen Reactions   Atenolol     Esophageal Spasms    Minocycline Hcl     Migraines and Dizziness   Morphine Sulfate Nausea And Vomiting   Amoxicillin Rash    Did it involve swelling of the face/tongue/throat, SOB, or low BP? No Did it involve sudden or severe rash/hives, skin peeling, or any reaction on the inside of your mouth or nose? No Did you need to seek medical attention at a hospital or doctor's office? No When did it last happen?    Childhood allergy   If all above answers are "NO", may proceed with cephalosporin use.    Penicillins Rash    Did it involve swelling of the face/tongue/throat, SOB, or low BP? No Did it involve sudden or severe rash/hives, skin peeling, or any reaction on the inside of your mouth or nose? No Did you need to seek medical attention at a hospital or doctor's office? No When did it last happen?    Childhood allergy   If all above answers are "NO", may proceed with cephalosporin use.    Immunization History  Administered Date(s) Administered   Influenza Split 06/17/2015   Influenza-Unspecified 05/31/2018   PPD Test 09/24/2014, 02/04/2016   Pneumococcal Conjugate-13 04/17/2016   Td 09/01/2003   Tdap 04/17/2016   Zoster, Live 10/16/2003    Past Medical History:  Diagnosis Date   Allergy    Arthritis    Chronic back pain    Depression    Diabetes mellitus without complication ([place])    diet controlled   History of carpal tunnel syndrome    Bilateral   Hypertension    Hypothyroidism    Migraine    Obese    Pneumonia    x2   Psoriasis    Sleep apnea    mouth guard    Tobacco History: Social History   Tobacco Use  Smoking Status Never  Smokeless Tobacco Never   Counseling given: Not Answered   Continue to not smoke  Outpatient Encounter Medications as of 06/22/2022  Medication Sig   albuterol (PROVENTIL) (2.5 MG/3ML) 0.083% nebulizer solution Take 3 mLs (2.5 mg total) by nebulization every 6 (six) hours as needed for wheezing or shortness of breath.   albuterol (VENTOLIN  HFA) 108 (90 Base) MCG/ACT inhaler Inhale 2 puffs into the lungs every 6 (six) hours as needed for wheezing or shortness of breath.   estradiol (VIVELLE-DOT) 0.1 MG/24HR patch Place 1 patch onto the skin 2 (two) times a week.   Fluticasone-Salmeterol (AIRDUO RESPICLICK 865/78) 469-62 MCG/ACT AEPB Inhale 1  puff into the lungs 2 (two) times daily.   halobetasol (ULTRAVATE) 0.05 % ointment Apply topically 2 (two) times daily. (Patient taking differently: Apply 1 application  topically 2 (two) times daily as needed (itching).)   levothyroxine (SYNTHROID, LEVOTHROID) 137 MCG tablet Take 1 tablet (137 mcg total) by mouth daily before breakfast. Need annual appointment with labs for further refills   methocarbamol (ROBAXIN) 500 MG tablet Take 1-2 tablets (500-1,000 mg total) by mouth every 6 (six) hours as needed for muscle spasms.   olmesartan-hydrochlorothiazide (BENICAR HCT) 20-12.5 MG tablet Take 1 tablet by mouth at bedtime.   progesterone (PROMETRIUM) 100 MG capsule Take 100 mg by mouth daily.   SUMAtriptan (IMITREX) 100 MG tablet Take 1 tablet (100 mg total) by mouth every 2 (two) hours as needed for migraine. May repeat in 2 hours if headache persists or recurs.   No facility-administered encounter medications on file as of 06/22/2022.     Review of Systems  Review of Systems  Constitutional:  Negative for chills and fever.  HENT:  Negative for hearing loss, sore throat and tinnitus.   Respiratory:  Positive for cough, shortness of breath and wheezing. Negative for stridor.   Cardiovascular:  Negative for chest pain, palpitations and leg swelling.  Gastrointestinal:  Negative for abdominal pain, constipation, diarrhea, nausea and vomiting.  Genitourinary:  Negative for dysuria, hematuria and urgency.  Musculoskeletal:  Negative for myalgias.  Skin:  Negative for rash.  Allergic/Immunologic: Negative for environmental allergies.  Neurological:  Negative for dizziness, weakness and  headaches.  Hematological:  Does not bruise/bleed easily.  Psychiatric/Behavioral:  The patient is not nervous/anxious.   All other systems reviewed and are negative.   N/a Physical Exam  BP 130/78 (BP Location: Left Arm, Cuff Size: Normal)   Pulse 67   Temp (!) 97.5 F (36.4 C) (Oral)   Ht '5\' 4"'$  (1.626 m)   Wt 211 lb (95.7 kg)   SpO2 98%   BMI 36.22 kg/m   Wt Readings from Last 5 Encounters:  06/22/22 211 lb (95.7 kg)  02/27/22 208 lb 12.8 oz (94.7 kg)  11/25/21 208 lb (94.3 kg)  11/10/21 205 lb 3.2 oz (93.1 kg)  10/08/21 206 lb (93.4 kg)    BMI Readings from Last 5 Encounters:  06/22/22 36.22 kg/m  02/27/22 35.84 kg/m  11/25/21 34.61 kg/m  11/10/21 34.15 kg/m  10/08/21 34.28 kg/m    Physical Exam Constitutional:      Appearance: She is obese.  HENT:     Head: Normocephalic.     Nose: Congestion present.  Eyes:     Conjunctiva/sclera: Conjunctivae normal.     Pupils: Pupils are equal, round, and reactive to light.  Cardiovascular:     Rate and Rhythm: Normal rate and regular rhythm.     Heart sounds: No murmur heard.    No gallop.  Pulmonary:     Effort: Pulmonary effort is normal.     Breath sounds: No stridor. Wheezing present. No rhonchi or rales.  Chest:     Chest wall: No tenderness.  Abdominal:     General: There is no distension.     Tenderness: There is no abdominal tenderness.  Musculoskeletal:     Cervical back: Normal range of motion and neck supple.     Right lower leg: No edema.     Left lower leg: No edema.  Skin:    General: Skin is warm and dry.  Neurological:     General: No focal  deficit present.  Psychiatric:        Mood and Affect: Mood normal.        Behavior: Behavior normal.       Assessment & Plan:    Acute exacerbation asthma Chest tightness, cough, wheezing  Plan: Prednisone taper Breo 200 inhaler Continue albuterol as needed, nebulized albuterol as needed   Garner Nash, DO 06/22/2022

## 2022-07-16 DIAGNOSIS — N959 Unspecified menopausal and perimenopausal disorder: Secondary | ICD-10-CM | POA: Diagnosis not present

## 2022-07-16 DIAGNOSIS — Z1231 Encounter for screening mammogram for malignant neoplasm of breast: Secondary | ICD-10-CM | POA: Diagnosis not present

## 2022-07-16 DIAGNOSIS — Z1272 Encounter for screening for malignant neoplasm of vagina: Secondary | ICD-10-CM | POA: Diagnosis not present

## 2022-07-16 DIAGNOSIS — Z124 Encounter for screening for malignant neoplasm of cervix: Secondary | ICD-10-CM | POA: Diagnosis not present

## 2022-07-16 DIAGNOSIS — Z6835 Body mass index (BMI) 35.0-35.9, adult: Secondary | ICD-10-CM | POA: Diagnosis not present

## 2022-07-20 ENCOUNTER — Ambulatory Visit (INDEPENDENT_AMBULATORY_CARE_PROVIDER_SITE_OTHER): Payer: Medicare Other | Admitting: Internal Medicine

## 2022-07-20 ENCOUNTER — Other Ambulatory Visit: Payer: Self-pay | Admitting: Internal Medicine

## 2022-07-20 ENCOUNTER — Encounter: Payer: Self-pay | Admitting: Internal Medicine

## 2022-07-20 VITALS — BP 128/72 | HR 67 | Temp 98.6°F | Ht 64.5 in | Wt 208.8 lb

## 2022-07-20 DIAGNOSIS — J069 Acute upper respiratory infection, unspecified: Secondary | ICD-10-CM | POA: Diagnosis not present

## 2022-07-20 MED ORDER — PREDNISONE 10 MG PO TABS: ORAL_TABLET | ORAL | 0 refills | Status: DC

## 2022-07-20 MED ORDER — HYDROCODONE BIT-HOMATROP MBR 5-1.5 MG/5ML PO SOLN: 5.0000 mL | Freq: Four times a day (QID) | ORAL | 0 refills | Status: DC | PRN

## 2022-07-20 NOTE — Progress Notes (Signed)
Caitlin Stevenson    [id number]    28-Mar-1951  Primary Care Physician:South, Annie Main, MD Date of Appointment: 07/20/2022 Established Patient Visit  Chief complaint:   Chief Complaint  Patient presents with   Acute Visit    Rsv ? Having a cold for 8 days. Got worse Saturday      HPI: Caitlin Stevenson is a 71 y.o. woman with history of asthma who sees Dr. Silas Flood.   Interval Updates: Here for acute visit. 8 days of cough and URI symptoms. Symptoms acutely worsened over the weekend.Then grandson 10 months tested positive for RSV. Took negative covid test yesterday.   Symptoms of persistent cough, chest congestion, shortness of breath.   Takes breo and albuterol mdi at home. Does have nebulizer machine but hasn't started it yet. Having some wheezing. Difficulty with deep breathing.   Taking OTC mucinex. Staying up all night coughing.   I have reviewed the patient's family social and past medical history and updated as appropriate.   Past Medical History:  Diagnosis Date   Allergy    Arthritis    Chronic back pain    Depression    Diabetes mellitus without complication ([facility])    diet controlled   History of carpal tunnel syndrome    Bilateral   Hypertension    Hypothyroidism    Migraine    Obese    Pneumonia    x2   Psoriasis    Sleep apnea    mouth guard    Past Surgical History:  Procedure Laterality Date   ABDOMINAL HYSTERECTOMY     CARPAL TUNNEL RELEASE     CERVICAL FUSION     COLONOSCOPY     FRACTURE SURGERY     tibia fracture   TOTAL KNEE ARTHROPLASTY Left 04/17/2019   Procedure: TOTAL KNEE ARTHROPLASTY;  Surgeon: Vickey Huger, MD;  Location: [facility];  Service: Orthopedics;  Laterality: Left;   UPPER GI ENDOSCOPY      Family History  Problem Relation Age of Onset   Hypertension Mother     Social History   Occupational History   Not on file  Tobacco Use   Smoking status: Never   Smokeless tobacco: Never  Vaping Use   Vaping Use:  Never used  Substance and Sexual Activity   Alcohol use: No    Alcohol/week: 0.0 standard drinks of alcohol   Drug use: No   Sexual activity: Not on file     Physical Exam: Blood pressure 128/72, pulse 67, temperature 98.6 F (37 C), temperature source Oral, height 5' 4.5" (1.638 m), weight 208 lb 12.8 oz (94.7 kg), SpO2 97 %.  Gen:      No acute distress, frequent coughing ENT:  no nasal polyps, mucus membranes moist Lungs:    tachypnic, frequent coughing, rhonchi that clear with coughing, shallow inspiration CV:         Regular rate and rhythm; no murmurs, rubs, or gallops.  No pedal edema   Data Reviewed: Imaging: I have personally reviewed the   PFTs:      No data to display         I have personally reviewed the patient's PFTs and spirometry 2017 shows no airflow limitation.   Labs:  Immunization status: Immunization History  Administered Date(s) Administered   Influenza Split 06/17/2015   Influenza,inj,Quad PF,6+ Mos 06/09/2018   Influenza-Unspecified 05/31/2018   PPD Test 09/24/2014, 02/04/2016   Pneumococcal Conjugate-13 04/17/2016  Td 09/01/2003   Tdap 04/17/2016   Zoster, Live 10/16/2003    External Records Personally Reviewed: pulmonary  Assessment:  Acute viral URI - suspected RSV  Plan/Recommendations:  Tested for flu, covid, RSV - symptoms over 5 days out so no indication for any anti-virals.   You have an acute viral upper respiratory tract infection.  With your asthma and shortness of breath we will do a taper of prednisone. I have sent hycodan cough syrup to your pharmacy to help with nighttime sleep especially.  Start taking your albuterol nebulizer 4 times/daily until the symptoms start to clear.  Continue Breo.  Hope you feel better soon! Will let you know the results of your viral testing which should be back later this week. In the mean time would continue to wear a mask and presume you are contagious for the next few days.    Return to Care: Return in about 3 months (around 10/20/2022).   Lenice Llamas, MD Pulmonary and [place]

## 2022-07-20 NOTE — Addendum Note (Signed)
Addended by: Loma Sousa on: 07/20/2022 11:16 AM   Modules accepted: Orders

## 2022-07-20 NOTE — Addendum Note (Signed)
Addended by: Alvin Critchley on: 07/20/2022 02:15 PM   Modules accepted: Orders

## 2022-07-20 NOTE — Addendum Note (Signed)
Addended by: Loma Sousa on: 07/20/2022 11:17 AM   Modules accepted: Orders

## 2022-07-20 NOTE — Patient Instructions (Addendum)
Follow up with Dr. Silas Flood sooner than one year (would make an appointment in the next 3 months) since you've had multiple flares and probably need to adjust your maintenance therapy.   You have an acute viral upper respiratory tract infection.  With your asthma and shortness of breath we will do a taper of prednisone. I have sent hycodan cough syrup to your pharmacy to help with nighttime sleep especially.  Start taking your albuterol nebulizer 4 times/daily until the symptoms start to clear.  Continue Breo.  Hope you feel better soon! Will let you know the results of your viral testing which should be back later this week. In the mean time would continue to wear a mask and presume you are contagious for the next few days.

## 2022-07-21 ENCOUNTER — Other Ambulatory Visit: Payer: Self-pay | Admitting: Obstetrics and Gynecology

## 2022-07-21 DIAGNOSIS — R928 Other abnormal and inconclusive findings on diagnostic imaging of breast: Secondary | ICD-10-CM

## 2022-07-22 LAB — COVID-19, FLU A+B AND RSV
Influenza A, NAA: NOT DETECTED
Influenza B, NAA: NOT DETECTED
RSV, NAA: NOT DETECTED
SARS-CoV-2, NAA: NOT DETECTED

## 2022-07-24 ENCOUNTER — Telehealth: Payer: Self-pay | Admitting: Pulmonary Disease

## 2022-07-24 NOTE — Telephone Encounter (Signed)
Attempted to call pt but line went directly to VM. Left message for her to return call. °

## 2022-07-24 NOTE — Telephone Encounter (Signed)
Spoke with pt who states last night she had a very hard time breathing and did not sleep any last night r/t SOB and cough. Pt is currently on Z-Pak, Prednisone taper and using both nebulizer and Hycodan. Pt denies any fever/ chills/ GI upset. Pt states breathing is fine during the day but get worse at night. Pt instructed that if symptoms get worse she will need to go UC or ED to be evaluated. Dr. Verlee Monte please advise.

## 2022-07-24 NOTE — Telephone Encounter (Signed)
Attempted to call, left voicemail, sent my chart message

## 2022-07-25 ENCOUNTER — Encounter (HOSPITAL_COMMUNITY): Payer: Self-pay

## 2022-07-25 ENCOUNTER — Encounter (HOSPITAL_BASED_OUTPATIENT_CLINIC_OR_DEPARTMENT_OTHER): Payer: Self-pay | Admitting: Emergency Medicine

## 2022-07-25 ENCOUNTER — Other Ambulatory Visit: Payer: Self-pay

## 2022-07-25 ENCOUNTER — Inpatient Hospital Stay (HOSPITAL_BASED_OUTPATIENT_CLINIC_OR_DEPARTMENT_OTHER)
Admission: EM | Admit: 2022-07-25 | Discharge: 2022-07-31 | DRG: 189 | Disposition: A | Discharge status: Home Health | Payer: Medicare Other | Attending: Internal Medicine | Admitting: Internal Medicine

## 2022-07-25 ENCOUNTER — Emergency Department (HOSPITAL_BASED_OUTPATIENT_CLINIC_OR_DEPARTMENT_OTHER): Payer: Medicare Other

## 2022-07-25 DIAGNOSIS — R0602 Shortness of breath: Secondary | ICD-10-CM | POA: Diagnosis not present

## 2022-07-25 DIAGNOSIS — Z880 Allergy status to penicillin: Secondary | ICD-10-CM

## 2022-07-25 DIAGNOSIS — Z888 Allergy status to other drugs, medicaments and biological substances status: Secondary | ICD-10-CM

## 2022-07-25 DIAGNOSIS — Z90710 Acquired absence of both cervix and uterus: Secondary | ICD-10-CM

## 2022-07-25 DIAGNOSIS — J189 Pneumonia, unspecified organism: Principal | ICD-10-CM

## 2022-07-25 DIAGNOSIS — Z96652 Presence of left artificial knee joint: Secondary | ICD-10-CM | POA: Diagnosis present

## 2022-07-25 DIAGNOSIS — J45991 Cough variant asthma: Secondary | ICD-10-CM | POA: Diagnosis present

## 2022-07-25 DIAGNOSIS — G43909 Migraine, unspecified, not intractable, without status migrainosus: Secondary | ICD-10-CM | POA: Diagnosis not present

## 2022-07-25 DIAGNOSIS — T17998A Other foreign object in respiratory tract, part unspecified causing other injury, initial encounter: Secondary | ICD-10-CM | POA: Insufficient documentation

## 2022-07-25 DIAGNOSIS — Z20822 Contact with and (suspected) exposure to covid-19: Secondary | ICD-10-CM | POA: Diagnosis present

## 2022-07-25 DIAGNOSIS — G4733 Obstructive sleep apnea (adult) (pediatric): Secondary | ICD-10-CM | POA: Diagnosis not present

## 2022-07-25 DIAGNOSIS — I10 Essential (primary) hypertension: Secondary | ICD-10-CM | POA: Diagnosis not present

## 2022-07-25 DIAGNOSIS — E669 Obesity, unspecified: Secondary | ICD-10-CM | POA: Diagnosis present

## 2022-07-25 DIAGNOSIS — M549 Dorsalgia, unspecified: Secondary | ICD-10-CM | POA: Diagnosis present

## 2022-07-25 DIAGNOSIS — F419 Anxiety disorder, unspecified: Secondary | ICD-10-CM | POA: Diagnosis present

## 2022-07-25 DIAGNOSIS — J90 Pleural effusion, not elsewhere classified: Secondary | ICD-10-CM | POA: Diagnosis present

## 2022-07-25 DIAGNOSIS — E871 Hypo-osmolality and hyponatremia: Secondary | ICD-10-CM | POA: Diagnosis present

## 2022-07-25 DIAGNOSIS — D649 Anemia, unspecified: Secondary | ICD-10-CM | POA: Diagnosis not present

## 2022-07-25 DIAGNOSIS — J168 Pneumonia due to other specified infectious organisms: Secondary | ICD-10-CM | POA: Diagnosis not present

## 2022-07-25 DIAGNOSIS — E1169 Type 2 diabetes mellitus with other specified complication: Secondary | ICD-10-CM | POA: Diagnosis present

## 2022-07-25 DIAGNOSIS — Z20828 Contact with and (suspected) exposure to other viral communicable diseases: Secondary | ICD-10-CM | POA: Diagnosis present

## 2022-07-25 DIAGNOSIS — G8929 Other chronic pain: Secondary | ICD-10-CM | POA: Diagnosis not present

## 2022-07-25 DIAGNOSIS — G4730 Sleep apnea, unspecified: Secondary | ICD-10-CM | POA: Diagnosis present

## 2022-07-25 DIAGNOSIS — Z7951 Long term (current) use of inhaled steroids: Secondary | ICD-10-CM

## 2022-07-25 DIAGNOSIS — E039 Hypothyroidism, unspecified: Secondary | ICD-10-CM | POA: Diagnosis present

## 2022-07-25 DIAGNOSIS — R0902 Hypoxemia: Secondary | ICD-10-CM | POA: Diagnosis not present

## 2022-07-25 DIAGNOSIS — Z79890 Hormone replacement therapy (postmenopausal): Secondary | ICD-10-CM | POA: Diagnosis not present

## 2022-07-25 DIAGNOSIS — Z79899 Other long term (current) drug therapy: Secondary | ICD-10-CM

## 2022-07-25 DIAGNOSIS — F32A Depression, unspecified: Secondary | ICD-10-CM | POA: Diagnosis present

## 2022-07-25 DIAGNOSIS — J9819 Other pulmonary collapse: Secondary | ICD-10-CM | POA: Diagnosis not present

## 2022-07-25 DIAGNOSIS — Z885 Allergy status to narcotic agent status: Secondary | ICD-10-CM

## 2022-07-25 DIAGNOSIS — J9690 Respiratory failure, unspecified, unspecified whether with hypoxia or hypercapnia: Secondary | ICD-10-CM | POA: Diagnosis not present

## 2022-07-25 DIAGNOSIS — J9809 Other diseases of bronchus, not elsewhere classified: Secondary | ICD-10-CM | POA: Diagnosis present

## 2022-07-25 DIAGNOSIS — E119 Type 2 diabetes mellitus without complications: Secondary | ICD-10-CM | POA: Diagnosis not present

## 2022-07-25 DIAGNOSIS — T17500A Unspecified foreign body in bronchus causing asphyxiation, initial encounter: Secondary | ICD-10-CM | POA: Diagnosis not present

## 2022-07-25 DIAGNOSIS — D72829 Elevated white blood cell count, unspecified: Secondary | ICD-10-CM | POA: Insufficient documentation

## 2022-07-25 DIAGNOSIS — J181 Lobar pneumonia, unspecified organism: Secondary | ICD-10-CM

## 2022-07-25 DIAGNOSIS — J9601 Acute respiratory failure with hypoxia: Secondary | ICD-10-CM | POA: Diagnosis not present

## 2022-07-25 DIAGNOSIS — I700 Atherosclerosis of aorta: Secondary | ICD-10-CM | POA: Diagnosis not present

## 2022-07-25 DIAGNOSIS — E785 Hyperlipidemia, unspecified: Secondary | ICD-10-CM | POA: Diagnosis present

## 2022-07-25 DIAGNOSIS — Z6836 Body mass index (BMI) 36.0-36.9, adult: Secondary | ICD-10-CM

## 2022-07-25 DIAGNOSIS — J45901 Unspecified asthma with (acute) exacerbation: Secondary | ICD-10-CM | POA: Diagnosis present

## 2022-07-25 DIAGNOSIS — J9811 Atelectasis: Secondary | ICD-10-CM | POA: Diagnosis not present

## 2022-07-25 DIAGNOSIS — Z8249 Family history of ischemic heart disease and other diseases of the circulatory system: Secondary | ICD-10-CM

## 2022-07-25 DIAGNOSIS — Z981 Arthrodesis status: Secondary | ICD-10-CM

## 2022-07-25 LAB — CBC WITH DIFFERENTIAL/PLATELET
Abs Immature Granulocytes: 0.08 10*3/uL — ABNORMAL HIGH (ref 0.00–0.07)
Basophils Absolute: 0 10*3/uL (ref 0.0–0.1)
Basophils Relative: 0 %
Eosinophils Absolute: 0.1 10*3/uL (ref 0.0–0.5)
Eosinophils Relative: 1 %
HCT: 37.3 % (ref 36.0–46.0)
Hemoglobin: 12.9 g/dL (ref 12.0–15.0)
Immature Granulocytes: 1 %
Lymphocytes Relative: 8 %
Lymphs Abs: 1.2 10*3/uL (ref 0.7–4.0)
MCH: 31.3 pg (ref 26.0–34.0)
MCHC: 34.6 g/dL (ref 30.0–36.0)
MCV: 90.5 fL (ref 80.0–100.0)
Monocytes Absolute: 0.5 10*3/uL (ref 0.1–1.0)
Monocytes Relative: 4 %
Neutro Abs: 11.8 10*3/uL — ABNORMAL HIGH (ref 1.7–7.7)
Neutrophils Relative %: 86 %
Platelets: 316 10*3/uL (ref 150–400)
RBC: 4.12 MIL/uL (ref 3.87–5.11)
RDW: 12.8 % (ref 11.5–15.5)
WBC: 13.6 10*3/uL — ABNORMAL HIGH (ref 4.0–10.5)
nRBC: 0 % (ref 0.0–0.2)

## 2022-07-25 LAB — BASIC METABOLIC PANEL
Anion gap: 9 (ref 5–15)
BUN: 19 mg/dL (ref 8–23)
CO2: 25 mmol/L (ref 22–32)
Calcium: 8.7 mg/dL — ABNORMAL LOW (ref 8.9–10.3)
Chloride: 100 mmol/L (ref 98–111)
Creatinine, Ser: 0.77 mg/dL (ref 0.44–1.00)
GFR, Estimated: >60 mL/min (ref >60)
Glucose, Bld: 104 mg/dL — ABNORMAL HIGH (ref 70–99)
Potassium: 3.6 mmol/L (ref 3.5–5.1)
Sodium: 134 mmol/L — ABNORMAL LOW (ref 135–145)

## 2022-07-25 LAB — RESP PANEL BY RT-PCR (FLU A&B, COVID) ARPGX2
Influenza A by PCR: NEGATIVE
Influenza B by PCR: NEGATIVE
SARS Coronavirus 2 by RT PCR: NEGATIVE

## 2022-07-25 LAB — TROPONIN I (HIGH SENSITIVITY)
Troponin I (High Sensitivity): 7 ng/L (ref <18)
Troponin I (High Sensitivity): 9 ng/L (ref <18)

## 2022-07-25 LAB — MRSA NEXT GEN BY PCR, NASAL: MRSA by PCR Next Gen: NOT DETECTED

## 2022-07-25 LAB — GLUCOSE, CAPILLARY: Glucose-Capillary: 176 mg/dL — ABNORMAL HIGH (ref 70–99)

## 2022-07-25 LAB — BRAIN NATRIURETIC PEPTIDE: B Natriuretic Peptide: 144.9 pg/mL — ABNORMAL HIGH (ref 0.0–100.0)

## 2022-07-25 MED ORDER — CHLORHEXIDINE GLUCONATE CLOTH 2 % EX PADS
6.0000 | MEDICATED_PAD | Freq: Every day | CUTANEOUS | Status: DC
  Administered 2022-07-25 – 2022-07-29 (×6): 6 via TOPICAL

## 2022-07-25 MED ORDER — ONDANSETRON HCL 4 MG PO TABS: 4.0000 mg | ORAL_TABLET | Freq: Four times a day (QID) | ORAL | Status: DC | PRN

## 2022-07-25 MED ORDER — IRBESARTAN 150 MG PO TABS
150.0000 mg | ORAL_TABLET | Freq: Every day | ORAL | Status: DC
  Administered 2022-07-26 – 2022-07-31 (×6): 150 mg via ORAL
  Filled 2022-07-25 (×6): qty 1

## 2022-07-25 MED ORDER — GUAIFENESIN-DM 100-10 MG/5ML PO SYRP
15.0000 mL | ORAL_SOLUTION | ORAL | Status: DC | PRN
  Administered 2022-07-26: 15 mL via ORAL
  Filled 2022-07-25: qty 20

## 2022-07-25 MED ORDER — OLMESARTAN MEDOXOMIL-HCTZ 20-12.5 MG PO TABS: 1.0000 | ORAL_TABLET | Freq: Every day | ORAL | Status: DC

## 2022-07-25 MED ORDER — ALBUTEROL SULFATE (2.5 MG/3ML) 0.083% IN NEBU
2.5000 mg | INHALATION_SOLUTION | RESPIRATORY_TRACT | Status: DC | PRN
  Administered 2022-07-26 – 2022-07-27 (×3): 2.5 mg via RESPIRATORY_TRACT
  Filled 2022-07-25 (×4): qty 3

## 2022-07-25 MED ORDER — SODIUM CHLORIDE 0.9 % IV SOLN
1.0000 g | Freq: Once | INTRAVENOUS | Status: AC
  Administered 2022-07-25: 1 g via INTRAVENOUS
  Filled 2022-07-25: qty 10

## 2022-07-25 MED ORDER — HYDROCHLOROTHIAZIDE 12.5 MG PO TABS
12.5000 mg | ORAL_TABLET | Freq: Every day | ORAL | Status: DC
  Administered 2022-07-26 – 2022-07-31 (×6): 12.5 mg via ORAL
  Filled 2022-07-25 (×6): qty 1

## 2022-07-25 MED ORDER — IPRATROPIUM-ALBUTEROL 0.5-2.5 (3) MG/3ML IN SOLN
3.0000 mL | Freq: Four times a day (QID) | RESPIRATORY_TRACT | Status: DC
  Administered 2022-07-26 – 2022-07-31 (×22): 3 mL via RESPIRATORY_TRACT
  Filled 2022-07-25 (×24): qty 3

## 2022-07-25 MED ORDER — INSULIN ASPART 100 UNIT/ML IJ SOLN
0.0000 [IU] | Freq: Three times a day (TID) | INTRAMUSCULAR | Status: DC
  Administered 2022-07-26 – 2022-07-27 (×2): 2 [IU] via SUBCUTANEOUS
  Administered 2022-07-28: 3 [IU] via SUBCUTANEOUS
  Administered 2022-07-29 – 2022-07-31 (×2): 2 [IU] via SUBCUTANEOUS

## 2022-07-25 MED ORDER — AZITHROMYCIN 250 MG PO TABS: 500.0000 mg | ORAL_TABLET | Freq: Every day | ORAL | Status: DC

## 2022-07-25 MED ORDER — LEVOTHYROXINE SODIUM 25 MCG PO TABS
137.0000 ug | ORAL_TABLET | Freq: Every day | ORAL | Status: DC
  Administered 2022-07-27 – 2022-07-31 (×5): 137 ug via ORAL
  Filled 2022-07-25 (×5): qty 1

## 2022-07-25 MED ORDER — INSULIN ASPART 100 UNIT/ML IJ SOLN: 0.0000 [IU] | Freq: Every day | INTRAMUSCULAR | Status: DC

## 2022-07-25 MED ORDER — CITALOPRAM HYDROBROMIDE 20 MG PO TABS
40.0000 mg | ORAL_TABLET | Freq: Every day | ORAL | Status: DC
  Administered 2022-07-25 – 2022-07-30 (×6): 40 mg via ORAL
  Filled 2022-07-25 (×6): qty 2

## 2022-07-25 MED ORDER — HYDROXYZINE HCL 25 MG PO TABS
25.0000 mg | ORAL_TABLET | Freq: Every day | ORAL | Status: DC
  Administered 2022-07-25 – 2022-07-30 (×6): 25 mg via ORAL
  Filled 2022-07-25 (×6): qty 1

## 2022-07-25 MED ORDER — IPRATROPIUM-ALBUTEROL 0.5-2.5 (3) MG/3ML IN SOLN
3.0000 mL | Freq: Once | RESPIRATORY_TRACT | Status: AC
  Administered 2022-07-25: 3 mL via RESPIRATORY_TRACT
  Filled 2022-07-25: qty 3

## 2022-07-25 MED ORDER — FLUTICASONE FUROATE-VILANTEROL 200-25 MCG/ACT IN AEPB
1.0000 | INHALATION_SPRAY | Freq: Every day | RESPIRATORY_TRACT | Status: DC
  Administered 2022-07-26: 1 via RESPIRATORY_TRACT
  Filled 2022-07-25: qty 28

## 2022-07-25 MED ORDER — ALBUTEROL SULFATE (2.5 MG/3ML) 0.083% IN NEBU
2.5000 mg | INHALATION_SOLUTION | Freq: Once | RESPIRATORY_TRACT | Status: AC
  Administered 2022-07-25: 2.5 mg via RESPIRATORY_TRACT
  Filled 2022-07-25: qty 3

## 2022-07-25 MED ORDER — ORAL CARE MOUTH RINSE: 15.0000 mL | OROMUCOSAL | Status: DC | PRN

## 2022-07-25 MED ORDER — PREDNISONE 20 MG PO TABS
30.0000 mg | ORAL_TABLET | Freq: Once | ORAL | Status: AC
  Administered 2022-07-25: 30 mg via ORAL
  Filled 2022-07-25: qty 1

## 2022-07-25 MED ORDER — ACETAMINOPHEN 325 MG PO TABS
650.0000 mg | ORAL_TABLET | Freq: Four times a day (QID) | ORAL | Status: DC | PRN
  Administered 2022-07-28 – 2022-07-30 (×3): 650 mg via ORAL
  Filled 2022-07-25 (×3): qty 2

## 2022-07-25 MED ORDER — SODIUM CHLORIDE 0.9 % IV SOLN
500.0000 mg | Freq: Once | INTRAVENOUS | Status: AC
  Administered 2022-07-25: 500 mg via INTRAVENOUS
  Filled 2022-07-25: qty 5

## 2022-07-25 MED ORDER — IOHEXOL 300 MG/ML  SOLN
75.0000 mL | Freq: Once | INTRAMUSCULAR | Status: AC | PRN
  Administered 2022-07-25: 75 mL via INTRAVENOUS

## 2022-07-25 MED ORDER — ONDANSETRON HCL 4 MG/2ML IJ SOLN: 4.0000 mg | Freq: Four times a day (QID) | INTRAMUSCULAR | Status: DC | PRN

## 2022-07-25 MED ORDER — ACETAMINOPHEN 650 MG RE SUPP: 650.0000 mg | Freq: Four times a day (QID) | RECTAL | Status: DC | PRN

## 2022-07-25 MED ORDER — SODIUM CHLORIDE 0.9 % IV SOLN: 1.0000 g | INTRAVENOUS | Status: DC

## 2022-07-25 MED ORDER — ALBUTEROL SULFATE HFA 108 (90 BASE) MCG/ACT IN AERS: 2.0000 | INHALATION_SPRAY | RESPIRATORY_TRACT | Status: DC | PRN

## 2022-07-25 NOTE — Subjective & Objective (Signed)
Chief complaint: Shortness of breath History present illness: 71 year old female history of cough variant asthma, diabetes type 2, hyperlipidemia, hypertension, obesity BMI 36.14 presents the ER today with worsening shortness of breath.  Patient states that her grandson was hospitalized last week for RSV.  She was around her grandson.  She was seen by pulmonology last week due to shortness of breath and a cough.  She was placed on prednisone.  She had a Z-Pak at home that was leftover from another infection that she took.  She has not improved at all.  She has been having a continuous cough for the last week.  No fevers.  Has been easing her albuterol inhaler and utilize her machine without any relief in her cough and shortness of breath.  She presented to the ER today with room air saturations of 85%.  She had 1 episode of diarrhea today.  She has not had any fever or chills.  No vomiting.  Arrival to the ER, temp 98.9 heart rate 77 blood pressure 158/88.  Again saturations on room air 85%.  She was started on nasal cannula at 3 L and quickly titrated up to 8 L high flow.  Labs showed a white count of 13.6, hemoglobin of 12.9, platelets of 316  Sodium 134, BUN of 19, creatinine 0.77  BNP slightly elevated 144  COVID negative, influenza A & B negative.  CT chest with IV contrast showed near complete atelectasis of the right upper lobe with multifocal areas of mucous plugging in the right upper lobe bronchus.  There is no obstructing mass.  There is also mucous plug in the left lower bronchus with near collapse of the left lower lobe.  Postobstructive pneumonia cannot be ruled out.  [facility] hospitalist contacted for admission.  Pulmonology was contacted by the a triage hospitalist.  Patient transferred to [facility] to stepdown unit.

## 2022-07-25 NOTE — ED Provider Notes (Signed)
[facility] EMERGENCY DEPARTMENT Provider Note   CSN: [id number] Arrival date & time: 07/25/22  1429     History  Chief Complaint  Patient presents with   Asthma    Caitlin Stevenson is a 71 y.o. female.  Patient is a 71 year old female with a past medical history of hypertension, diabetes and asthma presenting to the emergency department with cough and shortness of breath.  The patient states that she has been feeling sick for the last 2 weeks with cough and shortness of breath.  She was seen by her pulmonologist last Wednesday and was started on a Z-Pak and steroid taper.  She states that she has been using her albuterol regularly at home but has only been declining and getting worse.  She denies any fevers or chills or chest pain.  She denies any nausea, vomiting.  She states she had 1 episode of diarrhea yesterday.  She denies any lower extremity swelling.  She denies any history of blood clots, recent hospitalizations or surgeries, recent travels in the car or plane.  She states that her grandson has been sick with RSV recently however she tested negative for RSV.  The history is provided by the patient and a relative.  Asthma       Home Medications Prior to Admission medications   Medication Sig Start Date End Date Taking? Authorizing Provider  albuterol (PROVENTIL) (2.5 MG/3ML) 0.083% nebulizer solution Take 3 mLs (2.5 mg total) by nebulization every 6 (six) hours as needed for wheezing or shortness of breath. 06/04/22   Hunsucker, Bonna Gains, MD  albuterol (VENTOLIN HFA) 108 (90 Base) MCG/ACT inhaler Inhale 2 puffs into the lungs every 6 (six) hours as needed for wheezing or shortness of breath. 06/04/22   Hunsucker, Bonna Gains, MD  estradiol (VIVELLE-DOT) 0.1 MG/24HR patch Place 1 patch onto the skin 2 (two) times a week.    [provider]  fluticasone furoate-vilanterol (BREO ELLIPTA) 200-25 MCG/ACT AEPB Inhale 1 puff into the lungs daily. 06/22/22   Icard,  Octavio Graves, DO  Fluticasone-Salmeterol (AIRDUO RESPICLICK 409/81) 191-47 MCG/ACT AEPB Inhale 1 puff into the lungs 2 (two) times daily. 11/25/21   Hunsucker, Bonna Gains, MD  halobetasol (ULTRAVATE) 0.05 % ointment Apply topically 2 (two) times daily. Patient taking differently: Apply 1 application  topically 2 (two) times daily as needed (itching). 10/17/15   Hoyt Koch, MD  HYDROcodone bit-homatropine (HYCODAN) 5-1.5 MG/5ML syrup Take 5 mLs by mouth every 6 (six) hours as needed for cough. 07/20/22   Spero Geralds, MD  levothyroxine (SYNTHROID, LEVOTHROID) 137 MCG tablet Take 1 tablet (137 mcg total) by mouth daily before breakfast. Need annual appointment with labs for further refills 06/06/18   Hoyt Koch, MD  methocarbamol (ROBAXIN) 500 MG tablet Take 1-2 tablets (500-1,000 mg total) by mouth every 6 (six) hours as needed for muscle spasms. 04/18/19   Donia Ast, PA  olmesartan-hydrochlorothiazide (BENICAR HCT) 20-12.5 MG tablet Take 1 tablet by mouth at bedtime. 03/23/19   [provider]  predniSONE (DELTASONE) 10 MG tablet Take 4 tabs by mouth once daily x4 days, then 3 tabs x4 days, 2 tabs x4 days, 1 tab x4 days and stop. 07/20/22   Spero Geralds, MD  progesterone (PROMETRIUM) 100 MG capsule Take 100 mg by mouth daily.    [provider]  SUMAtriptan (IMITREX) 100 MG tablet Take 1 tablet (100 mg total) by mouth every 2 (two) hours as needed for migraine. May repeat  in 2 hours if headache persists or recurs. 10/05/16   Hoyt Koch, MD      Allergies    Atenolol, Minocycline hcl, Morphine sulfate, Amoxicillin, and Penicillins    Review of Systems   Review of Systems  Physical Exam Updated Vital Signs BP 134/66 (BP Location: Right Arm)   Pulse 88   Temp 98.9 F (37.2 C)   Resp 20   Ht 5' 4.5" (1.638 m)   Wt 94.7 kg   SpO2 93%   BMI 35.29 kg/m  Physical Exam Vitals and nursing note reviewed.  Constitutional:      General: She  is not in acute distress.    Appearance: She is ill-appearing.  HENT:     Head: Normocephalic and atraumatic.     Nose: Nose normal.     Mouth/Throat:     Mouth: Mucous membranes are moist.     Pharynx: Oropharynx is clear.  Eyes:     Extraocular Movements: Extraocular movements intact.     Conjunctiva/sclera: Conjunctivae normal.  Cardiovascular:     Rate and Rhythm: Normal rate and regular rhythm.     Pulses: Normal pulses.     Heart sounds: Normal heart sounds.  Pulmonary:     Effort: No respiratory distress.     Breath sounds: Wheezing (expiratory, diffuse) and rhonchi (R upper lung fields) present.     Comments: Mildly tachypneic Abdominal:     General: Abdomen is flat.     Palpations: Abdomen is soft.     Tenderness: There is no abdominal tenderness.  Musculoskeletal:     Cervical back: Normal range of motion and neck supple.     Right lower leg: No edema.     Left lower leg: No edema.  Skin:    General: Skin is warm and dry.  Neurological:     General: No focal deficit present.     Mental Status: She is alert and oriented to person, place, and time.  Psychiatric:        Mood and Affect: Mood normal.        Behavior: Behavior normal.     ED Results / Procedures / Treatments   Labs (all labs ordered are listed, but only abnormal results are displayed) Labs Reviewed  CBC WITH DIFFERENTIAL/PLATELET - Abnormal; Notable for the following components:      Result Value   WBC 13.6 (*)    Neutro Abs 11.8 (*)    Abs Immature Granulocytes 0.08 (*)    All other components within normal limits  BASIC METABOLIC PANEL - Abnormal; Notable for the following components:   Sodium 134 (*)    Glucose, Bld 104 (*)    Calcium 8.7 (*)    All other components within normal limits  BRAIN NATRIURETIC PEPTIDE - Abnormal; Notable for the following components:   B Natriuretic Peptide 144.9 (*)    All other components within normal limits  RESP PANEL BY RT-PCR (FLU A&B, COVID) [id number]   TROPONIN I (HIGH SENSITIVITY)  TROPONIN I (HIGH SENSITIVITY)    EKG EKG Interpretation  Date/Time:  Saturday July 25 2022 14:55:46 EST Ventricular Rate:  78 PR Interval:  158 QRS Duration: 99 QT Interval:  358 QTC Calculation: 408 R Axis:   54 Text Interpretation: Sinus rhythm Low voltage, extremity and precordial leads Probable anteroseptal infarct, old No significant change since last tracing Confirmed by Leanord Asal ([id number]) on 07/25/2022 3:02:00 PM  Radiology CT Chest W Contrast  Result Date: 07/25/2022 CLINICAL DATA:  Right upper lobe collapse seen on radiographs earlier today; evaluate for obstructing mass EXAM: CT CHEST WITH CONTRAST TECHNIQUE: Multidetector CT imaging of the chest was performed during intravenous contrast administration. RADIATION DOSE REDUCTION: This exam was performed according to the departmental dose-optimization program which includes automated exposure control, adjustment of the mA and/or kV according to patient size and/or use of iterative reconstruction technique. CONTRAST:  63m OMNIPAQUE IOHEXOL 300 MG/ML  SOLN COMPARISON:  Radiographs earlier today FINDINGS: Cardiovascular: Normal heart size. No pericardial effusion. Aortic atherosclerotic calcification. Mediastinum/Nodes: Normal thyroid. No thoracic adenopathy by size. Unremarkable esophagus. Lungs/Pleura: Near-complete atelectasis of the right upper lobe. Multifocal areas of mucous plugging in the right upper lobe bronchi. No definite obstructing mass. Mucous plugging in the left lower lobe bronchus with near-complete collapse of the left lower lobe. Small amount of layering secretions in the lower trachea. No pleural effusion or pneumothorax. Upper Abdomen: No acute abnormality. Musculoskeletal: No acute abnormality. Cervical spine fusion hardware. IMPRESSION: Near-complete collapse of the right upper and left lower lobes. This is favored due to mucus plug/secretions. Postobstructive pneumonia is  difficult to exclude. No definite obstructing mass though follow-up after treatment in 4-6 weeks is recommended. Aortic Atherosclerosis (ICD10-I70.0). Electronically Signed   By: TPlacido SouM.D.   On: 07/25/2022 16:21   DG Chest Portable 1 View  Result Date: 07/25/2022 CLINICAL DATA:  Shortness of breath. EXAM: PORTABLE CHEST 1 VIEW COMPARISON:  12/11/2015 FINDINGS: Complete right upper lobe collapse. There is some atelectasis at the left base. No edema or pleural effusion. The cardiopericardial silhouette is within normal limits for size. The visualized bony structures of the thorax are unremarkable. IMPRESSION: Complete right upper lobe collapse. Consider dedicated CT chest with intravenous contrast material to assess for central obstructing mass lesion. Electronically Signed   By: EMisty StanleyM.D.   On: 07/25/2022 15:06    Procedures Procedures    Medications Ordered in ED Medications  albuterol (VENTOLIN HFA) 108 (90 Base) MCG/ACT inhaler 2 puff (has no administration in time range)  cefTRIAXone (ROCEPHIN) 1 g in sodium chloride 0.9 % 100 mL IVPB (1 g Intravenous New Bag/Given 07/25/22 1712)  azithromycin (ZITHROMAX) 500 mg in sodium chloride 0.9 % 250 mL IVPB (has no administration in time range)  albuterol (PROVENTIL) (2.5 MG/3ML) 0.083% nebulizer solution 2.5 mg (has no administration in time range)  albuterol (PROVENTIL) (2.5 MG/3ML) 0.083% nebulizer solution 2.5 mg (has no administration in time range)  ipratropium-albuterol (DUONEB) 0.5-2.5 (3) MG/3ML nebulizer solution 3 mL (3 mLs Nebulization Given 07/25/22 1455)  ipratropium-albuterol (DUONEB) 0.5-2.5 (3) MG/3ML nebulizer solution 3 mL (3 mLs Nebulization Given 07/25/22 1533)  ipratropium-albuterol (DUONEB) 0.5-2.5 (3) MG/3ML nebulizer solution 3 mL (3 mLs Nebulization Given 07/25/22 1523)  predniSONE (DELTASONE) tablet 30 mg (30 mg Oral Given 07/25/22 1533)  iohexol (OMNIPAQUE) 300 MG/ML solution 75 mL (75 mLs Intravenous  Contrast Given 07/25/22 1609)    ED Course/ Medical Decision Making/ A&P Clinical Course as of 07/25/22 1719  Sat Jul 25, 2022  1525 Chest x-ray with right upper lobe collapse and CT chest will be performed to evaluate for underlying mass or infectious etiology. [VK]  [id number], the patient reports improvement of her breathing.  She continues to have bilateral inspiratory and expiratory wheeze but is moving better air and is no longer in respiratory distress.  She is on 8 L high flow nasal cannula.  Chest CT shows evidence of mucous plugging.  She will be started on IV antibiotics and will require  admission for her hypoxia in the setting of pneumonia with asthma exacerbation. [VK]    Clinical Course User Index [VK] Kemper Durie, DO                           Medical Decision Making This patient presents to the ED with chief complaint(s) of cough, shortness of breath with pertinent past medical history of asthma, hypertension, diabetes which further complicates the presenting complaint. The complaint involves an extensive differential diagnosis and also carries with it a high risk of complications and morbidity.    The differential diagnosis includes pneumonia, pneumothorax, pulmonary edema, pleural effusion, considering possible PE though less likely with recent viral symptoms, ACS, anemia, viral syndrome  Additional history obtained: Additional history obtained from family Records reviewed outpatient pulmonology records  ED Course and Reassessment: On patient's arrival to the emergency department she was hypoxic to 84% on room air and was placed on 6 L nasal cannula with improvement to 92%.  She is mildly tachypneic but otherwise in no acute respiratory distress.  She does have bilateral wheezes with rhonchi concerning for asthma exacerbation with possible underlying pneumonia.  She was started on DuoNebs.  She took 30 mg of prednisone this morning and will be given additional  30 mg.  She will have x-ray and labs performed and will be closely reassessed.  Independent labs interpretation:  The following labs were independently interpreted: mild leukocytosis otherwise within normal range  Independent visualization of imaging: - I independently visualized the following imaging with scope of interpretation limited to determining acute life threatening conditions related to emergency care: CXR, CT chest, which revealed RUL collapse with mucus plugging  Consultation: - Consulted or discussed management/test interpretation w/ external professional: Hospitalist  Consideration for admission or further workup: Requires admission for further management of her asthma exacerbation with pneumonia and hypoxia Social Determinants of health: N/A    Amount and/or Complexity of Data Reviewed Radiology: ordered.  Risk Prescription drug management. Decision regarding hospitalization.          Final Clinical Impression(s) / ED Diagnoses Final diagnoses:  Pneumonia of right upper lobe due to infectious organism  Mucus plug in respiratory tract  Hypoxia  Exacerbation of asthma, unspecified asthma severity, unspecified whether persistent    Rx / DC Orders ED Discharge Orders     None         Kemper Durie, DO 07/25/22 1719

## 2022-07-25 NOTE — Assessment & Plan Note (Signed)
Stable.  Continue Synthroid 137 mcg daily.

## 2022-07-25 NOTE — Assessment & Plan Note (Addendum)
Chronic. BMI 36.14

## 2022-07-25 NOTE — ED Notes (Signed)
RT remains at bedside to evaluate

## 2022-07-25 NOTE — Assessment & Plan Note (Addendum)
-   A1c 5.8% - Continue SSI and CBG monitoring

## 2022-07-25 NOTE — Assessment & Plan Note (Signed)
Stable

## 2022-07-25 NOTE — Assessment & Plan Note (Addendum)
-   CXT and CT chest consistent with what appears to be mucous plugging in setting of URI - responded to CPT as evidenced by improvement in CXR  - pulmonology consulted for further assistance also - continue current treatment; pulmonology has added further treatment on as well - PCT negative; WBC considered due to steroid use; afebrile. At this time d/c abx and monitor clinically  - now planning for bronch per pulmonology; follow up findings - follow up CT and autoimmune panel

## 2022-07-25 NOTE — ED Notes (Signed)
Patient transported to CT with RT

## 2022-07-25 NOTE — Progress Notes (Signed)
eLink Physician-Brief Progress Note Patient Name: Caitlin Stevenson DOB: Jan 05, 1951 MRN: [id number]   Date of Service  07/25/2022  HPI/Events of Note  71/F with history of asthma who presents with shortness of breath. PT noted to be hypoxemic, with wheezing on exam. Subsequent CXR shows RUL atelectasis.   eICU Interventions  - Continue supplemental O2, titrate to target SpO2 >90% - Continue bronchodilators, systemic steroids - Started on empiric antibiotics. Follow cultures and deescalate as warranted.  - Chest PT to RUL  - Serial CXR        Fardeen Steinberger M DELA CRUZ 07/25/2022, 9:31 PM

## 2022-07-25 NOTE — Assessment & Plan Note (Signed)
Admit to SDU. Pt does not use home O2. Continue with supplemental O2. Likely due to consolidation of RUL and LLL.

## 2022-07-25 NOTE — H&P (Signed)
History and Physical    RANITA STJULIEN IRW:[id number] DOB: 13-Aug-1951 DOA: 07/25/2022  DOS: the patient was seen and examined on 07/25/2022  PCP: Reynold Bowen, MD   Patient coming from: Home  I have personally briefly reviewed patient's old medical records in [place]  Chief complaint: Shortness of breath History present illness: 71 year old female history of cough variant asthma, diabetes type 2, hyperlipidemia, hypertension, obesity BMI 36.14 presents the ER today with worsening shortness of breath.  Patient states that her grandson was hospitalized last week for RSV.  She was around her grandson.  She was seen by pulmonology last week due to shortness of breath and a cough.  She was placed on prednisone.  She had a Z-Pak at home that was leftover from another infection that she took.  She has not improved at all.  She has been having a continuous cough for the last week.  No fevers.  Has been easing her albuterol inhaler and utilize her machine without any relief in her cough and shortness of breath.  She presented to the ER today with room air saturations of 85%.  She had 1 episode of diarrhea today.  She has not had any fever or chills.  No vomiting.  Arrival to the ER, temp 98.9 heart rate 77 blood pressure 158/88.  Again saturations on room air 85%.  She was started on nasal cannula at 3 L and quickly titrated up to 8 L high flow.  Labs showed a white count of 13.6, hemoglobin of 12.9, platelets of 316  Sodium 134, BUN of 19, creatinine 0.77  BNP slightly elevated 144  COVID negative, influenza A & B negative.  CT chest with IV contrast showed near complete atelectasis of the right upper lobe with multifocal areas of mucous plugging in the right upper lobe bronchus.  There is no obstructing mass.  There is also mucous plug in the left lower bronchus with near collapse of the left lower lobe.  Postobstructive pneumonia cannot be ruled out.  [facility] hospitalist  contacted for admission.  Pulmonology was contacted by the a triage hospitalist.  Patient transferred to [facility] to stepdown unit.     ED Course: CT chest shows collapse of RUL and LLL associated with mucus plugging  Review of Systems:  Review of Systems  Constitutional:  Negative for chills and fever.  HENT:  Negative for hearing loss.   Eyes: Negative.   Respiratory:  Positive for cough and shortness of breath. Negative for sputum production.   Cardiovascular: Negative.   Gastrointestinal: Negative.   Genitourinary: Negative.   Musculoskeletal: Negative.   Skin: Negative.   Neurological: Negative.   Endo/Heme/Allergies: Negative.   Psychiatric/Behavioral: Negative.    All other systems reviewed and are negative.   Past Medical History:  Diagnosis Date   Allergy    Arthritis    Chronic back pain    Depression    Diabetes mellitus without complication (HCC)    diet controlled   History of carpal tunnel syndrome    Bilateral   Hypertension    Hypothyroidism    Migraine    Obese    Pneumonia    x2   Psoriasis    Sleep apnea    mouth guard    Past Surgical History:  Procedure Laterality Date   ABDOMINAL HYSTERECTOMY     CARPAL TUNNEL RELEASE     CERVICAL FUSION     COLONOSCOPY     FRACTURE SURGERY  tibia fracture   TOTAL KNEE ARTHROPLASTY Left 04/17/2019   Procedure: TOTAL KNEE ARTHROPLASTY;  Surgeon: Vickey Huger, MD;  Location: [facility];  Service: Orthopedics;  Laterality: Left;   UPPER GI ENDOSCOPY       reports that she has never smoked. She has never used smokeless tobacco. She reports that she does not drink alcohol and does not use drugs.  Allergies  Allergen Reactions   Atenolol     Esophageal Spasms   Minocycline Hcl     Migraines and Dizziness   Morphine Sulfate Nausea And Vomiting   Amoxicillin Rash    Did it involve swelling of the face/tongue/throat, SOB, or low BP? No Did it involve sudden or severe rash/hives, skin  peeling, or any reaction on the inside of your mouth or nose? No Did you need to seek medical attention at a hospital or doctor's office? No When did it last happen?    Childhood allergy   If all above answers are "NO", may proceed with cephalosporin use.    Penicillins Rash    Did it involve swelling of the face/tongue/throat, SOB, or low BP? No Did it involve sudden or severe rash/hives, skin peeling, or any reaction on the inside of your mouth or nose? No Did you need to seek medical attention at a hospital or doctor's office? No When did it last happen?    Childhood allergy   If all above answers are "NO", may proceed with cephalosporin use.    Family History  Problem Relation Age of Onset   Hypertension Mother     Prior to Admission medications   Medication Sig Start Date End Date Taking? Authorizing Provider  albuterol (PROVENTIL) (2.5 MG/3ML) 0.083% nebulizer solution Take 3 mLs (2.5 mg total) by nebulization every 6 (six) hours as needed for wheezing or shortness of breath. 06/04/22  Yes Hunsucker, Bonna Gains, MD  albuterol (VENTOLIN HFA) 108 (90 Base) MCG/ACT inhaler Inhale 2 puffs into the lungs every 6 (six) hours as needed for wheezing or shortness of breath. 06/04/22  Yes Hunsucker, Bonna Gains, MD  citalopram (CELEXA) 40 MG tablet Take 40 mg by mouth at bedtime.   Yes [provider]  estradiol (VIVELLE-DOT) 0.1 MG/24HR patch Place 1 patch onto the skin 2 (two) times a week.   Yes [provider]  fluticasone furoate-vilanterol (BREO ELLIPTA) 200-25 MCG/ACT AEPB Inhale 1 puff into the lungs daily. 06/22/22  Yes Icard, Octavio Graves, DO  hydrOXYzine (ATARAX) 25 MG tablet Take 25 mg by mouth at bedtime.   Yes [provider]  levothyroxine (SYNTHROID, LEVOTHROID) 137 MCG tablet Take 1 tablet (137 mcg total) by mouth daily before breakfast. Need annual appointment with labs for further refills Patient taking differently: Take 137 mcg by mouth as directed. Take  1 tablet (137.5 mg) daily Mon-Fri. On Saturday Take 1/2 tablet (68.5 mg) & Hold on Sunday. Need annual appointment with labs for further refills 06/06/18  Yes Hoyt Koch, MD  predniSONE (DELTASONE) 10 MG tablet Take 4 tabs by mouth once daily x4 days, then 3 tabs x4 days, 2 tabs x4 days, 1 tab x4 days and stop. 07/20/22  Yes Spero Geralds, MD  progesterone (PROMETRIUM) 100 MG capsule 1 PO QHS 07/16/22  Yes [provider]  zolpidem (AMBIEN) 10 MG tablet Take 1 tablet by mouth at bedtime. 02/20/21  Yes [provider]  Fluticasone-Salmeterol (AIRDUO RESPICLICK 161/09) 604-54 MCG/ACT AEPB Inhale 1 puff into the lungs 2 (two) times daily. 11/25/21  Hunsucker, Bonna Gains, MD  halobetasol (ULTRAVATE) 0.05 % ointment Apply topically 2 (two) times daily. Patient taking differently: Apply 1 application  topically 2 (two) times daily as needed (itching). 10/17/15   Hoyt Koch, MD  HYDROcodone bit-homatropine (HYCODAN) 5-1.5 MG/5ML syrup Take 5 mLs by mouth every 6 (six) hours as needed for cough. 07/20/22   Spero Geralds, MD  LORazepam (ATIVAN) 1 MG tablet Take 1 tablet by mouth 3 (three) times daily as needed.    [provider]  methocarbamol (ROBAXIN) 500 MG tablet Take 1-2 tablets (500-1,000 mg total) by mouth every 6 (six) hours as needed for muscle spasms. 04/18/19   Donia Ast, PA  olmesartan-hydrochlorothiazide (BENICAR HCT) 20-12.5 MG tablet Take 1 tablet by mouth at bedtime. 03/23/19   [provider]  progesterone (PROMETRIUM) 100 MG capsule Take 100 mg by mouth daily.    [provider]  SUMAtriptan (IMITREX) 100 MG tablet Take 1 tablet (100 mg total) by mouth every 2 (two) hours as needed for migraine. May repeat in 2 hours if headache persists or recurs. 10/05/16   Hoyt Koch, MD    Physical Exam: Vitals:   07/25/22 1615 07/25/22 1645 07/25/22 1926 07/25/22 2100  BP: (!) 146/70 134/66 (!) 144/71   Pulse: 98 88  80   Resp: '20 20 19   '$ Temp:   98.3 F (36.8 C) 98.8 F (37.1 C)  TempSrc:   Oral Oral  SpO2: 92% 93% 94%   Weight:    95.5 kg  Height:    '5\' 4"'$  (1.626 m)    Physical Exam Vitals and nursing note reviewed.  Constitutional:      General: She is not in acute distress.    Appearance: She is obese. She is not ill-appearing, toxic-appearing or diaphoretic.  HENT:     Head: Normocephalic and atraumatic.     Nose: Nose normal.  Cardiovascular:     Rate and Rhythm: Normal rate and regular rhythm.     Pulses: Normal pulses.  Pulmonary:     Breath sounds: Examination of the right-upper field reveals decreased breath sounds. Examination of the left-lower field reveals decreased breath sounds. Decreased breath sounds and rhonchi present.  Abdominal:     General: Bowel sounds are normal. There is no distension.     Palpations: Abdomen is soft.     Tenderness: There is no abdominal tenderness. There is no guarding.  Musculoskeletal:     Right lower leg: No edema.     Left lower leg: No edema.  Skin:    General: Skin is warm and dry.     Capillary Refill: Capillary refill takes less than 2 seconds.  Neurological:     General: No focal deficit present.     Mental Status: She is alert and oriented to person, place, and time.      Labs on Admission: I have personally reviewed following labs and imaging studies  CBC: Recent Labs  Lab 07/25/22 1458  WBC 13.6*  NEUTROABS 11.8*  HGB 12.9  HCT 37.3  MCV 90.5  PLT 094   Basic Metabolic Panel: Recent Labs  Lab 07/25/22 1458  NA 134*  K 3.6  CL 100  CO2 25  GLUCOSE 104*  BUN 19  CREATININE 0.77  CALCIUM 8.7*   GFR: Estimated Creatinine Clearance: 71.2 mL/min (by C-G formula based on SCr of 0.77 mg/dL). Liver Function Tests: No results for input(s): "AST", "ALT", "ALKPHOS", "BILITOT", "PROT", "ALBUMIN" in the last 168  hours. No results for input(s): "LIPASE", "AMYLASE" in the last 168 hours. No results for input(s):  "AMMONIA" in the last 168 hours. Coagulation Profile: No results for input(s): "INR", "PROTIME" in the last 168 hours. Cardiac Enzymes: Recent Labs  Lab 07/25/22 1458 07/25/22 1707  TROPONINIHS 7 9   BNP (last 3 results) No results for input(s): "PROBNP" in the last 8760 hours. HbA1C: No results for input(s): "HGBA1C" in the last 72 hours. CBG: No results for input(s): "GLUCAP" in the last 168 hours. Lipid Profile: No results for input(s): "CHOL", "HDL", "LDLCALC", "TRIG", "CHOLHDL", "LDLDIRECT" in the last 72 hours. Thyroid Function Tests: No results for input(s): "TSH", "T4TOTAL", "FREET4", "T3FREE", "THYROIDAB" in the last 72 hours. Anemia Panel: No results for input(s): "VITAMINB12", "FOLATE", "FERRITIN", "TIBC", "IRON", "RETICCTPCT" in the last 72 hours. Urine analysis: No results found for: "COLORURINE", "APPEARANCEUR", "LABSPEC", "PHURINE", "GLUCOSEU", "HGBUR", "BILIRUBINUR", "KETONESUR", "PROTEINUR", "UROBILINOGEN", "NITRITE", "LEUKOCYTESUR"  Radiological Exams on Admission: I have personally reviewed images CT Chest W Contrast  Result Date: 07/25/2022 CLINICAL DATA:  Right upper lobe collapse seen on radiographs earlier today; evaluate for obstructing mass EXAM: CT CHEST WITH CONTRAST TECHNIQUE: Multidetector CT imaging of the chest was performed during intravenous contrast administration. RADIATION DOSE REDUCTION: This exam was performed according to the departmental dose-optimization program which includes automated exposure control, adjustment of the mA and/or kV according to patient size and/or use of iterative reconstruction technique. CONTRAST:  22m OMNIPAQUE IOHEXOL 300 MG/ML  SOLN COMPARISON:  Radiographs earlier today FINDINGS: Cardiovascular: Normal heart size. No pericardial effusion. Aortic atherosclerotic calcification. Mediastinum/Nodes: Normal thyroid. No thoracic adenopathy by size. Unremarkable esophagus. Lungs/Pleura: Near-complete atelectasis of the right  upper lobe. Multifocal areas of mucous plugging in the right upper lobe bronchi. No definite obstructing mass. Mucous plugging in the left lower lobe bronchus with near-complete collapse of the left lower lobe. Small amount of layering secretions in the lower trachea. No pleural effusion or pneumothorax. Upper Abdomen: No acute abnormality. Musculoskeletal: No acute abnormality. Cervical spine fusion hardware. IMPRESSION: Near-complete collapse of the right upper and left lower lobes. This is favored due to mucus plug/secretions. Postobstructive pneumonia is difficult to exclude. No definite obstructing mass though follow-up after treatment in 4-6 weeks is recommended. Aortic Atherosclerosis (ICD10-I70.0). Electronically Signed   By: TPlacido SouM.D.   On: 07/25/2022 16:21   DG Chest Portable 1 View  Result Date: 07/25/2022 CLINICAL DATA:  Shortness of breath. EXAM: PORTABLE CHEST 1 VIEW COMPARISON:  12/11/2015 FINDINGS: Complete right upper lobe collapse. There is some atelectasis at the left base. No edema or pleural effusion. The cardiopericardial silhouette is within normal limits for size. The visualized bony structures of the thorax are unremarkable. IMPRESSION: Complete right upper lobe collapse. Consider dedicated CT chest with intravenous contrast material to assess for central obstructing mass lesion. Electronically Signed   By: EMisty StanleyM.D.   On: 07/25/2022 15:06    EKG: My personal interpretation of EKG shows: NSR    Assessment/Plan Principal Problem:   Acute hypoxemic respiratory failure (HCC) Active Problems:   Consolidation of right upper lobe of lung (HCC)   Diabetes mellitus type 2 in obese ([place]   Hyperlipidemia   Essential hypertension   Morbid obesity due to excess calories ([place]   Hypothyroidism    Assessment and Plan: * Acute hypoxemic respiratory failure ([place] Admit to SDU. Pt does not use home O2. Continue with supplemental O2. Likely due to consolidation of  RUL and LLL.  Consolidation of right upper lobe  of lung ([place]) Will try chest vest physiotherapy to help with expectoration of sputum. Repeat CXR in AM.  N.p.o. after midnight.  If chest physiotherapy does not work, will need pulmonology consult for bronchoscopy.  Patient aware of this. Empiric abx for CAP although pt does not have fever, chills or leukocytosis. She is not wheezing. Will not continue steroids as pt states they have not helped at all this week.  Morbid obesity due to excess calories (HCC) Chronic.  BMI 36.14  Essential hypertension Stable.  Continue Benicar HCT  Hyperlipidemia Stable.  Diabetes mellitus type 2 in obese (HCC) Check A1c.  Add sliding scale insulin.  Patient states that she does not take any diabetic medications at home.  Hypothyroidism Stable.  Continue Synthroid 137 mcg daily.   DVT prophylaxis: SCDs Code Status: Full Code Family Communication: no family at bedside  Disposition Plan: return home  Consults called: [facility]age hospitalist discussed case with PCCM who approved admission to [facility] SDU  Admission status: Inpatient, Step Down Unit   Kristopher Oppenheim, DO [facility] 07/25/2022, 9:58 PM

## 2022-07-25 NOTE — ED Triage Notes (Signed)
Patient c/o shortness of breath and cough for 2 weeks. Pulse ox 85% on room air. Pt has history of pneumonia.

## 2022-07-25 NOTE — Plan of Care (Signed)
Plan of Care Note for accepted transfer   Patient: Caitlin Stevenson MRN: [id number]   DOA: 07/25/2022  Facility requesting transfer: [facility] Requesting Provider: Dr. Maylon Peppers Reason for transfer: respiratory distress Facility course:  Caitlin Stevenson is a 71  yo female with PMH asthma (follows with pulmonology), HTN, hypothyroidism, depression, psoriasis, sleep apnea. Patient presented to [facility] with worsening shortness of breath and cough.  She was found to be in the 80s on room air on presentation. She was recently seen outpatient by pulmonology on 07/20/2022 and underwent viral testing which was negative for COVID, flu, RSV.  She did have reported exposure to a grandchild with RSV however testing for the patient was negative on 11/20.   She has been on a prednisone taper along with hycodan and albuterol for symptom management at home.  CXR obtained showed concern for right upper lobe collapse.  She then underwent CT chest which showed "near complete collapse of the right upper and left lower lobes".  Concern was for underlying mucous plugging.  Repeat Covid and Flu testing at [facility] again negative.  Case briefly discussed with pulmonology. Patient okay for admission to [facility] and further treatment of underlying mucous plugging and weaning of O2 as able. May possibly require bronch.  Patient recommended to have chest PT ordered on arrival to [facility] as well as follow up CXR in am.   Patient stable on salter HF 8L with SpO2 93% on last vitals. Otherwise hemodynamically stable. WBC noted at 13.6 likely from steroid use prior to admission.   Plan of care: The patient is accepted for admission to [facility] unit, at [facility].  Author: Dwyane Dee, MD 07/25/2022  Check [email or web address] for on-call coverage.  Nursing staff, Please call [place] number on Amion as soon as patient's arrival, so appropriate admitting provider can evaluate the pt.

## 2022-07-25 NOTE — Assessment & Plan Note (Signed)
Stable.  Continue Benicar HCT

## 2022-07-26 ENCOUNTER — Inpatient Hospital Stay (HOSPITAL_COMMUNITY): Payer: Medicare Other

## 2022-07-26 DIAGNOSIS — T17998A Other foreign object in respiratory tract, part unspecified causing other injury, initial encounter: Secondary | ICD-10-CM

## 2022-07-26 DIAGNOSIS — J9601 Acute respiratory failure with hypoxia: Secondary | ICD-10-CM | POA: Diagnosis not present

## 2022-07-26 LAB — CBC WITH DIFFERENTIAL/PLATELET
Abs Immature Granulocytes: 0.09 10*3/uL — ABNORMAL HIGH (ref 0.00–0.07)
Basophils Absolute: 0 10*3/uL (ref 0.0–0.1)
Basophils Relative: 0 %
Eosinophils Absolute: 0 10*3/uL (ref 0.0–0.5)
Eosinophils Relative: 0 %
HCT: 35.7 % — ABNORMAL LOW (ref 36.0–46.0)
Hemoglobin: 12 g/dL (ref 12.0–15.0)
Immature Granulocytes: 1 %
Lymphocytes Relative: 11 %
Lymphs Abs: 1.3 10*3/uL (ref 0.7–4.0)
MCH: 31 pg (ref 26.0–34.0)
MCHC: 33.6 g/dL (ref 30.0–36.0)
MCV: 92.2 fL (ref 80.0–100.0)
Monocytes Absolute: 0.9 10*3/uL (ref 0.1–1.0)
Monocytes Relative: 7 %
Neutro Abs: 10.1 10*3/uL — ABNORMAL HIGH (ref 1.7–7.7)
Neutrophils Relative %: 81 %
Platelets: 314 10*3/uL (ref 150–400)
RBC: 3.87 MIL/uL (ref 3.87–5.11)
RDW: 12.8 % (ref 11.5–15.5)
WBC: 12.5 10*3/uL — ABNORMAL HIGH (ref 4.0–10.5)
nRBC: 0.2 % (ref 0.0–0.2)

## 2022-07-26 LAB — COMPREHENSIVE METABOLIC PANEL
ALT: 22 U/L (ref 0–44)
AST: 26 U/L (ref 15–41)
Albumin: 3.5 g/dL (ref 3.5–5.0)
Alkaline Phosphatase: 54 U/L (ref 38–126)
Anion gap: 8 (ref 5–15)
BUN: 16 mg/dL (ref 8–23)
CO2: 28 mmol/L (ref 22–32)
Calcium: 8.8 mg/dL — ABNORMAL LOW (ref 8.9–10.3)
Chloride: 102 mmol/L (ref 98–111)
Creatinine, Ser: 0.71 mg/dL (ref 0.44–1.00)
GFR, Estimated: >60 mL/min (ref >60)
Glucose, Bld: 145 mg/dL — ABNORMAL HIGH (ref 70–99)
Potassium: 3.9 mmol/L (ref 3.5–5.1)
Sodium: 138 mmol/L (ref 135–145)
Total Bilirubin: 0.4 mg/dL (ref 0.3–1.2)
Total Protein: 6.2 g/dL — ABNORMAL LOW (ref 6.5–8.1)

## 2022-07-26 LAB — RESPIRATORY PANEL BY PCR

## 2022-07-26 LAB — PROCALCITONIN: Procalcitonin: <0.1 ng/mL

## 2022-07-26 LAB — GLUCOSE, CAPILLARY
Glucose-Capillary: 105 mg/dL — ABNORMAL HIGH (ref 70–99)
Glucose-Capillary: 140 mg/dL — ABNORMAL HIGH (ref 70–99)
Glucose-Capillary: 112 mg/dL — ABNORMAL HIGH (ref 70–99)
Glucose-Capillary: 133 mg/dL — ABNORMAL HIGH (ref 70–99)

## 2022-07-26 LAB — MAGNESIUM: Magnesium: 2.3 mg/dL (ref 1.7–2.4)

## 2022-07-26 MED ORDER — METHYLPREDNISOLONE SODIUM SUCC 40 MG IJ SOLR
40.0000 mg | Freq: Every day | INTRAMUSCULAR | Status: DC
  Administered 2022-07-26 – 2022-07-31 (×6): 40 mg via INTRAVENOUS
  Filled 2022-07-26 (×6): qty 1

## 2022-07-26 MED ORDER — HYDROCODONE BIT-HOMATROP MBR 5-1.5 MG/5ML PO SOLN
5.0000 mL | ORAL | Status: DC | PRN
  Administered 2022-07-26 – 2022-07-30 (×6): 5 mL via ORAL
  Filled 2022-07-26 (×7): qty 5

## 2022-07-26 MED ORDER — BUDESONIDE 0.25 MG/2ML IN SUSP
0.2500 mg | Freq: Two times a day (BID) | RESPIRATORY_TRACT | Status: DC
  Administered 2022-07-26 – 2022-07-29 (×7): 0.25 mg via RESPIRATORY_TRACT
  Filled 2022-07-26 (×7): qty 2

## 2022-07-26 MED ORDER — ARFORMOTEROL TARTRATE 15 MCG/2ML IN NEBU
15.0000 ug | INHALATION_SOLUTION | Freq: Two times a day (BID) | RESPIRATORY_TRACT | Status: DC
  Administered 2022-07-26 – 2022-07-31 (×11): 15 ug via RESPIRATORY_TRACT
  Filled 2022-07-26 (×11): qty 2

## 2022-07-26 NOTE — Progress Notes (Signed)
Patient was placed on BIPAP post having a bronchospasm and desaturation. Patient appears to be tolerating well . RT will continue to monitor

## 2022-07-26 NOTE — Progress Notes (Signed)
Pt refused CPT at this time.

## 2022-07-26 NOTE — Consult Note (Signed)
NAME:  Caitlin Stevenson, MRN:  [id number], DOB:  Feb 07, 1951, LOS: 1 ADMISSION DATE:  07/25/2022, CONSULTATION DATE:  07/26/22 REFERRING MD:  Dwyane Dee, MD CHIEF COMPLAINT:  Atelectasis   History of Present Illness:  71 year old female with asthma who presented to [facility] ED with shortness of breath and cough and found to be hypoxemic to 80s.  She is followed at [facility] and recently seen on 07/20/22. Neg for COVID, flu, RSV however family member recently +RSV. Tried leftover azithromycin without relief. She was treated with outpatient prednisone taper, cough syrup and albuterol.  In the ED CXR with RUL collapse confirmed on CT chest including in left lower lobes. Repeat COVID, influenza neg. Started on 3L [place] for SpO2 85%. PCCM consulted for evaluation for bronchoscopy and symptom management.   Patient reports barking cough with chest congestion, difficult to cough up sputum. Associated with shortness of breath and wheezing. Steroids help but symptoms are persistent. No recent fevers, chills. Never needed oxygen in the past.   This morning she was on 5L O2 with resolved RUL atelectasis after pulmonary toilet however this afternoon she developed worsening hypoxemia even on NRB.  Pertinent  Medical History  Asthma, OSA, DM2, HTN, HLD hypothyroidism, depression, psoriasis, morbid obesity  Significant Hospital Events: Including procedures, antibiotic start and stop dates in addition to other pertinent events   11/25 Admitted to [facility]  Interim History / Subjective:  As above  Objective   Blood pressure (!) 148/73, pulse 66, temperature 98.1 F (36.7 C), temperature source Oral, resp. rate 15, height '5\' 4"'$  (1.626 m), weight 95.5 kg, SpO2 100 %.        Intake/Output Summary (Last 24 hours) at 07/26/2022 1109 Last data filed at 07/26/2022 0645 Gross per 24 hour  Intake 250 ml  Output 650 ml  Net -400 ml   Filed Weights   07/25/22 1444 07/25/22 2100  Weight:  94.7 kg 95.5 kg   Physical Exam: General: Well-appearing, moderate respiratory distress, diaphoresis HENT: [place], AT, OP clear, MMM, NRB Eyes: EOMI, no scleral icterus Respiratory: Slightly diminished but airway bilaterally.  Rhonchi  Cardiovascular: Tachycardic, RR, -M/R/G, no JVD GI: BS+, soft, nontender Extremities:-Edema,-tenderness Neuro: AAO x4, CNII-XII grossly intact GU: Foley in place   Resolved Hospital Problem list   N/A  Assessment & Plan:   Acute hypoxemic respiratory failure Secondary to viral etiology: Recent RSV exposure, RVP neg this admission Secondary atelectasis with RUL, LLL lung collapse - improved with conservative management Cough variant asthma -Start BiPAP. If fails may need intubation -Nebs: Pulmicort, Brovana, Duonebs -IV solumedrol -Agree with DC antibiotics. Neg procal. No infiltrate. Alternative dx  HTN -Continue home BP meds  DM2 -SSI  Hypothyroidism -Continue synthroid   Best Practice (right click and "Reselect all SmartList Selections" daily)   Per primary team  Labs   CBC: Recent Labs  Lab 07/25/22 1458 07/26/22 0242  WBC 13.6* 12.5*  NEUTROABS 11.8* 10.1*  HGB 12.9 12.0  HCT 37.3 35.7*  MCV 90.5 92.2  PLT 316 017    Basic Metabolic Panel: Recent Labs  Lab 07/25/22 1458 07/26/22 0242  NA 134* 138  K 3.6 3.9  CL 100 102  CO2 25 28  GLUCOSE 104* 145*  BUN 19 16  CREATININE 0.77 0.71  CALCIUM 8.7* 8.8*  MG  --  2.3   GFR: Estimated Creatinine Clearance: 71.2 mL/min (by C-G formula based on SCr of 0.71 mg/dL). Recent Labs  Lab 07/25/22 1458 07/26/22  0242  PROCALCITON  --  <0.10  WBC 13.6* 12.5*    Liver Function Tests: Recent Labs  Lab 07/26/22 0242  AST 26  ALT 22  ALKPHOS 54  BILITOT 0.4  PROT 6.2*  ALBUMIN 3.5   No results for input(s): "LIPASE", "AMYLASE" in the last 168 hours. No results for input(s): "AMMONIA" in the last 168 hours.  ABG No results found for: "PHART", "PCO2ART", "PO2ART",  "HCO3", "TCO2", "ACIDBASEDEF", "O2SAT"   Coagulation Profile: No results for input(s): "INR", "PROTIME" in the last 168 hours.  Cardiac Enzymes: No results for input(s): "CKTOTAL", "CKMB", "CKMBINDEX", "TROPONINI" in the last 168 hours.  HbA1C: Hgb A1c MFr Bld  Date/Time Value Ref Range Status  04/12/2019 10:28 AM 5.5 4.8 - 5.6 % Final    Comment:    (NOTE) Pre diabetes:          5.7%-6.4% Diabetes:              >6.4% Glycemic control for   <7.0% adults with diabetes   04/17/2016 10:16 AM 5.5 4.6 - 6.5 % Final    Comment:    Glycemic Control Guidelines for People with Diabetes:Non Diabetic:  <6%Goal of Therapy: <7%Additional Action Suggested:  >8%     CBG: Recent Labs  Lab 07/25/22 2249 07/26/22 0746  GLUCAP 176* 105*    Review of Systems:   Review of Systems  Constitutional:  Negative for chills, diaphoresis, fever, malaise/fatigue and weight loss.  HENT:  Negative for congestion.   Respiratory:  Positive for cough and shortness of breath. Negative for hemoptysis, sputum production and wheezing.   Cardiovascular:  Negative for chest pain, palpitations and leg swelling.     Past Medical History:  She,  has a past medical history of Allergy, Arthritis, Chronic back pain, Depression, Diabetes mellitus without complication ([place]), History of carpal tunnel syndrome, Hypertension, Hypothyroidism, Migraine, Obese, Pneumonia, Psoriasis, and Sleep apnea.   Surgical History:   Past Surgical History:  Procedure Laterality Date   ABDOMINAL HYSTERECTOMY     CARPAL TUNNEL RELEASE     CERVICAL FUSION     COLONOSCOPY     FRACTURE SURGERY     tibia fracture   TOTAL KNEE ARTHROPLASTY Left 04/17/2019   Procedure: TOTAL KNEE ARTHROPLASTY;  Surgeon: Vickey Huger, MD;  Location: [facility] ORS;  Service: Orthopedics;  Laterality: Left;   UPPER GI ENDOSCOPY       Social History:   reports that she has never smoked. She has never used smokeless tobacco. She reports that she does not drink  alcohol and does not use drugs.   Family History:  Her family history includes Hypertension in her mother.   Allergies Allergies  Allergen Reactions   Atenolol     Esophageal Spasms   Minocycline Hcl     Migraines and Dizziness   Morphine Sulfate Nausea And Vomiting   Amoxicillin Rash    Did it involve swelling of the face/tongue/throat, SOB, or low BP? No Did it involve sudden or severe rash/hives, skin peeling, or any reaction on the inside of your mouth or nose? No Did you need to seek medical attention at a hospital or doctor's office? No When did it last happen?    Childhood allergy   If all above answers are "NO", may proceed with cephalosporin use.    Penicillins Rash    Did it involve swelling of the face/tongue/throat, SOB, or low BP? No Did it involve sudden or severe rash/hives, skin peeling, or any reaction on  the inside of your mouth or nose? No Did you need to seek medical attention at a hospital or doctor's office? No When did it last happen?    Childhood allergy   If all above answers are "NO", may proceed with cephalosporin use.     Home Medications  Prior to Admission medications   Medication Sig Start Date End Date Taking? Authorizing Provider  albuterol (PROVENTIL) (2.5 MG/3ML) 0.083% nebulizer solution Take 3 mLs (2.5 mg total) by nebulization every 6 (six) hours as needed for wheezing or shortness of breath. 06/04/22  Yes Hunsucker, Bonna Gains, MD  albuterol (VENTOLIN HFA) 108 (90 Base) MCG/ACT inhaler Inhale 2 puffs into the lungs every 6 (six) hours as needed for wheezing or shortness of breath. 06/04/22  Yes Hunsucker, Bonna Gains, MD  citalopram (CELEXA) 40 MG tablet Take 40 mg by mouth at bedtime.   Yes [provider]  estradiol (VIVELLE-DOT) 0.1 MG/24HR patch Place 1 patch onto the skin 2 (two) times a week.   Yes [provider]  fluticasone furoate-vilanterol (BREO ELLIPTA) 200-25 MCG/ACT AEPB Inhale 1 puff into the lungs daily. 06/22/22   Yes Icard, Bradley L, DO  HYDROcodone bit-homatropine (HYCODAN) 5-1.5 MG/5ML syrup Take 5 mLs by mouth every 6 (six) hours as needed for cough. 07/20/22  Yes Spero Geralds, MD  hydrOXYzine (ATARAX) 25 MG tablet Take 25 mg by mouth at bedtime.   Yes [provider]  levothyroxine (SYNTHROID, LEVOTHROID) 137 MCG tablet Take 1 tablet (137 mcg total) by mouth daily before breakfast. Need annual appointment with labs for further refills Patient taking differently: Take 137 mcg by mouth as directed. Take 1 tablet (137.5 mg) daily Mon-Fri. On Saturday Take 1/2 tablet (68.5 mg) & Hold on Sunday. Need annual appointment with labs for further refills 06/06/18  Yes Hoyt Koch, MD  olmesartan-hydrochlorothiazide (BENICAR HCT) 20-12.5 MG tablet Take 1 tablet by mouth daily. 03/23/19  Yes [provider]  predniSONE (DELTASONE) 10 MG tablet Take 4 tabs by mouth once daily x4 days, then 3 tabs x4 days, 2 tabs x4 days, 1 tab x4 days and stop. 07/20/22  Yes Spero Geralds, MD  progesterone (PROMETRIUM) 100 MG capsule Take 100 mg by mouth at bedtime as needed (sleep). 07/16/22  Yes [provider]  halobetasol (ULTRAVATE) 0.05 % ointment Apply topically 2 (two) times daily. Patient not taking: Reported on 07/25/2022 10/17/15   Hoyt Koch, MD     Critical care time: 60 min    The patient is critically ill with multiple organ systems failure and requires high complexity decision making for assessment and support, frequent evaluation and titration of therapies, application of advanced monitoring technologies and extensive interpretation of multiple databases.    Rodman Pickle, M.D. [facility]/Critical Care Medicine 07/26/2022 2:11 PM   Please see Amion for pager number to reach on-call Pulmonary and Critical Care Team.

## 2022-07-26 NOTE — Progress Notes (Signed)
Pt said that her MD came in and she wants her to wear bipap only if needed. Pt said she is ok with the o2 at this time. No resp distress noted. Order for bipap was not in place. Order placed for bipap PRN, machine remained bedside if needed.

## 2022-07-26 NOTE — Progress Notes (Signed)
Progress Note    Caitlin Stevenson   JKD:[id number]  DOB: [birth year]  DOA: 07/25/2022     1 PCP: Reynold Bowen, MD  Initial CC: SOB, cough  Hospital Course: Caitlin Stevenson is a 71 yo female with PMH asthma (follows with pulmonology), HTN, hypothyroidism, depression, psoriasis, sleep apnea. Patient presented to [facility] with worsening shortness of breath and cough.  She was found to be in the 80s on room air on presentation. She was recently seen outpatient by pulmonology on 07/20/2022 and underwent viral testing which was negative for COVID, flu, RSV.  She did have exposure to a grandchild with RSV however testing for the patient was negative on 11/20.    She has been on a prednisone taper outpatient along with hycodan and albuterol for symptom management at home.  CXR obtained showed concern for right upper lobe collapse.  She then underwent CT chest which showed "near complete collapse of the right upper and left lower lobes". Concern was for underlying mucous plugging.   Repeat Covid and Flu testing at [facility] again were negative.   She was requiring salter HF 8 L on workup to maintain adequate oxygenation and was transferred to [facility] for further treatment and workup. Repeat RVP testing on admission was also negative.  Pulmonology was consulted.  Interval History:  Patient breathing a little better when seen this morning.  Having some coughing and moving better air but still feels "choked" at times. Oxygen has also been weaned to some since admission. CXR showed improvement this morning as well.  Daughter also present bedside and update given with questions answered.  Assessment and Plan: * Acute hypoxemic respiratory failure (HCC) - CXR and CT chest consistent with mucous plugging accounting for etiology - See mucous plugging - Continue weaning as able.  Not on oxygen at home  Mucus plug in respiratory tract - CXT and CT chest consistent with what appears to be mucous plugging in  setting of URI - responded to CPT as evidenced by improvement in CXR this am - pulmonology consulted for further assistance also - continue current treatment; pulmonology has added further treatment on as well - PCT negative; WBC considered due to steroid use; afebrile. At this time d/c abx and monitor clinically   Morbid obesity due to excess calories (HCC) Chronic.  BMI 36.14  Essential hypertension - Continue Benicar-HCTZ  Hyperlipidemia - No statin noted on home med rec  Diabetes mellitus type 2 in obese (HCC) - Follow-up A1c - Continue SSI and CBG monitoring  Hypothyroidism Continue Synthroid 137 mcg daily.   Old records reviewed in assessment of this patient  Antimicrobials: Rocephin and azithro 11/25 x 1  DVT prophylaxis:  SCDs Start: 07/25/22 2211   Code Status:   Code Status: Full Code  Mobility Assessment (last 72 hours)     Mobility Assessment   No documentation.           Barriers to discharge:  Disposition Plan:  Home 1-2 days Status is: Inpt  Objective: Blood pressure (!) 148/73, pulse 66, temperature 98.5 F (36.9 C), temperature source Oral, resp. rate 15, height '5\' 4"'$  (1.626 m), weight 95.5 kg, SpO2 99 %.  Examination:  Physical Exam Constitutional:      Appearance: Normal appearance.  HENT:     Head: Normocephalic and atraumatic.     Mouth/Throat:     Mouth: Mucous membranes are moist.  Eyes:     Extraocular Movements: Extraocular movements intact.  Cardiovascular:  Rate and Rhythm: Normal rate and regular rhythm.  Pulmonary:     Effort: No respiratory distress.     Breath sounds: No wheezing.     Comments: Coarse sounds bilaterally  Abdominal:     General: Bowel sounds are normal. There is no distension.     Palpations: Abdomen is soft.     Tenderness: There is no abdominal tenderness.  Musculoskeletal:        General: Normal range of motion.     Cervical back: Normal range of motion and neck supple.  Skin:    General:  Skin is warm and dry.  Neurological:     General: No focal deficit present.     Mental Status: She is alert.  Psychiatric:        Mood and Affect: Mood normal.      Consultants:  Pulmonology   Procedures:    Data Reviewed: Results for orders placed or performed during the hospital encounter of 07/25/22 (from the past 24 hour(s))  Resp Panel by RT-PCR (Flu A&B, Covid) Anterior Nasal Swab     Status: None   Collection Time: 07/25/22  2:58 PM   Specimen: Anterior Nasal Swab  Result Value Ref Range   SARS Coronavirus 2 by RT PCR NEGATIVE NEGATIVE   Influenza A by PCR NEGATIVE NEGATIVE   Influenza B by PCR NEGATIVE NEGATIVE  CBC with Differential     Status: Abnormal   Collection Time: 07/25/22  2:58 PM  Result Value Ref Range   WBC 13.6 (H) 4.0 - 10.5 K/uL   RBC 4.12 3.87 - 5.11 MIL/uL   Hemoglobin 12.9 12.0 - 15.0 g/dL   HCT 37.3 36.0 - 46.0 %   MCV 90.5 80.0 - 100.0 fL   MCH 31.3 26.0 - 34.0 pg   MCHC 34.6 30.0 - 36.0 g/dL   RDW 12.8 11.5 - 15.5 %   Platelets 316 150 - 400 K/uL   nRBC 0.0 0.0 - 0.2 %   Neutrophils Relative % 86 %   Neutro Abs 11.8 (H) 1.7 - 7.7 K/uL   Lymphocytes Relative 8 %   Lymphs Abs 1.2 0.7 - 4.0 K/uL   Monocytes Relative 4 %   Monocytes Absolute 0.5 0.1 - 1.0 K/uL   Eosinophils Relative 1 %   Eosinophils Absolute 0.1 0.0 - 0.5 K/uL   Basophils Relative 0 %   Basophils Absolute 0.0 0.0 - 0.1 K/uL   Immature Granulocytes 1 %   Abs Immature Granulocytes 0.08 (H) 0.00 - 0.07 K/uL  Basic metabolic panel     Status: Abnormal   Collection Time: 07/25/22  2:58 PM  Result Value Ref Range   Sodium 134 (L) 135 - 145 mmol/L   Potassium 3.6 3.5 - 5.1 mmol/L   Chloride 100 98 - 111 mmol/L   CO2 25 22 - 32 mmol/L   Glucose, Bld 104 (H) 70 - 99 mg/dL   BUN 19 8 - 23 mg/dL   Creatinine, Ser 0.77 0.44 - 1.00 mg/dL   Calcium 8.7 (L) 8.9 - 10.3 mg/dL   GFR, Estimated >60 >60 mL/min   Anion gap 9 5 - 15  Troponin I (High Sensitivity)     Status: None    Collection Time: 07/25/22  2:58 PM  Result Value Ref Range   Troponin I (High Sensitivity) 7 <18 ng/L  Brain natriuretic peptide     Status: Abnormal   Collection Time: 07/25/22  2:58 PM  Result Value Ref Range   B  Natriuretic Peptide 144.9 (H) 0.0 - 100.0 pg/mL  Troponin I (High Sensitivity)     Status: None   Collection Time: 07/25/22  5:07 PM  Result Value Ref Range   Troponin I (High Sensitivity) 9 <18 ng/L  MRSA Next Gen by PCR, Nasal     Status: None   Collection Time: 07/25/22  8:52 PM   Specimen: Nasal Mucosa; Nasal Swab  Result Value Ref Range   MRSA by PCR Next Gen NOT DETECTED NOT DETECTED  Respiratory (~20 pathogens) panel by PCR     Status: None   Collection Time: 07/25/22 10:10 PM   Specimen: Nasopharyngeal Swab; Respiratory  Result Value Ref Range   Adenovirus NOT DETECTED NOT DETECTED   Coronavirus 229E NOT DETECTED NOT DETECTED   Coronavirus HKU1 NOT DETECTED NOT DETECTED   Coronavirus NL63 NOT DETECTED NOT DETECTED   Coronavirus OC43 NOT DETECTED NOT DETECTED   Metapneumovirus NOT DETECTED NOT DETECTED   Rhinovirus / Enterovirus NOT DETECTED NOT DETECTED   Influenza A NOT DETECTED NOT DETECTED   Influenza B NOT DETECTED NOT DETECTED   Parainfluenza Virus 1 NOT DETECTED NOT DETECTED   Parainfluenza Virus 2 NOT DETECTED NOT DETECTED   Parainfluenza Virus 3 NOT DETECTED NOT DETECTED   Parainfluenza Virus 4 NOT DETECTED NOT DETECTED   Respiratory Syncytial Virus NOT DETECTED NOT DETECTED   Bordetella pertussis NOT DETECTED NOT DETECTED   Bordetella Parapertussis NOT DETECTED NOT DETECTED   Chlamydophila pneumoniae NOT DETECTED NOT DETECTED   Mycoplasma pneumoniae NOT DETECTED NOT DETECTED  Glucose, capillary     Status: Abnormal   Collection Time: 07/25/22 10:49 PM  Result Value Ref Range   Glucose-Capillary 176 (H) 70 - 99 mg/dL  Comprehensive metabolic panel     Status: Abnormal   Collection Time: 07/26/22  2:42 AM  Result Value Ref Range   Sodium 138 135  - 145 mmol/L   Potassium 3.9 3.5 - 5.1 mmol/L   Chloride 102 98 - 111 mmol/L   CO2 28 22 - 32 mmol/L   Glucose, Bld 145 (H) 70 - 99 mg/dL   BUN 16 8 - 23 mg/dL   Creatinine, Ser 0.71 0.44 - 1.00 mg/dL   Calcium 8.8 (L) 8.9 - 10.3 mg/dL   Total Protein 6.2 (L) 6.5 - 8.1 g/dL   Albumin 3.5 3.5 - 5.0 g/dL   AST 26 15 - 41 U/L   ALT 22 0 - 44 U/L   Alkaline Phosphatase 54 38 - 126 U/L   Total Bilirubin 0.4 0.3 - 1.2 mg/dL   GFR, Estimated >60 >60 mL/min   Anion gap 8 5 - 15  CBC with Differential/Platelet     Status: Abnormal   Collection Time: 07/26/22  2:42 AM  Result Value Ref Range   WBC 12.5 (H) 4.0 - 10.5 K/uL   RBC 3.87 3.87 - 5.11 MIL/uL   Hemoglobin 12.0 12.0 - 15.0 g/dL   HCT 35.7 (L) 36.0 - 46.0 %   MCV 92.2 80.0 - 100.0 fL   MCH 31.0 26.0 - 34.0 pg   MCHC 33.6 30.0 - 36.0 g/dL   RDW 12.8 11.5 - 15.5 %   Platelets 314 150 - 400 K/uL   nRBC 0.2 0.0 - 0.2 %   Neutrophils Relative % 81 %   Neutro Abs 10.1 (H) 1.7 - 7.7 K/uL   Lymphocytes Relative 11 %   Lymphs Abs 1.3 0.7 - 4.0 K/uL   Monocytes Relative 7 %   Monocytes Absolute  0.9 0.1 - 1.0 K/uL   Eosinophils Relative 0 %   Eosinophils Absolute 0.0 0.0 - 0.5 K/uL   Basophils Relative 0 %   Basophils Absolute 0.0 0.0 - 0.1 K/uL   Immature Granulocytes 1 %   Abs Immature Granulocytes 0.09 (H) 0.00 - 0.07 K/uL  Magnesium     Status: None   Collection Time: 07/26/22  2:42 AM  Result Value Ref Range   Magnesium 2.3 1.7 - 2.4 mg/dL  Procalcitonin - Baseline     Status: None   Collection Time: 07/26/22  2:42 AM  Result Value Ref Range   Procalcitonin <0.10 ng/mL  Glucose, capillary     Status: Abnormal   Collection Time: 07/26/22  7:46 AM  Result Value Ref Range   Glucose-Capillary 105 (H) 70 - 99 mg/dL   Comment 1 Notify RN    Comment 2 Document in Chart   Glucose, capillary     Status: Abnormal   Collection Time: 07/26/22 11:48 AM  Result Value Ref Range   Glucose-Capillary 140 (H) 70 - 99 mg/dL   Comment 1  Notify RN    Comment 2 Document in Chart     I have Reviewed nursing notes, Vitals, and Lab results since pt's last encounter. Pertinent lab results : see above I have ordered test including BMP, CBC, Mg I have reviewed the last note from staff over past 24 hours I have discussed pt's care plan and test results with nursing staff, case manager  Time spent: Greater than 50% of the 55 minute visit was spent in counseling/coordination of care for the patient as laid out in the A&P.   LOS: 1 day   Dwyane Dee, MD [facility] 07/26/2022, 1:16 PM

## 2022-07-26 NOTE — Hospital Course (Signed)
Caitlin Stevenson is a 71 yo female with PMH asthma (follows with pulmonology), HTN, hypothyroidism, depression, psoriasis, sleep apnea. Patient presented to [facility] with worsening shortness of breath and cough.  She was found to be in the 80s on room air on presentation. She was recently seen outpatient by pulmonology on 07/20/2022 and underwent viral testing which was negative for COVID, flu, RSV.  She did have exposure to a grandchild with RSV however testing for the patient was negative on 11/20.    She has been on a prednisone taper outpatient along with hycodan and albuterol for symptom management at home.  CXR obtained showed concern for right upper lobe collapse.  She then underwent CT chest which showed "near complete collapse of the right upper and left lower lobes". Concern was for underlying mucous plugging.   Repeat Covid and Flu testing at [facility] again were negative.   She was requiring salter HF 8 L on workup to maintain adequate oxygenation and was transferred to [facility] for further treatment and workup. Repeat RVP testing on admission was also negative.  Pulmonology was consulted.

## 2022-07-27 ENCOUNTER — Inpatient Hospital Stay (HOSPITAL_COMMUNITY): Payer: Medicare Other

## 2022-07-27 DIAGNOSIS — J9601 Acute respiratory failure with hypoxia: Secondary | ICD-10-CM | POA: Diagnosis not present

## 2022-07-27 DIAGNOSIS — T17998A Other foreign object in respiratory tract, part unspecified causing other injury, initial encounter: Secondary | ICD-10-CM | POA: Diagnosis not present

## 2022-07-27 LAB — CBC WITH DIFFERENTIAL/PLATELET
Abs Immature Granulocytes: 0.09 10*3/uL — ABNORMAL HIGH (ref 0.00–0.07)
Basophils Absolute: 0 10*3/uL (ref 0.0–0.1)
Basophils Relative: 0 %
Eosinophils Absolute: 0 10*3/uL (ref 0.0–0.5)
Eosinophils Relative: 0 %
HCT: 37.8 % (ref 36.0–46.0)
Hemoglobin: 12.3 g/dL (ref 12.0–15.0)
Immature Granulocytes: 1 %
Lymphocytes Relative: 10 %
Lymphs Abs: 1.4 10*3/uL (ref 0.7–4.0)
MCH: 31.1 pg (ref 26.0–34.0)
MCHC: 32.5 g/dL (ref 30.0–36.0)
MCV: 95.5 fL (ref 80.0–100.0)
Monocytes Absolute: 0.9 10*3/uL (ref 0.1–1.0)
Monocytes Relative: 6 %
Neutro Abs: 12.1 10*3/uL — ABNORMAL HIGH (ref 1.7–7.7)
Neutrophils Relative %: 83 %
Platelets: 323 10*3/uL (ref 150–400)
RBC: 3.96 MIL/uL (ref 3.87–5.11)
RDW: 12.9 % (ref 11.5–15.5)
WBC: 14.5 10*3/uL — ABNORMAL HIGH (ref 4.0–10.5)
nRBC: 0 % (ref 0.0–0.2)

## 2022-07-27 LAB — HEMOGLOBIN A1C
Hgb A1c MFr Bld: 5.8 % — ABNORMAL HIGH (ref 4.8–5.6)
Mean Plasma Glucose: 120 mg/dL

## 2022-07-27 LAB — SEDIMENTATION RATE: Sed Rate: 25 mm/hr — ABNORMAL HIGH (ref 0–22)

## 2022-07-27 LAB — GLUCOSE, CAPILLARY
Glucose-Capillary: 99 mg/dL (ref 70–99)
Glucose-Capillary: 113 mg/dL — ABNORMAL HIGH (ref 70–99)
Glucose-Capillary: 145 mg/dL — ABNORMAL HIGH (ref 70–99)
Glucose-Capillary: 133 mg/dL — ABNORMAL HIGH (ref 70–99)

## 2022-07-27 LAB — EXPECTORATED SPUTUM ASSESSMENT W GRAM STAIN, RFLX TO RESP C

## 2022-07-27 LAB — BASIC METABOLIC PANEL
Anion gap: 10 (ref 5–15)
BUN: 19 mg/dL (ref 8–23)
CO2: 26 mmol/L (ref 22–32)
Calcium: 8.7 mg/dL — ABNORMAL LOW (ref 8.9–10.3)
Chloride: 102 mmol/L (ref 98–111)
Creatinine, Ser: 0.64 mg/dL (ref 0.44–1.00)
GFR, Estimated: >60 mL/min (ref >60)
Glucose, Bld: 116 mg/dL — ABNORMAL HIGH (ref 70–99)
Potassium: 4.1 mmol/L (ref 3.5–5.1)
Sodium: 138 mmol/L (ref 135–145)

## 2022-07-27 LAB — PROCALCITONIN: Procalcitonin: <0.1 ng/mL

## 2022-07-27 LAB — MAGNESIUM: Magnesium: 2.3 mg/dL (ref 1.7–2.4)

## 2022-07-27 MED ORDER — HALOPERIDOL LACTATE 5 MG/ML IJ SOLN
5.0000 mg | Freq: Once | INTRAMUSCULAR | Status: AC
  Administered 2022-07-27: 5 mg via INTRAVENOUS
  Filled 2022-07-27: qty 1

## 2022-07-27 MED ORDER — LORAZEPAM 2 MG/ML IJ SOLN
1.0000 mg | INTRAMUSCULAR | Status: DC | PRN
  Administered 2022-07-27 – 2022-07-29 (×4): 1 mg via INTRAVENOUS
  Filled 2022-07-27 (×5): qty 1

## 2022-07-27 MED ORDER — GUAIFENESIN ER 600 MG PO TB12
600.0000 mg | ORAL_TABLET | Freq: Two times a day (BID) | ORAL | Status: DC
  Administered 2022-07-27 – 2022-07-31 (×9): 600 mg via ORAL
  Filled 2022-07-27 (×9): qty 1

## 2022-07-27 NOTE — Progress Notes (Signed)
Attempted HHFNC, pt began to cough uncontrollably.  Placed back on [place] HFNC 10 L

## 2022-07-27 NOTE — Progress Notes (Addendum)
NAME:  Caitlin Stevenson, MRN:  [id number], DOB:  01-09-1951, LOS: 2 ADMISSION DATE:  07/25/2022 CONSULTATION DATE:  07/26/2022 REFERRING MD:  Sabino Gasser - TRH, CHIEF COMPLAINT:  Atelectasis, hypoxia   History of Present Illness:  71 year old female with asthma who presented to [place] ED with shortness of breath and cough and found to be hypoxemic to 80s.   She is followed at [facility] and recently seen on 07/20/22. Neg for COVID, flu, RSV however family member recently +RSV. Tried leftover azithromycin without relief. She was treated with outpatient prednisone taper, cough syrup and albuterol.   In the ED CXR with RUL collapse confirmed on CT chest including in left lower lobes. Repeat COVID, influenza neg. Started on 3L [place] for SpO2 85%. PCCM consulted for evaluation for bronchoscopy and symptom management.    Patient reports barking cough with chest congestion, difficult to cough up sputum. Associated with shortness of breath and wheezing. Steroids help but symptoms are persistent. No recent fevers, chills. Never needed oxygen in the past.    This morning she was on 5L O2 with resolved RUL atelectasis after pulmonary toilet however this afternoon she developed worsening hypoxemia even on NRB.  Pertinent Medical History:   Past Medical History:  Diagnosis Date   Allergy    Arthritis    Chronic back pain    Depression    Diabetes mellitus without complication ([place])    diet controlled   History of carpal tunnel syndrome    Bilateral   Hypertension    Hypothyroidism    Migraine    Obese    Pneumonia    x2   Psoriasis    Sleep apnea    mouth guard   Significant Hospital Events: Including procedures, antibiotic start and stop dates in addition to other pertinent events   11/25 - Admit to [facility] 11/26 - PCCM consulted for hypoxemia; placed on BiPAP, pulmonary toilet 11/27 - Improved hypoxia with sats > 96%, 10L HFNC. Ongoing bronchospasm  Interim History /  Subjective:  On BiPAP overnight for hypoxia Feeling overall better today, less SOB, no CP Still feels and sounds "tight" Asking to hold Chest PT today; feels this at times worsens bronchospasm Overall less O2 requirement, 10L [place] today  Objective:  Blood pressure (!) 162/73, pulse (!) 59, temperature 97.9 F (36.6 C), temperature source Axillary, resp. rate 15, height '5\' 4"'$  (1.626 m), weight 95.5 kg, SpO2 99 %.    FiO2 (%):  [40 %-100 %] 60 %   Intake/Output Summary (Last 24 hours) at 07/27/2022 0724 Last data filed at 07/27/2022 0500 Gross per 24 hour  Intake 120 ml  Output 1300 ml  Net -1180 ml   Filed Weights   07/25/22 1444 07/25/22 2100  Weight: 94.7 kg 95.5 kg   Physical Examination: General: Acutely ill-appearing elderly woman in NAD. Pleasant and conversant. HEENT: [place]/AT, anicteric sclera, PERRL, moist mucous membranes. Neuro: Awake, oriented x 4. Responds to verbal stimuli. Following commands consistently. Moves all 4 extremities spontaneously. CV: RRR, no m/g/r. PULM: Breathing even and minimally labored on 10L [place]. Lung fields diminished throughout with diffuse expiratory wheeze bilaterally, R > L. GI: Soft, nontender, nondistended. Normoactive bowel sounds. Extremities: No LE edema noted. Skin: Warm/dry, no rashes.  Resolved Hospital Problem List:    Assessment & Plan:  Acute hypoxemic respiratory failure Secondary to viral etiology: Recent RSV exposure, RVP neg this admission Secondary atelectasis with RUL, LLL lung collapse - improved with conservative management;  PCT negative and no evidence of infiltrate on CXR Cough variant asthma ?OSA - Continue supplemental O2 support - BiPAP PRN - Wean O2 for sat > 90% - Bronchodilators (Pulmicort, Brovana, DuoNebs) - Continue Solumedrol - Pulmonary hygiene - Chest PT today - Repeat CT Chest - F/u expectorated sputum Cx, may need bronch if unable to produce   HTN - Continue home antihypertensives    DM2 - SSI - CBGs Q4H - Goal CBG 140-180   Hypothyroidism - Continue home Synthroid   Best Practice: (right click and "Reselect all SmartList Selections" daily)   Per Primary Team  Critical care time: N/A   Rhae Lerner [facility] 07/27/22 7:24 AM  Please see [email or web address] for pager details.  From 7A-7P if no response, please call [phone] After hours, please call ELink [phone]

## 2022-07-27 NOTE — Progress Notes (Signed)
Patient requested to "take a break" from the BiPAP due to discomfort. Pt stable at 100% Sp02 on 10L [place]ter nasal cannula.

## 2022-07-27 NOTE — Progress Notes (Signed)
Pt off of bipap and tolerating [place] 10L. Pt would like to sleep no refused CPT. Bipap remained bedside if needed.

## 2022-07-27 NOTE — Progress Notes (Signed)
Progress Note    Caitlin Stevenson   UXN:[id number]  DOB: 05-19-1951  DOA: 07/25/2022     2 PCP: Reynold Bowen, MD  Initial CC: SOB, cough  Hospital Course: Caitlin Stevenson is a 71 yo female with PMH asthma (follows with pulmonology), HTN, hypothyroidism, depression, psoriasis, sleep apnea. Patient presented to [facility] with worsening shortness of breath and cough.  She was found to be in the 80s on room air on presentation. She was recently seen outpatient by pulmonology on 07/20/2022 and underwent viral testing which was negative for COVID, flu, RSV.  She did have exposure to a grandchild with RSV however testing for the patient was negative on 11/20.    She has been on a prednisone taper outpatient along with hycodan and albuterol for symptom management at home.  CXR obtained showed concern for right upper lobe collapse.  She then underwent CT chest which showed "near complete collapse of the right upper and left lower lobes". Concern was for underlying mucous plugging.   Repeat Covid and Flu testing at [facility] again were negative.   She was requiring salter HF 8 L on workup to maintain adequate oxygenation and was transferred to [facility] for further treatment and workup. Repeat RVP testing on admission was also negative.  Pulmonology was consulted.  Interval History:  No events overnight.  Declined CPT a couple times due to fear of having excessive coughing spell as she had yesterday causing her to feel extremely short of breath and going on BiPAP.  Seems to be moving air better today and breathing a little better.  Oxygen weaned a little bit and remains on salter high flow.  Assessment and Plan: * Acute hypoxemic respiratory failure (HCC) - CXR and CT chest consistent with mucous plugging accounting for etiology - See mucous plugging - Continue weaning as able.  Not on oxygen at home  Mucus plug in respiratory tract - CXT and CT chest consistent with what appears to be mucous plugging  in setting of URI - responded to CPT as evidenced by improvement in CXR  - pulmonology consulted for further assistance also - continue current treatment; pulmonology has added further treatment on as well - PCT negative; WBC considered due to steroid use; afebrile. At this time d/c abx and monitor clinically  - now planning for bronch per pulmonology after evaluation today - follow up CT and autoimmune panel   Morbid obesity due to excess calories (HCC) Chronic. BMI 36.14  Essential hypertension - Continue Benicar-HCTZ  Hyperlipidemia - No statin noted on home med rec  Diabetes mellitus type 2 in obese (HCC) - A1c 5.8% - Continue SSI and CBG monitoring  Hypothyroidism Continue Synthroid 137 mcg daily.   Old records reviewed in assessment of this patient  Antimicrobials: Rocephin and azithro 11/25 x 1  DVT prophylaxis:  SCDs Start: 07/25/22 2211   Code Status:   Code Status: Full Code  Mobility Assessment (last 72 hours)     Mobility Assessment   No documentation.           Barriers to discharge:  Disposition Plan:  Home 1-2 days Status is: Inpt  Objective: Blood pressure 115/67, pulse 96, temperature 98.4 F (36.9 C), temperature source Axillary, resp. rate 16, height '5\' 4"'$  (1.626 m), weight 95.5 kg, SpO2 94 %.  Examination:  Physical Exam Constitutional:      Appearance: Normal appearance.  HENT:     Head: Normocephalic and atraumatic.     Mouth/Throat:  Mouth: Mucous membranes are moist.  Eyes:     Extraocular Movements: Extraocular movements intact.  Cardiovascular:     Rate and Rhythm: Normal rate and regular rhythm.  Pulmonary:     Effort: No respiratory distress.     Breath sounds: Rhonchi present. No wheezing.     Comments: Coarse sounds bilaterally  Abdominal:     General: Bowel sounds are normal. There is no distension.     Palpations: Abdomen is soft.     Tenderness: There is no abdominal tenderness.  Musculoskeletal:         General: Normal range of motion.     Cervical back: Normal range of motion and neck supple.  Skin:    General: Skin is warm and dry.  Neurological:     General: No focal deficit present.     Mental Status: She is alert.  Psychiatric:        Mood and Affect: Mood normal.      Consultants:  Pulmonology   Procedures:    Data Reviewed: Results for orders placed or performed during the hospital encounter of 07/25/22 (from the past 24 hour(s))  Glucose, capillary     Status: Abnormal   Collection Time: 07/26/22  3:55 PM  Result Value Ref Range   Glucose-Capillary 112 (H) 70 - 99 mg/dL  Glucose, capillary     Status: Abnormal   Collection Time: 07/26/22  9:52 PM  Result Value Ref Range   Glucose-Capillary 133 (H) 70 - 99 mg/dL   Comment 1 Notify RN    Comment 2 Document in Chart   Procalcitonin     Status: None   Collection Time: 07/27/22  2:42 AM  Result Value Ref Range   Procalcitonin <0.10 ng/mL  Basic metabolic panel     Status: Abnormal   Collection Time: 07/27/22  2:42 AM  Result Value Ref Range   Sodium 138 135 - 145 mmol/L   Potassium 4.1 3.5 - 5.1 mmol/L   Chloride 102 98 - 111 mmol/L   CO2 26 22 - 32 mmol/L   Glucose, Bld 116 (H) 70 - 99 mg/dL   BUN 19 8 - 23 mg/dL   Creatinine, Ser 0.64 0.44 - 1.00 mg/dL   Calcium 8.7 (L) 8.9 - 10.3 mg/dL   GFR, Estimated >60 >60 mL/min   Anion gap 10 5 - 15  CBC with Differential/Platelet     Status: Abnormal   Collection Time: 07/27/22  2:42 AM  Result Value Ref Range   WBC 14.5 (H) 4.0 - 10.5 K/uL   RBC 3.96 3.87 - 5.11 MIL/uL   Hemoglobin 12.3 12.0 - 15.0 g/dL   HCT 37.8 36.0 - 46.0 %   MCV 95.5 80.0 - 100.0 fL   MCH 31.1 26.0 - 34.0 pg   MCHC 32.5 30.0 - 36.0 g/dL   RDW 12.9 11.5 - 15.5 %   Platelets 323 150 - 400 K/uL   nRBC 0.0 0.0 - 0.2 %   Neutrophils Relative % 83 %   Neutro Abs 12.1 (H) 1.7 - 7.7 K/uL   Lymphocytes Relative 10 %   Lymphs Abs 1.4 0.7 - 4.0 K/uL   Monocytes Relative 6 %   Monocytes  Absolute 0.9 0.1 - 1.0 K/uL   Eosinophils Relative 0 %   Eosinophils Absolute 0.0 0.0 - 0.5 K/uL   Basophils Relative 0 %   Basophils Absolute 0.0 0.0 - 0.1 K/uL   Immature Granulocytes 1 %   Abs Immature Granulocytes 0.09 (  H) 0.00 - 0.07 K/uL  Magnesium     Status: None   Collection Time: 07/27/22  2:42 AM  Result Value Ref Range   Magnesium 2.3 1.7 - 2.4 mg/dL  Glucose, capillary     Status: None   Collection Time: 07/27/22  8:17 AM  Result Value Ref Range   Glucose-Capillary 99 70 - 99 mg/dL   Comment 1 Notify RN    Comment 2 Document in Chart   Sedimentation rate     Status: Abnormal   Collection Time: 07/27/22 10:10 AM  Result Value Ref Range   Sed Rate 25 (H) 0 - 22 mm/hr  Glucose, capillary     Status: Abnormal   Collection Time: 07/27/22 12:02 PM  Result Value Ref Range   Glucose-Capillary 113 (H) 70 - 99 mg/dL   Comment 1 Notify RN    Comment 2 Document in Chart     I have Reviewed nursing notes, Vitals, and Lab results since pt's last encounter. Pertinent lab results : see above I have ordered test including BMP, CBC, Mg I have reviewed the last note from staff over past 24 hours I have discussed pt's care plan and test results with nursing staff, case manager  Time spent: Greater than 50% of the 55 minute visit was spent in counseling/coordination of care for the patient as laid out in the A&P.   LOS: 2 days   Dwyane Dee, MD [facility] 07/27/2022, 2:06 PM

## 2022-07-27 NOTE — Progress Notes (Signed)
Pt unable to tolerate chest vest due to bronchospasms.  Gave nebulizer while using vest, pt still did not tolerate.

## 2022-07-27 NOTE — Progress Notes (Signed)
PT could not tolerate CPT

## 2022-07-28 ENCOUNTER — Encounter (HOSPITAL_COMMUNITY): Payer: Self-pay | Admitting: Anesthesiology

## 2022-07-28 ENCOUNTER — Encounter (HOSPITAL_COMMUNITY)
Admission: EM | Disposition: A | Discharge status: Home Health | Payer: Self-pay | Source: Home / Self Care | Attending: Internal Medicine

## 2022-07-28 DIAGNOSIS — T17998A Other foreign object in respiratory tract, part unspecified causing other injury, initial encounter: Secondary | ICD-10-CM | POA: Diagnosis not present

## 2022-07-28 DIAGNOSIS — J9601 Acute respiratory failure with hypoxia: Secondary | ICD-10-CM | POA: Diagnosis not present

## 2022-07-28 LAB — GLUCOSE, CAPILLARY
Glucose-Capillary: 100 mg/dL — ABNORMAL HIGH (ref 70–99)
Glucose-Capillary: 97 mg/dL (ref 70–99)
Glucose-Capillary: 152 mg/dL — ABNORMAL HIGH (ref 70–99)
Glucose-Capillary: 137 mg/dL — ABNORMAL HIGH (ref 70–99)

## 2022-07-28 LAB — CBC WITH DIFFERENTIAL/PLATELET
Abs Immature Granulocytes: 0.09 10*3/uL — ABNORMAL HIGH (ref 0.00–0.07)
Basophils Absolute: 0 10*3/uL (ref 0.0–0.1)
Basophils Relative: 0 %
Eosinophils Absolute: 0 10*3/uL (ref 0.0–0.5)
Eosinophils Relative: 0 %
HCT: 36.1 % (ref 36.0–46.0)
Hemoglobin: 11.9 g/dL — ABNORMAL LOW (ref 12.0–15.0)
Immature Granulocytes: 1 %
Lymphocytes Relative: 12 %
Lymphs Abs: 1.7 10*3/uL (ref 0.7–4.0)
MCH: 31.1 pg (ref 26.0–34.0)
MCHC: 33 g/dL (ref 30.0–36.0)
MCV: 94.3 fL (ref 80.0–100.0)
Monocytes Absolute: 0.9 10*3/uL (ref 0.1–1.0)
Monocytes Relative: 6 %
Neutro Abs: 11.5 10*3/uL — ABNORMAL HIGH (ref 1.7–7.7)
Neutrophils Relative %: 81 %
Platelets: 312 10*3/uL (ref 150–400)
RBC: 3.83 MIL/uL — ABNORMAL LOW (ref 3.87–5.11)
RDW: 13.2 % (ref 11.5–15.5)
WBC: 14.2 10*3/uL — ABNORMAL HIGH (ref 4.0–10.5)
nRBC: 0 % (ref 0.0–0.2)

## 2022-07-28 LAB — BASIC METABOLIC PANEL
Anion gap: 8 (ref 5–15)
BUN: 25 mg/dL — ABNORMAL HIGH (ref 8–23)
CO2: 28 mmol/L (ref 22–32)
Calcium: 8.5 mg/dL — ABNORMAL LOW (ref 8.9–10.3)
Chloride: 99 mmol/L (ref 98–111)
Creatinine, Ser: 0.77 mg/dL (ref 0.44–1.00)
GFR, Estimated: >60 mL/min (ref >60)
Glucose, Bld: 109 mg/dL — ABNORMAL HIGH (ref 70–99)
Potassium: 4 mmol/L (ref 3.5–5.1)
Sodium: 135 mmol/L (ref 135–145)

## 2022-07-28 LAB — MAGNESIUM: Magnesium: 2.3 mg/dL (ref 1.7–2.4)

## 2022-07-28 LAB — BODY FLUID CELL COUNT WITH DIFFERENTIAL: Total Nucleated Cell Count, Fluid: 52 cu mm (ref 0–1000)

## 2022-07-28 LAB — CK: Total CK: 162 U/L (ref 38–234)

## 2022-07-28 LAB — PROCALCITONIN: Procalcitonin: <0.1 ng/mL

## 2022-07-28 LAB — FERRITIN: Ferritin: 147 ng/mL (ref 11–307)

## 2022-07-28 LAB — C-REACTIVE PROTEIN: CRP: 3.8 mg/dL — ABNORMAL HIGH (ref <1.0)

## 2022-07-28 LAB — SEDIMENTATION RATE: Sed Rate: 30 mm/hr — ABNORMAL HIGH (ref 0–22)

## 2022-07-28 SURGERY — VIDEO BRONCHOSCOPY WITHOUT FLUORO
Anesthesia: General | Laterality: Bilateral

## 2022-07-28 MED ORDER — PROPOFOL 1000 MG/100ML IV EMUL
5.0000 ug/kg/min | INTRAVENOUS | Status: DC
  Administered 2022-07-28: 10 ug/kg/min via INTRAVENOUS
  Filled 2022-07-28: qty 100

## 2022-07-28 MED ORDER — PROPOFOL BOLUS VIA INFUSION
50.0000 mg | INTRAVENOUS | Status: DC | PRN
  Administered 2022-07-28 (×2): 50 mg via INTRAVENOUS

## 2022-07-28 MED ORDER — ETOMIDATE 2 MG/ML IV SOLN
INTRAVENOUS | Status: AC
  Administered 2022-07-28: 20 mg
  Filled 2022-07-28: qty 20

## 2022-07-28 MED ORDER — MIDAZOLAM HCL 2 MG/2ML IJ SOLN
INTRAMUSCULAR | Status: AC
  Administered 2022-07-28: 2 mg
  Filled 2022-07-28: qty 2

## 2022-07-28 MED ORDER — SUCCINYLCHOLINE CHLORIDE 200 MG/10ML IV SOSY
PREFILLED_SYRINGE | INTRAVENOUS | Status: AC
  Administered 2022-07-28: 120 mg
  Filled 2022-07-28: qty 10

## 2022-07-28 MED ORDER — ROCURONIUM BROMIDE 10 MG/ML (PF) SYRINGE
PREFILLED_SYRINGE | INTRAVENOUS | Status: AC
  Administered 2022-07-28: 80 mg
  Filled 2022-07-28: qty 10

## 2022-07-28 MED ORDER — PROPOFOL BOLUS VIA INFUSION: 50.0000 mg | INTRAVENOUS | Status: DC | PRN

## 2022-07-28 MED ORDER — ORAL CARE MOUTH RINSE: 15.0000 mL | OROMUCOSAL | Status: DC | PRN

## 2022-07-28 MED ORDER — FENTANYL CITRATE (PF) 100 MCG/2ML IJ SOLN
INTRAMUSCULAR | Status: AC
  Administered 2022-07-28: 100 ug
  Filled 2022-07-28: qty 2

## 2022-07-28 MED ORDER — ORAL CARE MOUTH RINSE
15.0000 mL | OROMUCOSAL | Status: DC
  Administered 2022-07-28 (×2): 15 mL via OROMUCOSAL

## 2022-07-28 NOTE — Progress Notes (Signed)
Progress Note    Caitlin Stevenson   EPP:[id number]  DOB: [birth year]  DOA: 07/25/2022     3 PCP: Caitlin Bowen, MD  Initial CC: SOB, cough  Hospital Course: [name] is a 71 yo female with PMH asthma (follows with pulmonology), HTN, hypothyroidism, depression, psoriasis, sleep apnea. Patient presented to [facility] with worsening shortness of breath and cough.  She was found to be in the 80s on room air on presentation. She was recently seen outpatient by pulmonology on 07/20/2022 and underwent viral testing which was negative for COVID, flu, RSV.  She did have exposure to a grandchild with RSV however testing for the patient was negative on 11/20.    She has been on a prednisone taper outpatient along with hycodan and albuterol for symptom management at home.  CXR obtained showed concern for right upper lobe collapse.  She then underwent CT chest which showed "near complete collapse of the right upper and left lower lobes". Concern was for underlying mucous plugging.   Repeat Covid and Flu testing at [facility] again were negative.   She was requiring salter HF 8 L on workup to maintain adequate oxygenation and was transferred to [facility] for further treatment and workup. Repeat RVP testing on admission was also negative.  Pulmonology was consulted.  Interval History:  Still having difficulty tolerating CPT due to precipitation of severe coughing spells.  Plan remains for probable bronc today with pulmonology. She received some Ativan earlier this morning when seen and she was resting comfortably. Daughter present bedside.  Assessment and Plan: * Acute hypoxemic respiratory failure (HCC) - CXR and CT chest consistent with mucous plugging accounting for etiology - See mucous plugging - Continue weaning as able.  Not on oxygen at home  Mucus plug in respiratory tract - CXT and CT chest consistent with what appears to be mucous plugging in setting of URI - responded to CPT as evidenced  by improvement in CXR  - pulmonology consulted for further assistance also - continue current treatment; pulmonology has added further treatment on as well - PCT negative; WBC considered due to steroid use; afebrile. At this time d/c abx and monitor clinically  - now planning for bronch per pulmonology; follow up findings - follow up CT and autoimmune panel   Morbid obesity due to excess calories ([place]) Chronic. BMI 36.14  Essential hypertension - Continue Benicar-HCTZ  Hyperlipidemia - No statin noted on home med rec  Diabetes mellitus type 2 in obese (HCC) - A1c 5.8% - Continue SSI and CBG monitoring  Hypothyroidism Continue Synthroid 137 mcg daily.   Old records reviewed in assessment of this patient  Antimicrobials: Rocephin and azithro 11/25 x 1  DVT prophylaxis:  SCDs Start: 07/25/22 2211   Code Status:   Code Status: Full Code  Mobility Assessment (last 72 hours)     Mobility Assessment   No documentation.           Barriers to discharge:  Disposition Plan:  Home 1-2 days Status is: Inpt  Objective: Blood pressure (!) 143/74, pulse 70, temperature 98.6 F (37 C), temperature source Oral, resp. rate 18, height '5\' 4"'$  (1.626 m), weight 95.5 kg, SpO2 90 %.  Examination:  Physical Exam Constitutional:      Appearance: Normal appearance.  HENT:     Head: Normocephalic and atraumatic.     Mouth/Throat:     Mouth: Mucous membranes are moist.  Eyes:     Extraocular Movements: Extraocular movements intact.  Cardiovascular:  Rate and Rhythm: Normal rate and regular rhythm.  Pulmonary:     Effort: No respiratory distress.     Breath sounds: Rhonchi present. No wheezing.     Comments: Coarse sounds bilaterally  Abdominal:     General: Bowel sounds are normal. There is no distension.     Palpations: Abdomen is soft.     Tenderness: There is no abdominal tenderness.  Musculoskeletal:        General: Normal range of motion.     Cervical back: Normal  range of motion and neck supple.  Skin:    General: Skin is warm and dry.  Neurological:     General: No focal deficit present.     Mental Status: She is alert.  Psychiatric:        Mood and Affect: Mood normal.      Consultants:  Pulmonology   Procedures:    Data Reviewed: Results for orders placed or performed during the hospital encounter of 07/25/22 (from the past 24 hour(s))  Expectorated Sputum Assessment w Gram Stain, Rflx to Resp Cult     Status: None   Collection Time: 07/27/22  2:28 PM   Specimen: Sputum  Result Value Ref Range   Specimen Description SPUTUM    Special Requests NONE    Sputum evaluation      THIS SPECIMEN IS ACCEPTABLE FOR SPUTUM CULTURE Performed at [facility], [place] [address], [place], [place] [address]    Report Status 07/27/2022 FINAL   Culture, Respiratory w Gram Stain     Status: None (Preliminary result)   Collection Time: 07/27/22  2:28 PM   Specimen: SPU  Result Value Ref Range   Specimen Description      SPUTUM Performed at [place] [facility] [address], [place], [place] [address]    Special Requests      NONE Reflexed from [id number] Performed at [facility], [place] [address] [place], [place] [address]    Gram Stain      RARE SQUAMOUS EPITHELIAL CELLS PRESENT RARE WBC PRESENT,BOTH PMN AND MONONUCLEAR MODERATE GRAM POSITIVE COCCI IN CHAINS MODERATE GRAM POSITIVE COCCI IN CLUSTERS    Culture      CULTURE REINCUBATED FOR BETTER GROWTH Performed at [facility], [place] [address] [place], [place] [address]    Report Status PENDING   Glucose, capillary     Status: Abnormal   Collection Time: 07/27/22  4:11 PM  Result Value Ref Range   Glucose-Capillary 145 (H) 70 - 99 mg/dL   Comment 1 Notify RN    Comment 2 Document in Chart   Glucose, capillary     Status: Abnormal   Collection Time: 07/27/22  9:57 PM  Result Value Ref Range   Glucose-Capillary 133 (H) 70 - 99 mg/dL    Comment 1 Notify RN    Comment 2 Document in Chart   Procalcitonin     Status: None   Collection Time: 07/28/22  2:42 AM  Result Value Ref Range   Procalcitonin <0.10 ng/mL  Basic metabolic panel     Status: Abnormal   Collection Time: 07/28/22  2:42 AM  Result Value Ref Range   Sodium 135 135 - 145 mmol/L   Potassium 4.0 3.5 - 5.1 mmol/L   Chloride 99 98 - 111 mmol/L   CO2 28 22 - 32 mmol/L   Glucose, Bld 109 (H) 70 - 99 mg/dL   BUN 25 (H) 8 - 23 mg/dL  Creatinine, Ser 0.77 0.44 - 1.00 mg/dL   Calcium 8.5 (L) 8.9 - 10.3 mg/dL   GFR, Estimated >60 >60 mL/min   Anion gap 8 5 - 15  CBC with Differential/Platelet     Status: Abnormal   Collection Time: 07/28/22  2:42 AM  Result Value Ref Range   WBC 14.2 (H) 4.0 - 10.5 K/uL   RBC 3.83 (L) 3.87 - 5.11 MIL/uL   Hemoglobin 11.9 (L) 12.0 - 15.0 g/dL   HCT 36.1 36.0 - 46.0 %   MCV 94.3 80.0 - 100.0 fL   MCH 31.1 26.0 - 34.0 pg   MCHC 33.0 30.0 - 36.0 g/dL   RDW 13.2 11.5 - 15.5 %   Platelets 312 150 - 400 K/uL   nRBC 0.0 0.0 - 0.2 %   Neutrophils Relative % 81 %   Neutro Abs 11.5 (H) 1.7 - 7.7 K/uL   Lymphocytes Relative 12 %   Lymphs Abs 1.7 0.7 - 4.0 K/uL   Monocytes Relative 6 %   Monocytes Absolute 0.9 0.1 - 1.0 K/uL   Eosinophils Relative 0 %   Eosinophils Absolute 0.0 0.0 - 0.5 K/uL   Basophils Relative 0 %   Basophils Absolute 0.0 0.0 - 0.1 K/uL   Immature Granulocytes 1 %   Abs Immature Granulocytes 0.09 (H) 0.00 - 0.07 K/uL  Magnesium     Status: None   Collection Time: 07/28/22  2:42 AM  Result Value Ref Range   Magnesium 2.3 1.7 - 2.4 mg/dL  CK     Status: None   Collection Time: 07/28/22  2:42 AM  Result Value Ref Range   Total CK 162 38 - 234 U/L  Sedimentation rate     Status: Abnormal   Collection Time: 07/28/22  2:42 AM  Result Value Ref Range   Sed Rate 30 (H) 0 - 22 mm/hr  C-reactive protein     Status: Abnormal   Collection Time: 07/28/22  2:42 AM  Result Value Ref Range   CRP 3.8 (H) <1.0  mg/dL  Ferritin     Status: None   Collection Time: 07/28/22  2:42 AM  Result Value Ref Range   Ferritin 147 11 - 307 ng/mL  Glucose, capillary     Status: Abnormal   Collection Time: 07/28/22  8:29 AM  Result Value Ref Range   Glucose-Capillary 100 (H) 70 - 99 mg/dL   Comment 1 Notify RN    Comment 2 Document in Chart   Glucose, capillary     Status: None   Collection Time: 07/28/22 12:02 PM  Result Value Ref Range   Glucose-Capillary 97 70 - 99 mg/dL   Comment 1 Notify RN    Comment 2 Document in Chart     I have Reviewed nursing notes, Vitals, and Lab results since pt's last encounter. Pertinent lab results : see above I have ordered test including BMP, CBC, Mg I have reviewed the last note from staff over past 24 hours I have discussed pt's care plan and test results with nursing staff, case manager  Time spent: Greater than 50% of the 55 minute visit was spent in counseling/coordination of care for the patient as laid out in the A&P.   LOS: 3 days   Dwyane Dee, MD [facility] 07/28/2022, 1:17 PM

## 2022-07-28 NOTE — Progress Notes (Signed)
PT could not tolerate CPT

## 2022-07-28 NOTE — Procedures (Signed)
Extubation note  Doing well on PS  Following commands  + cuff leak  Suctioned and extubated to 10LPM  Vent end time 1504

## 2022-07-28 NOTE — Procedures (Signed)
Bronchoscopy Procedure Note  Caitlin Stevenson  [id number]  08-Mar-1951  Date:07/28/22  Time:4:46 PM   Provider Performing:Zailynn Brandel C Tamala Julian   Procedure(s):  Flexible bronchoscopy with bronchial alveolar lavage [phone]) and Initial Therapeutic Aspiration of Tracheobronchial Tree [phone])  Indication(s) Atelectasis, non-resolving cough  Consent Risks of the procedure as well as the alternatives and risks of each were explained to the patient and/or caregiver.  Consent for the procedure was obtained and is signed in the bedside chart  Anesthesia In place for intubation  Time Out Verified patient identification, verified procedure, site/side was marked, verified correct patient position, special equipment/implants available, medications/allergies/relevant history reviewed, required imaging and test results available.   Sterile Technique Usual hand hygiene, masks, gowns, and gloves were used   Procedure Description Bronchoscope advanced through endotracheal tube and into airway.  Airways were examined down to subsegmental level with findings noted below.   Following diagnostic evaluation, Therapeutic aspiration performed in bilateral lower lobes, and RML of tenacious copious thick secretions  Findings: ***    Complications/Tolerance {[id number]:63893::"TDSK; patient tolerated the procedure well."} Chest X-ray {Is/Is Not:21021397} needed post procedure.   EBL {EBL:[id number]::"Minimal"}   Specimen(s) ***

## 2022-07-28 NOTE — Procedures (Signed)
Intubation Procedure Note  Caitlin Stevenson  [id number]  [birth year]  Date:07/28/22  Time:2:39 PM   Provider Performing:Apollonia Amini C Tamala Julian    Procedure: Intubation (31500)  Indication(s) Respiratory Failure  Consent Risks of the procedure as well as the alternatives and risks of each were explained to the patient and/or caregiver.  Consent for the procedure was obtained and is signed in the bedside chart   Anesthesia Etomidate, Versed, Fentanyl, Rocuronium, and Succinylcholine   Time Out Verified patient identification, verified procedure, site/side was marked, verified correct patient position, special equipment/implants available, medications/allergies/relevant history reviewed, required imaging and test results available.   Sterile Technique Usual hand hygeine, masks, and gloves were used   Procedure Description Patient positioned in bed supine.  Sedation given as noted above.  Patient was intubated with endotracheal tube using Glidescope.  View was Grade 1 full glottis .  Number of attempts was 1.  Colorimetric CO2 detector was consistent with tracheal placement.  Vocal cords appeared normal.   Complications/Tolerance None; patient tolerated the procedure well. Chest X-ray is ordered to verify placement.   EBL Minimal   Specimen(s) None

## 2022-07-28 NOTE — Progress Notes (Signed)
Pt unable to tolerate CPT at this time.

## 2022-07-28 NOTE — Progress Notes (Signed)
NAME:  Caitlin Stevenson, MRN:  [id number], DOB:  July 26, 1951, LOS: 3 ADMISSION DATE:  07/25/2022 CONSULTATION DATE:  07/26/2022 REFERRING MD:  Sabino Gasser - TRH, CHIEF COMPLAINT:  Atelectasis, hypoxia   History of Present Illness:  71 year old female with asthma who presented to [facility] with shortness of breath and cough and found to be hypoxemic to 80s.   She is followed at [facility] and recently seen on 07/20/22. Neg for COVID, flu, RSV however family member recently +RSV. Tried leftover azithromycin without relief. She was treated with outpatient prednisone taper, cough syrup and albuterol.   In the ED CXR with RUL collapse confirmed on CT chest including in left lower lobes. Repeat COVID, influenza neg. Started on 3L [place] for SpO2 85%. PCCM consulted for evaluation for bronchoscopy and symptom management.    Patient reports barking cough with chest congestion, difficult to cough up sputum. Associated with shortness of breath and wheezing. Steroids help but symptoms are persistent. No recent fevers, chills. Never needed oxygen in the past.    On 11/26AM, patient was on 5L O2 with resolved RUL atelectasis after pulmonary toilet however this afternoon she developed worsening hypoxemia even on NRB.  Pertinent Medical History:   Past Medical History:  Diagnosis Date   Allergy    Arthritis    Chronic back pain    Depression    Diabetes mellitus without complication ([place])    diet controlled   History of carpal tunnel syndrome    Bilateral   Hypertension    Hypothyroidism    Migraine    Obese    Pneumonia    x2   Psoriasis    Sleep apnea    mouth guard   Significant Hospital Events: Including procedures, antibiotic start and stop dates in addition to other pertinent events   11/25 - Admit to [facility] 11/26 - PCCM consulted for hypoxemia; placed on BiPAP, pulmonary toilet 11/27 - Improved hypoxia with sats > 96%, 10L HFNC. Ongoing bronchospasm. Did not tolerate  HHFNC. Tolerating BiPAP intermittently. 11/28 - Bronch with PCCM  Interim History / Subjective:  No significant events overnight Intermittent anxiety/bronchospastic events overnight, resolved with Ativan Slept well, feeling better this morning Sats 95-98% on 10L [place], reduced today Plan for bronch at bedside today  Objective:  Blood pressure 135/61, pulse (!) 56, temperature 98 F (36.7 C), temperature source Oral, resp. rate 17, height '5\' 4"'$  (1.626 m), weight 95.5 kg, SpO2 98 %.    FiO2 (%):  [35 %] 35 %   Intake/Output Summary (Last 24 hours) at 07/28/2022 0715 Last data filed at 07/27/2022 2200 Gross per 24 hour  Intake 100 ml  Output 1900 ml  Net -1800 ml    Filed Weights   07/25/22 1444 07/25/22 2100  Weight: 94.7 kg 95.5 kg   Physical Examination: General: Acutely ill-appearing woman in NAD. Resting comfortably in bed. HEENT: [place]/AT, anicteric sclera, PERRL, dry mucous membranes. Neuro: Awake, oriented x 4. Responds to verbal stimuli. Following commands consistently. Moves all 4 extremities spontaneously. CV: RRR, no m/g/r. PULM: Breathing even and minimally labored on 10L [place]. Lung fields with rhonchi in upper fields R > L, mild expiratory wheeze, slightly improved air movement. GI: Soft, nontender, nondistended. Normoactive bowel sounds. Extremities: No LE edema noted. Skin: Warm/dry, no rashes.  Resolved Hospital Problem List:    Assessment & Plan:  Acute hypoxemic respiratory failure Secondary to viral etiology: Recent RSV exposure, RVP neg this admission Secondary atelectasis with  RUL, LLL lung collapse - improved with conservative management; PCT negative and no evidence of infiltrate on CXR Cough variant asthma ?OSA - CT Chest 11/27 with mucus plugging, shifting atelectasis (new RLL/LLL) - Bedside bronch with washings today, 11/28 to further evaluate - Continue supplemental O2 support - BiPAP PRN, did not tolerate HHFNC 11/27 - Wean O2 for sat >  90% - Bronchodilators as ordered (Brovana/Yupelri, Pulmicort, DuoNeb) - Continue Solumedrol - Pulmonary hygiene, CPT, Mucinex - F/u sputum Cx   HTN - Continue home antihypertensives   DM2 - SSI - CBGs Q4H - Goal CBG 140-180  Hypothyroidism - Continue home Synthroid  Depression Anxiety - Ativan PRN for anxiety   Best Practice: (right click and "Reselect all SmartList Selections" daily)   Per Primary Team  Critical care time: N/A   Rhae Lerner [facility] 07/28/22 7:15 AM  Please see [email or web address] for pager details.  From 7A-7P if no response, please call [phone] After hours, please call ELink [phone]

## 2022-07-29 ENCOUNTER — Telehealth: Payer: Self-pay | Admitting: Pulmonary Disease

## 2022-07-29 ENCOUNTER — Ambulatory Visit: Payer: Medicare Other | Admitting: Pulmonary Disease

## 2022-07-29 DIAGNOSIS — J9601 Acute respiratory failure with hypoxia: Secondary | ICD-10-CM | POA: Diagnosis not present

## 2022-07-29 DIAGNOSIS — I10 Essential (primary) hypertension: Secondary | ICD-10-CM | POA: Diagnosis not present

## 2022-07-29 DIAGNOSIS — T17500A Unspecified foreign body in bronchus causing asphyxiation, initial encounter: Secondary | ICD-10-CM

## 2022-07-29 DIAGNOSIS — E1169 Type 2 diabetes mellitus with other specified complication: Secondary | ICD-10-CM | POA: Diagnosis not present

## 2022-07-29 DIAGNOSIS — T17998A Other foreign object in respiratory tract, part unspecified causing other injury, initial encounter: Secondary | ICD-10-CM | POA: Diagnosis not present

## 2022-07-29 LAB — RESPIRATORY PANEL BY PCR

## 2022-07-29 LAB — CBC WITH DIFFERENTIAL/PLATELET
Abs Immature Granulocytes: 0.08 10*3/uL — ABNORMAL HIGH (ref 0.00–0.07)
Basophils Absolute: 0 10*3/uL (ref 0.0–0.1)
Basophils Relative: 0 %
Eosinophils Absolute: 0 10*3/uL (ref 0.0–0.5)
Eosinophils Relative: 0 %
HCT: 37.7 % (ref 36.0–46.0)
Hemoglobin: 12.2 g/dL (ref 12.0–15.0)
Immature Granulocytes: 1 %
Lymphocytes Relative: 10 %
Lymphs Abs: 1.5 10*3/uL (ref 0.7–4.0)
MCH: 30.8 pg (ref 26.0–34.0)
MCHC: 32.4 g/dL (ref 30.0–36.0)
MCV: 95.2 fL (ref 80.0–100.0)
Monocytes Absolute: 0.9 10*3/uL (ref 0.1–1.0)
Monocytes Relative: 6 %
Neutro Abs: 12.2 10*3/uL — ABNORMAL HIGH (ref 1.7–7.7)
Neutrophils Relative %: 83 %
Platelets: 299 10*3/uL (ref 150–400)
RBC: 3.96 MIL/uL (ref 3.87–5.11)
RDW: 12.7 % (ref 11.5–15.5)
WBC: 14.8 10*3/uL — ABNORMAL HIGH (ref 4.0–10.5)
nRBC: 0 % (ref 0.0–0.2)

## 2022-07-29 LAB — PNEUMOCYSTIS JIROVECI SMEAR BY DFA: Pneumocystis jiroveci Ag: NEGATIVE

## 2022-07-29 LAB — ANCA PROFILE
Anti-MPO Antibodies: <0.2 units (ref 0.0–0.9)
Anti-PR3 Antibodies: <0.2 units (ref 0.0–0.9)
Atypical P-ANCA titer: <1:20 {titer}
C-ANCA: <1:20 {titer}
P-ANCA: <1:20 {titer}

## 2022-07-29 LAB — RHEUMATOID FACTOR: Rheumatoid fact SerPl-aCnc: 12.8 IU/mL (ref <14.0)

## 2022-07-29 LAB — BASIC METABOLIC PANEL
Anion gap: 8 (ref 5–15)
BUN: 24 mg/dL — ABNORMAL HIGH (ref 8–23)
CO2: 27 mmol/L (ref 22–32)
Calcium: 8.4 mg/dL — ABNORMAL LOW (ref 8.9–10.3)
Chloride: 99 mmol/L (ref 98–111)
Creatinine, Ser: 0.64 mg/dL (ref 0.44–1.00)
GFR, Estimated: >60 mL/min (ref >60)
Glucose, Bld: 102 mg/dL — ABNORMAL HIGH (ref 70–99)
Potassium: 4.4 mmol/L (ref 3.5–5.1)
Sodium: 134 mmol/L — ABNORMAL LOW (ref 135–145)

## 2022-07-29 LAB — ANTI-SCLERODERMA ANTIBODY: Scleroderma (Scl-70) (ENA) Antibody, IgG: <0.2 AI (ref 0.0–0.9)

## 2022-07-29 LAB — CYCLIC CITRUL PEPTIDE ANTIBODY, IGG/IGA: CCP Antibodies IgG/IgA: 5 units (ref 0–19)

## 2022-07-29 LAB — MAGNESIUM: Magnesium: 2.3 mg/dL (ref 1.7–2.4)

## 2022-07-29 LAB — GLUCOSE, CAPILLARY
Glucose-Capillary: 92 mg/dL (ref 70–99)
Glucose-Capillary: 112 mg/dL — ABNORMAL HIGH (ref 70–99)

## 2022-07-29 LAB — ANTINUCLEAR ANTIBODIES, IFA: ANA Ab, IFA: NEGATIVE

## 2022-07-29 LAB — C3 COMPLEMENT: C3 Complement: 143 mg/dL (ref 82–167)

## 2022-07-29 LAB — ALDOLASE: Aldolase: 6.7 U/L (ref 3.3–10.3)

## 2022-07-29 LAB — C4 COMPLEMENT: Complement C4, Body Fluid: 25 mg/dL (ref 12–38)

## 2022-07-29 MED ORDER — ORAL CARE MOUTH RINSE: 15.0000 mL | OROMUCOSAL | Status: DC | PRN

## 2022-07-29 MED ORDER — BUDESONIDE 0.5 MG/2ML IN SUSP
0.5000 mg | Freq: Two times a day (BID) | RESPIRATORY_TRACT | Status: DC
  Administered 2022-07-29 – 2022-07-31 (×4): 0.5 mg via RESPIRATORY_TRACT
  Filled 2022-07-29 (×4): qty 2

## 2022-07-29 MED ORDER — CLONAZEPAM 0.5 MG PO TABS: 0.5000 mg | ORAL_TABLET | Freq: Two times a day (BID) | ORAL | Status: DC | PRN

## 2022-07-29 NOTE — Evaluation (Signed)
Physical Therapy Evaluation Patient Details Name: Caitlin Stevenson MRN: [id number] DOB: [birth year] Today's Date: 07/29/2022  History of Present Illness  Caitlin Stevenson is a 71 yo female with PMH asthma (follows with pulmonology), HTN, hypothyroidism, depression, psoriasis, sleep apnea.  Patient presented to [facility] with worsening shortness of breath and cough. Patient admtited with acute respiratory failure.  CXR with RUL collapse confirmed on CT chest including in left lower lobes. Repeat COVID, influenza neg. Patient underwent bronchoscopy for mucous plugging 11/28 , intubated and extubated same day  Clinical Impression  Pt admitted with above diagnosis.  Pt currently with functional limitations due to the deficits listed below (see PT Problem List). Pt will benefit from skilled PT to increase their independence and safety with mobility to allow discharge to the venue listed below.   Pt assisted with ambulating in hallway and tolerated good distance.  Pt reports she was able to produce sputum with coughing during ambulation.  SPO2 mostly 91-92% on 6L O2 during gait however dropped to 86% with coughing.  Pt encouraged to ambulate with staff during acute stay and agreeable.        Recommendations for follow up therapy are one component of a multi-disciplinary discharge planning process, led by the attending physician.  Recommendations may be updated based on patient status, additional functional criteria and insurance authorization.  Follow Up Recommendations Home health PT      Assistance Recommended at Discharge PRN  Patient can return home with the following  Assistance with cooking/housework;Help with stairs or ramp for entrance    Equipment Recommendations None recommended by PT  Recommendations for Other Services       Functional Status Assessment       Precautions / Restrictions Precautions Precautions: Fall Precaution Comments: monitor O2 sats Restrictions Weight Bearing  Restrictions: No      Mobility  Bed Mobility               General bed mobility comments: up in chair    Transfers Overall transfer level: Needs assistance Equipment used: None Transfers: Sit to/from Stand Sit to Stand: Supervision           General transfer comment: pt donned depends and pads for ambulating (coughing)    Ambulation/Gait Ambulation/Gait assistance: Supervision, Min guard Gait Distance (Feet): 350 Feet Assistive device: 1 person hand held assist Gait Pattern/deviations: Step-through pattern, Decreased stride length       General Gait Details: provided HHA for a litte more stability, a few rest breaks for breathing and coughing required; ambulated on 6L O2 [place]  Stairs            Wheelchair Mobility    Modified Rankin (Stroke Patients Only)       Balance Overall balance assessment: No apparent balance deficits (not formally assessed)                                           Pertinent Vitals/Pain Pain Assessment Pain Assessment: No/denies pain    Home Living Family/patient expects to be discharged to:: Private residence Living Arrangements: Alone Available Help at Discharge: Friend(s);Available PRN/intermittently Type of Home: House Home Access: Stairs to enter Entrance Stairs-Rails: Can reach both;Left;Right Entrance Stairs-Number of Steps: 4   Home Layout: One level Home Equipment: Conservation officer, nature (2 wheels)      Prior Function Prior Level of Function : Independent/Modified Independent  Hand Dominance   Dominant Hand: Left    Extremity/Trunk Assessment   Upper Extremity Assessment Upper Extremity Assessment: Overall WFL for tasks assessed    Lower Extremity Assessment Lower Extremity Assessment: Overall WFL for tasks assessed    Cervical / Trunk Assessment Cervical / Trunk Assessment: Normal  Communication   Communication: No difficulties  Cognition  Arousal/Alertness: Awake/alert Behavior During Therapy: WFL for tasks assessed/performed Overall Cognitive Status: Within Functional Limits for tasks assessed                                          General Comments      Exercises     Assessment/Plan    PT Assessment Patient needs continued PT services  PT Problem List Cardiopulmonary status limiting activity;Decreased activity tolerance;Decreased knowledge of use of DME       PT Treatment Interventions Gait training;DME instruction;Therapeutic exercise;Balance training;Functional mobility training;Therapeutic activities;Patient/family education;Stair training    PT Goals (Current goals can be found in the Care Plan section)  Acute Rehab PT Goals PT Goal Formulation: With patient Time For Goal Achievement: 08/13/22 Potential to Achieve Goals: Good    Frequency Min 3X/week     Co-evaluation               AM-PAC PT "6 Clicks" Mobility  Outcome Measure Help needed turning from your back to your side while in a flat bed without using bedrails?: A Little Help needed moving from lying on your back to sitting on the side of a flat bed without using bedrails?: A Little Help needed moving to and from a bed to a chair (including a wheelchair)?: A Little Help needed standing up from a chair using your arms (e.g., wheelchair or bedside chair)?: A Little Help needed to walk in hospital room?: A Little Help needed climbing 3-5 steps with a railing? : A Little 6 Click Score: 18    End of Session Equipment Utilized During Treatment: Gait belt;Oxygen Activity Tolerance: Patient tolerated treatment well Patient left: in chair;with call bell/phone within reach (RT for breathing tx)   PT Visit Diagnosis: Difficulty in walking, not elsewhere classified (R26.2)    Time: 8099-8338 PT Time Calculation (min) (ACUTE ONLY): 18 min   Charges:   PT Evaluation $PT Eval Low Complexity: 1 Low         Kati PT,  DPT Physical Therapist Acute Rehabilitation Services Preferred contact method: Secure Chat Weekend Pager Only: [phone] Office: [place] 07/29/2022, 3:54 PM

## 2022-07-29 NOTE — Progress Notes (Signed)
PROGRESS NOTE    Caitlin Stevenson  HKF:[id number] DOB: May 01, 1951 DOA: 07/25/2022 PCP: Reynold Bowen, MD   Brief Narrative:  Caitlin Stevenson is a 71 yo female with PMH asthma (follows with pulmonology), HTN, hypothyroidism, depression, psoriasis, sleep apnea. Patient presented to [facility] with worsening shortness of breath and cough.  She was found to be in the 80s on room air on presentation. She was recently seen outpatient by pulmonology on 07/20/2022 and underwent viral testing which was negative for COVID, flu, RSV.  She did have exposure to a grandchild with RSV however testing for the patient was negative on 11/20.    She has been on a prednisone taper outpatient along with hycodan and albuterol for symptom management at home.  CXR obtained showed concern for right upper lobe collapse.  She then underwent CT chest which showed "near complete collapse of the right upper and left lower lobes". Concern was for underlying mucous plugging.   Repeat Covid and Flu testing at [facility] again were negative.   She was requiring salter HF 8 L on workup to maintain adequate oxygenation and was transferred to [facility] for further treatment and workup. Repeat RVP testing on admission was also negative.  Pulmonology was consulted.   **Interim History Patient underwent bronchoscopy for mucous plugging and is improved but continues to still have some rhonchi.  Pulmonary recommending working on her mobility and oxygen weaning as well as getting a flutter valve.  They do not know why she ended up like this however recommending changing steroids to prednisone 40 mg a day for 5 days.  She will need an amatory home O2 screen prior to discharge and pulmonary recommends continue Brovana, Pulmicort and DuoNebs for now.  They are recommending following up on the tracheal aspirate and recommending continuing mucolytic's and repeating chest x-ray in the a.m.  Assessment and Plan: * Acute hypoxemic respiratory failure  (HCC) -CXR and CT chest consistent with mucous plugging accounting for etiology -SpO2: 98 % O2 Flow Rate (L/min): 5 L/min FiO2 (%): 50 % (found on 50%) -See mucous plugging -Continue weaning as able.  Not on oxygen at home -Patient will need ambulatory home O2 screen prior to discharge and repeat chest x-ray in the morning -Home very following and appreciate further evaluation and they are recommending continuing Brovana, Pulmicort and DuoNebs as well as aggressive pulmonary hygiene -Pulmonary recommends continue mobilization as well as continue steroids and transition to oral prednisone for 5 days -Patient will follow-up with pulmonary after discharge  Mucus plug in respiratory tract -CXT and CT chest consistent with what appears to be mucous plugging in setting of URI -responded to CPT as evidenced by improvement in CXR  -Pulmonology consulted for further assistance also -Continue current treatment; pulmonology has added further treatment on as well recommending weaning off oxygen to off for sats greater than 90% -PCT negative; WBC considered due to steroid use; afebrile. At this time d/c abx and monitor clinically  -Bronchoscopy done and PGP negative on FOB and will follow-up on tracheal aspirate and culture -Continue with aggressive pulmonary hygiene, mobilization, Brovana, Pulmicort and DuoNebs as well as steroids with Solu-Medrol -Pulmonary recommends continue antitussives with guaifenesin and Hycodan -CT chest showed mucous plugging with shifting atelectasis and is improved with conservative management and she required intubation for a fiberoptic bronchoscopy on 07/28/2002. -Repeat chest x-ray in the a.m.  Essential Hypertension -Continue Benicar-HCTZ -Continue to Monitor BP per Protocol  -Last BP reading was 119/85  Hyperlipidemia - No statin noted  on home med rec  Diabetes mellitus type 2 in obese ([place]) -HbA1c 5.8% -Continue with Moderate Novolog SSI and CBG  monitoring -CBGs ranging from 97-152 and was 102 on this AM's BMP   Hypothyroidism -Continue Levothyroxine 137 mcg daily. -Check TSH in the AM   Depression and Anxiety  -C/w Citalopram 40 mg po qHS  Normocytic Anemia -Patient's Hgb/Hct went from 11.9/36.1 -> 12.2/37.7 -Check Anemia Panel in the AM -Continue to Monitor for S/Sx of Bleeding; No overt bleeding noted -Repeat CBC in the AM   Hyponatremia -Mild. Na+ went from 135 -> 134 -Continue to Monitor and Trend and repeat CMP in the AM  Leukocytosis -Likely reactive in the setting of steroid use as she is on Methylprednisolone IV 40 mg Daily  -Patient's WBC went from 14.2 -> 14.8 -Continue to Monitor and Trend and repeat CBC in the AM   Obesity -Complicates overall prognosis and care -Estimated body mass index is 36.14 kg/m as calculated from the following:   Height as of this encounter: _0  (1.626 m).   Weight as of this encounter: 95.5 kg.  -Weight Loss and Dietary Counseling given  DVT prophylaxis: SCDs Start: 07/25/22 2211    Code Status: Full Code Family Communication: Discussed with Daughter at bedside   Disposition Plan:  Level of care: Stepdown Status is: Inpatient Remains inpatient appropriate because: Needs further clinical improvement and clearance by pulmonary.  Will need to do an ambulatory home O2 screen prior to discharge   Consultants:  Pulmonary/PCCM  Procedures:  Procedure(s):  Flexible bronchoscopy with bronchial alveolar lavage (02585) and Initial Therapeutic Aspiration of Tracheobronchial Tree (27782) done by Dr. Erskine Emery  Antimicrobials:  Anti-infectives (From admission, onward)    Start     Dose/Rate Route Frequency Ordered Stop   07/26/22 1700  cefTRIAXone (ROCEPHIN) 1 g in sodium chloride 0.9 % 100 mL IVPB  Status:  Discontinued        1 g 200 mL/hr over 30 Minutes Intravenous Every 24 hours 07/25/22 2211 07/26/22 0942   07/26/22 1700  azithromycin (ZITHROMAX) tablet 500 mg  Status:   Discontinued        500 mg Oral Daily 07/25/22 2209 07/26/22 0942   07/25/22 1700  cefTRIAXone (ROCEPHIN) 1 g in sodium chloride 0.9 % 100 mL IVPB        1 g 200 mL/hr over 30 Minutes Intravenous  Once 07/25/22 1656 07/25/22 1729   07/25/22 1700  azithromycin (ZITHROMAX) 500 mg in sodium chloride 0.9 % 250 mL IVPB        500 mg 250 mL/hr over 60 Minutes Intravenous  Once 07/25/22 1656 07/25/22 1856       Subjective: Seen and examined at bedside and she is doing a little bit better but still remains on quite a bit of oxygen.  Denies any nausea or vomiting.  No lightheadedness or dizziness.  Still feels a little short of breath.  No other concerns or complaints at this time  Objective: Vitals:   07/29/22 0720 07/29/22 0744 07/29/22 0745 07/29/22 0746  BP:      Pulse:      Resp:      Temp: 98.1 F (36.7 C)     TempSrc: Oral     SpO2:  99% 98% 97%  Weight:      Height:        Intake/Output Summary (Last 24 hours) at 07/29/2022 0809 Last data filed at 07/29/2022 0600 Gross per 24 hour  Intake 16.37 ml  Output 400 ml  Net -383.63 ml   Filed Weights   07/25/22 1444 07/25/22 2100  Weight: 94.7 kg 95.5 kg   Examination: Physical Exam:  Constitutional: WN/WD obese Caucasian female currently in no acute distress Respiratory: Diminished to auscultation bilaterally with coarse breath sounds worse on the right compared to left and does have some rhonchi and some slight wheezing but no appreciable rales or crackles. Normal respiratory effort and patient is not tachypenic. No accessory muscle use.  She is wearing supplemental oxygen via nasal cannula Cardiovascular: RRR, no murmurs / rubs / gallops. S1 and S2 auscultated. No extremity edema.  Abdomen: Soft, non-tender, distended secondary to body habitus. Bowel sounds positive.  GU: Deferred. Musculoskeletal: No clubbing / cyanosis of digits/nails. No joint deformity upper and lower extremities. Skin: No rashes, lesions, ulcers  limited skin evaluation with. No induration; Warm and dry.  Neurologic: CN 2-12 grossly intact with no focal deficits. Romberg sign and cerebellar reflexes not assessed.  Psychiatric: Normal judgment and insight. Alert and oriented x 3. Normal mood and appropriate affect.   Data Reviewed: I have personally reviewed following labs and imaging studies  CBC: Recent Labs  Lab 07/25/22 1458 07/26/22 0242 07/27/22 0242 07/28/22 0242 07/29/22 0249  WBC 13.6* 12.5* 14.5* 14.2* 14.8*  NEUTROABS 11.8* 10.1* 12.1* 11.5* 12.2*  HGB 12.9 12.0 12.3 11.9* 12.2  HCT 37.3 35.7* 37.8 36.1 37.7  MCV 90.5 92.2 95.5 94.3 95.2  PLT 316 314 323 312 440   Basic Metabolic Panel: Recent Labs  Lab 07/25/22 1458 07/26/22 0242 07/27/22 0242 07/28/22 0242 07/29/22 0249  NA 134* 138 138 135 134*  K 3.6 3.9 4.1 4.0 4.4  CL 100 102 102 99 99  CO2 _0 GLUCOSE 104* 145* 116* 109* 102*  BUN _1 25* 24*  CREATININE 0.77 0.71 0.64 0.77 0.64  CALCIUM 8.7* 8.8* 8.7* 8.5* 8.4*  MG  --  2.3 2.3 2.3 2.3   GFR: Estimated Creatinine Clearance: 71.2 mL/min (by C-G formula based on SCr of 0.64 mg/dL). Liver Function Tests: Recent Labs  Lab 07/26/22 0242  AST 26  ALT 22  ALKPHOS 54  BILITOT 0.4  PROT 6.2*  ALBUMIN 3.5   No results for input(s): "LIPASE", "AMYLASE" in the last 168 hours. No results for input(s): "AMMONIA" in the last 168 hours. Coagulation Profile: No results for input(s): "INR", "PROTIME" in the last 168 hours. Cardiac Enzymes: Recent Labs  Lab 07/28/22 0242  CKTOTAL 162   BNP (last 3 results) No results for input(s): "PROBNP" in the last 8760 hours. HbA1C: No results for input(s): "HGBA1C" in the last 72 hours. CBG: Recent Labs  Lab 07/27/22 2157 07/28/22 0829 07/28/22 1202 07/28/22 1644 07/28/22 2132  GLUCAP 133* 100* 97 152* 137*   Lipid Profile: No results for input(s): "CHOL", "HDL", "LDLCALC", "TRIG", "CHOLHDL", "LDLDIRECT" in the last 72  hours. Thyroid Function Tests: No results for input(s): "TSH", "T4TOTAL", "FREET4", "T3FREE", "THYROIDAB" in the last 72 hours. Anemia Panel: Recent Labs    07/28/22 0242  FERRITIN 147   Sepsis Labs: Recent Labs  Lab 07/26/22 0242 07/27/22 0242 07/28/22 0242  PROCALCITON <0.10 <0.10 <0.10    Recent Results (from the past 240 hour(s))  COVID-19, Flu A+B and RSV     Status: None   Collection Time: 07/20/22  2:13 PM   Specimen: Nasopharyngeal(NP) swabs in vial transport medium   Nasopharynge  Previous  Result Value Ref Range Status   SARS-CoV-2, NAA  Not Detected Not Detected Final   Influenza A, NAA Not Detected Not Detected Final   Influenza B, NAA Not Detected Not Detected Final   RSV, NAA Not Detected Not Detected Final   Test Information: Comment  Final    Comment: This nucleic acid amplification test was developed and its performance characteristics determined by [employer]. Nucleic acid amplification tests include RT-PCR and TMA. This test has not been FDA cleared or approved. This test has been authorized by FDA under an Emergency Use Authorization (EUA). This test is only authorized for the duration of time the declaration that circumstances exist justifying the authorization of the emergency use of in vitro diagnostic tests for detection of SARS-CoV-2 virus and/or diagnosis of COVID-19 infection under section 564(b)(1) of the Act, 21 U.S.C. 846KZL-9(J) (1), unless the authorization is terminated or revoked sooner. When diagnostic testing is negative, the possibility of a false negative result should be considered in the context of a patient's recent exposures and the presence of clinical signs and symptoms consistent with COVID-19. An individual without symptoms of COVID-19 and who is not shedding SARS-CoV-2 virus wo uld expect to have a negative (not detected) result in this assay.   Resp Panel by RT-PCR (Flu A&B, Covid) Anterior Nasal Swab     Status:  None   Collection Time: 07/25/22  2:58 PM   Specimen: Anterior Nasal Swab  Result Value Ref Range Status   SARS Coronavirus 2 by RT PCR NEGATIVE NEGATIVE Final    Comment: (NOTE) SARS-CoV-2 target nucleic acids are NOT DETECTED.  The SARS-CoV-2 RNA is generally detectable in upper respiratory specimens during the acute phase of infection. The lowest concentration of SARS-CoV-2 viral copies this assay can detect is 138 copies/mL. A negative result does not preclude SARS-Cov-2 infection and should not be used as the sole basis for treatment or other patient management decisions. A negative result may occur with  improper specimen collection/handling, submission of specimen other than nasopharyngeal swab, presence of viral mutation(s) within the areas targeted by this assay, and inadequate number of viral copies(<138 copies/mL). A negative result must be combined with clinical observations, patient history, and epidemiological information. The expected result is Negative.  Fact Sheet for Patients:  EntrepreneurPulse.com.au  Fact Sheet for Healthcare Providers:  IncredibleEmployment.be  This test is no t yet approved or cleared by the [place] FDA and  has been authorized for detection and/or diagnosis of SARS-CoV-2 by FDA under an Emergency Use Authorization (EUA). This EUA will remain  in effect (meaning this test can be used) for the duration of the COVID-19 declaration under Section 564(b)(1) of the Act, 21 U.S.C.section 360bbb-3(b)(1), unless the authorization is terminated  or revoked sooner.       Influenza A by PCR NEGATIVE NEGATIVE Final   Influenza B by PCR NEGATIVE NEGATIVE Final    Comment: (NOTE) The Xpert Xpress SARS-CoV-2/FLU/RSV plus assay is intended as an aid in the diagnosis of influenza from Nasopharyngeal swab specimens and should not be used as a sole basis for treatment. Nasal washings and aspirates are unacceptable  for Xpert Xpress SARS-CoV-2/FLU/RSV testing.  Fact Sheet for Patients: EntrepreneurPulse.com.au  Fact Sheet for Healthcare Providers: IncredibleEmployment.be  This test is not yet approved or cleared by the [place] FDA and has been authorized for detection and/or diagnosis of SARS-CoV-2 by FDA under an Emergency Use Authorization (EUA). This EUA will remain in effect (meaning this test can be used) for the duration of the COVID-19 declaration under Section 564(b)(1)  of the Act, 21 U.S.C. section 360bbb-3(b)(1), unless the authorization is terminated or revoked.  Performed at [facility], [address] [place], [place] [address]   MRSA Next Gen by PCR, Nasal     Status: None   Collection Time: 07/25/22  8:52 PM   Specimen: Nasal Mucosa; Nasal Swab  Result Value Ref Range Status   MRSA by PCR Next Gen NOT DETECTED NOT DETECTED Final    Comment: (NOTE) The GeneXpert MRSA Assay (FDA approved for NASAL specimens only), is one component of a comprehensive MRSA colonization surveillance program. It is not intended to diagnose MRSA infection nor to guide or monitor treatment for MRSA infections. Test performance is not FDA approved in patients less than 25 years old. Performed at [facility], [place] [address], [place], [place] [address]   Respiratory (~20 pathogens) panel by PCR     Status: None   Collection Time: 07/25/22 10:10 PM   Specimen: Nasopharyngeal Swab; Respiratory  Result Value Ref Range Status   Adenovirus NOT DETECTED NOT DETECTED Final   Coronavirus 229E NOT DETECTED NOT DETECTED Final    Comment: (NOTE) The Coronavirus on the Respiratory Panel, DOES NOT test for the novel  Coronavirus (2019 nCoV)    Coronavirus HKU1 NOT DETECTED NOT DETECTED Final   Coronavirus NL63 NOT DETECTED NOT DETECTED Final   Coronavirus OC43 NOT DETECTED NOT DETECTED Final   Metapneumovirus NOT DETECTED NOT DETECTED  Final   Rhinovirus / Enterovirus NOT DETECTED NOT DETECTED Final   Influenza A NOT DETECTED NOT DETECTED Final   Influenza B NOT DETECTED NOT DETECTED Final   Parainfluenza Virus 1 NOT DETECTED NOT DETECTED Final   Parainfluenza Virus 2 NOT DETECTED NOT DETECTED Final   Parainfluenza Virus 3 NOT DETECTED NOT DETECTED Final   Parainfluenza Virus 4 NOT DETECTED NOT DETECTED Final   Respiratory Syncytial Virus NOT DETECTED NOT DETECTED Final   Bordetella pertussis NOT DETECTED NOT DETECTED Final   Bordetella Parapertussis NOT DETECTED NOT DETECTED Final   Chlamydophila pneumoniae NOT DETECTED NOT DETECTED Final   Mycoplasma pneumoniae NOT DETECTED NOT DETECTED Final    Comment: Performed at [facility], [place]. [address], [place], [place] [address]  Expectorated Sputum Assessment w Gram Stain, Rflx to Resp Cult     Status: None   Collection Time: 07/27/22  2:28 PM   Specimen: Sputum  Result Value Ref Range Status   Specimen Description SPUTUM  Final   Special Requests NONE  Final   Sputum evaluation   Final    THIS SPECIMEN IS ACCEPTABLE FOR SPUTUM CULTURE Performed at [facility], [place] [address] [place], [place] [address]    Report Status 07/27/2022 FINAL  Final  Culture, Respiratory w Gram Stain     Status: None (Preliminary result)   Collection Time: 07/27/22  2:28 PM   Specimen: SPU  Result Value Ref Range Status   Specimen Description   Final    SPUTUM Performed at [place] [address], [place], [place] [address]    Special Requests   Final    NONE Reflexed from [id number] Performed at [facility], [place] [address], [place], [place] [address]    Gram Stain   Final    RARE SQUAMOUS EPITHELIAL CELLS PRESENT RARE WBC PRESENT,BOTH PMN AND MONONUCLEAR MODERATE GRAM POSITIVE COCCI IN CHAINS MODERATE GRAM POSITIVE COCCI IN CLUSTERS    Culture   Final    CULTURE  REINCUBATED FOR BETTER GROWTH Performed at [facility], [place] [address], [place], [place] [address]    Report Status PENDING  Incomplete  Respiratory (~20 pathogens) panel by PCR     Status: None   Collection Time: 07/28/22  9:59 AM   Specimen: Nasopharyngeal Swab; Respiratory  Result Value Ref Range Status   Adenovirus NOT DETECTED NOT DETECTED Final   Coronavirus 229E NOT DETECTED NOT DETECTED Final    Comment: (NOTE) The Coronavirus on the Respiratory Panel, DOES NOT test for the novel  Coronavirus (2019 nCoV)    Coronavirus HKU1 NOT DETECTED NOT DETECTED Final   Coronavirus NL63 NOT DETECTED NOT DETECTED Final   Coronavirus OC43 NOT DETECTED NOT DETECTED Final   Metapneumovirus NOT DETECTED NOT DETECTED Final   Rhinovirus / Enterovirus NOT DETECTED NOT DETECTED Final   Influenza A NOT DETECTED NOT DETECTED Final   Influenza B NOT DETECTED NOT DETECTED Final   Parainfluenza Virus 1 NOT DETECTED NOT DETECTED Final   Parainfluenza Virus 2 NOT DETECTED NOT DETECTED Final   Parainfluenza Virus 3 NOT DETECTED NOT DETECTED Final   Parainfluenza Virus 4 NOT DETECTED NOT DETECTED Final   Respiratory Syncytial Virus NOT DETECTED NOT DETECTED Final   Bordetella pertussis NOT DETECTED NOT DETECTED Final   Bordetella Parapertussis NOT DETECTED NOT DETECTED Final   Chlamydophila pneumoniae NOT DETECTED NOT DETECTED Final   Mycoplasma pneumoniae NOT DETECTED NOT DETECTED Final    Comment: Performed at [facility], [place]. [address], [place], [place] [address]  Culture, Respiratory w Gram Stain     Status: None (Preliminary result)   Collection Time: 07/28/22  1:40 PM   Specimen: Bronchoalveolar Lavage; Respiratory  Result Value Ref Range Status   Specimen Description   Final    BRONCHIAL ALVEOLAR LAVAGE Performed at [place] [address], [place], [place] [address]    Special Requests   Final    NONE Performed at [facility], [place] [address] [place], [place] [address]    Gram  Stain   Final    FEW WBC PRESENT, PREDOMINANTLY MONONUCLEAR NO ORGANISMS SEEN Performed at [facility], [place] [address], [place], [place] [address]    Culture PENDING  Incomplete   Report Status PENDING  Incomplete     Radiology Studies: CT CHEST WO CONTRAST  Result Date: 07/27/2022 CLINICAL DATA:  Pneumonia complication suspected; x-ray done EXAM: CT CHEST WITHOUT CONTRAST TECHNIQUE: Multidetector CT imaging of the chest was performed following the standard protocol without IV contrast. RADIATION DOSE REDUCTION: This exam was performed according to the departmental dose-optimization program which includes automated exposure control, adjustment of the mA and/or kV according to patient size and/or use of iterative reconstruction technique. COMPARISON:  Radiographs 07/26/2022 and CT 07/25/2022 FINDINGS: Cardiovascular: Normal heart size. No pericardial effusion. Coronary artery and aortic atherosclerotic calcification. Mediastinum/Nodes: No definite mediastinal or hilar adenopathy on noncontrast exam. No axillary adenopathy. Unremarkable esophagus. Lungs/Pleura: Since 07/25/2022, there is improved aeration of the right upper lobe with residual ground-glass opacities and interlobular septal thickening within the right upper lobe posteriorly. Decreased mucous plugging in the right upper lobe bronchi. New multiple mucous plugs in the right lower lobe bronchi with new atelectasis of the inferior right lower lobe. Mucous plugging in the left lower lobe with near-complete collapse of the left lower lobe is similar to slightly improved from 07/25/2022. New linear atelectasis in the lingula. No pleural effusion or pneumothorax. Upper Abdomen: No acute  abnormality. Musculoskeletal: No chest wall mass or suspicious bone lesions identified. IMPRESSION: Mucous plugging with shifting atelectasis. There is new atelectasis of the right lower lobe with similar atelectasis of the left lower lobe and improved  atelectasis of the right upper lobe. Postobstructive pneumonia is difficult to exclude. No definite obstructing mass though follow-up after treatment is again recommended in 4-6 weeks. Aortic Atherosclerosis (ICD10-I70.0). Electronically Signed   By: Placido Sou M.D.   On: 07/27/2022 22:43    Scheduled Meds:  arformoterol  15 mcg Nebulization BID   budesonide (PULMICORT) nebulizer solution  0.25 mg Nebulization BID   Chlorhexidine Gluconate Cloth  6 each Topical Daily   citalopram  40 mg Oral QHS   guaiFENesin  600 mg Oral BID   irbesartan  150 mg Oral Daily   And   hydrochlorothiazide  12.5 mg Oral Daily   hydrOXYzine  25 mg Oral QHS   insulin aspart  0-15 Units Subcutaneous TID WC   insulin aspart  0-5 Units Subcutaneous QHS   ipratropium-albuterol  3 mL Nebulization Q6H   levothyroxine  137 mcg Oral Q0600   methylPREDNISolone (SOLU-MEDROL) injection  40 mg Intravenous Daily   Continuous Infusions:  propofol (DIPRIVAN) infusion Stopped (07/28/22 1504)    LOS: 4 days   Raiford Noble, DO [facility] Available via Epic secure chat 7am-7pm After these hours, please refer to coverage provider listed on [email or web address] 07/29/2022, 8:09 AM

## 2022-07-29 NOTE — Evaluation (Signed)
Occupational Therapy Evaluation Patient Details Name: Caitlin Stevenson MRN: [id number] DOB: 1950-12-27 Today's Date: 07/29/2022   History of Present Illness Caitlin Stevenson is a 71 yo female with PMH asthma (follows with pulmonology), HTN, hypothyroidism, depression, psoriasis, sleep apnea.  Patient presented to [facility] with worsening shortness of breath and cough. Patient admtited with acute respiratory failure.  CXR with RUL collapse confirmed on CT chest including in left lower lobes. Repeat COVID, influenza neg. Patient underwent bronchoscopy for mucous plugging 11/28 , intubated and extubated same day   Clinical Impression   Caitlin Stevenson is a 71 year old woman, typically independent, who presents now on 6 L HFNC with a persistent cough. On evaluation she demonstrates functional upper body strength and ability to perform ADLS from predominantly seated surface. She was able to ambulate in room but limited by coughing and incontinence. She dropped to 83% on 6 L HFNC but recovered with seated rest break. Do not expect patient will need OT services at discharge. Did recommend shower chair and discussed some energy conservation strategies. Patient will benefit from skilled OT services while in hospital to improve deficits and learn compensatory strategies as needed in order to return to PLOF.        Recommendations for follow up therapy are one component of a multi-disciplinary discharge planning process, led by the attending physician.  Recommendations may be updated based on patient status, additional functional criteria and insurance authorization.   Follow Up Recommendations  No OT follow up     Assistance Recommended at Discharge PRN  Patient can return home with the following Assistance with cooking/housework;Help with stairs or ramp for entrance    Functional Status Assessment  Patient has had a recent decline in their functional status and demonstrates the ability to make significant  improvements in function in a reasonable and predictable amount of time.  Equipment Recommendations  Tub/shower seat    Recommendations for Other Services       Precautions / Restrictions Precautions Precaution Comments: monitor o2 sats Restrictions Weight Bearing Restrictions: No      Mobility Bed Mobility               General bed mobility comments: up in chair    Transfers Overall transfer level: Needs assistance Equipment used: None               General transfer comment: Patient ambulated in room holding onto therapist x 14 feet on 6 L [place].      Balance Overall balance assessment: No apparent balance deficits (not formally assessed)                                         ADL either performed or assessed with clinical judgement   ADL Overall ADL's : Needs assistance/impaired Eating/Feeding: Independent   Grooming: Set up;Sitting   Upper Body Bathing: Set up;Sitting   Lower Body Bathing: Minimal assistance;Sit to/from stand   Upper Body Dressing : Set up;Sitting   Lower Body Dressing: Set up;Sit to/from stand Lower Body Dressing Details (indicate cue type and reason): can reach feet using figure four method Toilet Transfer: Min guard;BSC/3in1   Writer and Hygiene: Min guard;Sit to/from stand       Functional mobility during ADLs: Min guard;Rolling walker (2 wheels)       Vision Patient Visual Report: No change from baseline  Perception     Praxis      Pertinent Vitals/Pain Pain Assessment Pain Assessment: No/denies pain     Hand Dominance Left   Extremity/Trunk Assessment Upper Extremity Assessment Upper Extremity Assessment: Overall WFL for tasks assessed   Lower Extremity Assessment Lower Extremity Assessment: Defer to PT evaluation   Cervical / Trunk Assessment Cervical / Trunk Assessment: Normal   Communication Communication Communication: No difficulties   Cognition  Arousal/Alertness: Awake/alert Behavior During Therapy: WFL for tasks assessed/performed Overall Cognitive Status: Within Functional Limits for tasks assessed                                       General Comments       Exercises     Shoulder Instructions      Home Living Family/patient expects to be discharged to:: Private residence Living Arrangements: Alone Available Help at Discharge: Friend(s);Available PRN/intermittently Type of Home: House Home Access: Stairs to enter [employer] of Steps: 4 Entrance Stairs-Rails: Can reach both;Left;Right Home Layout: One level     Bathroom Shower/Tub: Occupational psychologist: Handicapped height                Prior Functioning/Environment Prior Level of Function : Independent/Modified Independent                        OT Problem List: Decreased activity tolerance;Cardiopulmonary status limiting activity      OT Treatment/Interventions: Self-care/ADL training;Energy conservation;DME and/or AE instruction;Patient/family education;Therapeutic exercise;Therapeutic activities    OT Goals(Current goals can be found in the care plan section) Acute Rehab OT Goals Patient Stated Goal: go home soon OT Goal Formulation: With patient Time For Goal Achievement: 08/12/22 Potential to Achieve Goals: Good  OT Frequency: Min 1X/week    Co-evaluation              AM-PAC OT "6 Clicks" Daily Activity     Outcome Measure Help from another person eating meals?: None Help from another person taking care of personal grooming?: A Little Help from another person toileting, which includes using toliet, bedpan, or urinal?: A Little Help from another person bathing (including washing, rinsing, drying)?: A Little Help from another person to put on and taking off regular upper body clothing?: A Little Help from another person to put on and taking off regular lower body clothing?: A Little 6  Click Score: 19   End of Session Equipment Utilized During Treatment: Oxygen Nurse Communication: Mobility status  Activity Tolerance: Patient tolerated treatment well Patient left: in chair;with call bell/phone within reach  OT Visit Diagnosis: Muscle weakness (generalized) (M62.81)                Time: 0932-3557 OT Time Calculation (min): 18 min Charges:  OT General Charges $OT Visit: 1 Visit OT Evaluation $OT Eval Low Complexity: 1 Low  Gustavo Lah, OTR/L [place]  Office [phone]   Lenward Chancellor 07/29/2022, 2:38 PM

## 2022-07-29 NOTE — Progress Notes (Addendum)
07/29/2022  Seen and examined. Looks/feels better after therapeutic bronch Still R>L rhonci Will work on mobility, O2 wean, and get a flutter valve Still no great explanation for why she had this 40/day of pred on DC x 5 days Walking pulse ox prior to DC Alprazolam or hydroxyzine for anxiety Will arrange f/u in 2 weeks in pulmonary clinic to assure continued improvement and review bronch results  Erskine Emery MD PCCM     NAME:  PAIRLEE SAWTELL, MRN:  [id number], DOB:  [birth year], LOS: 4 ADMISSION DATE:  07/25/2022 CONSULTATION DATE:  07/26/2022 REFERRING MD:  Sabino Gasser - [facility], CHIEF COMPLAINT:  Atelectasis, hypoxia   History of Present Illness:  71 year old female with asthma who presented to [place] ED with shortness of breath and cough and found to be hypoxemic to 80s.   She is followed at [facility] and recently seen on 07/20/22. Neg for COVID, flu, RSV however family member recently +RSV. Tried leftover azithromycin without relief. She was treated with outpatient prednisone taper, cough syrup and albuterol.   In the ED CXR with RUL collapse confirmed on CT chest including in left lower lobes. Repeat COVID, influenza neg. Started on 3L [place] for SpO2 85%. PCCM consulted for evaluation for bronchoscopy and symptom management.    Patient reports barking cough with chest congestion, difficult to cough up sputum. Associated with shortness of breath and wheezing. Steroids help but symptoms are persistent. No recent fevers, chills. Never needed oxygen in the past.    On 11/26 AM, patient was on 5L O2 with resolved RUL atelectasis after pulmonary toilet however this afternoon she developed worsening hypoxemia even on NRB.  Pertinent Medical History:   Past Medical History:  Diagnosis Date   Allergy    Arthritis    Chronic back pain    Depression    Diabetes mellitus without complication ([place])    diet controlled   History of carpal tunnel syndrome    Bilateral    Hypertension    Hypothyroidism    Migraine    Obese    Pneumonia    x2   Psoriasis    Sleep apnea    mouth guard   Significant Hospital Events: Including procedures, antibiotic start and stop dates in addition to other pertinent events   11/25 Admit to [facility] 11/26 PCCM consulted for hypoxemia; placed on BiPAP, pulmonary toilet 11/27 Improved hypoxia with sats > 96%, 10L HFNC. Ongoing bronchospasm. Did not tolerate HHFNC. Tolerating BiPAP intermittently. 11/28 Bronch with PCCM, intubated and extubated same day  Interim History / Subjective:  Afebrile/ WBC 14.8 O2 via [place] 5L. Not on home O2 at baseline BG range 97-152 Pt reports feeling better, hopeful to go home soon.   Objective:  Blood pressure 119/85, pulse 82, temperature 98.1 F (36.7 C), temperature source Oral, resp. rate 18, height '5\' 4"'$  (1.626 m), weight 95.5 kg, SpO2 99 %.    Vent Mode: PRVC FiO2 (%):  [50 %-100 %] 50 % Set Rate:  [20 bmp] 20 bmp Vt Set:  [430 mL] 430 mL PEEP:  [12 cmH20] 12 cmH20 Plateau Pressure:  [23 cmH20] 23 cmH20   Intake/Output Summary (Last 24 hours) at 07/29/2022 0744 Last data filed at 07/29/2022 0600 Gross per 24 hour  Intake 16.37 ml  Output 400 ml  Net -383.63 ml   Filed Weights   07/25/22 1444 07/25/22 2100  Weight: 94.7 kg 95.5 kg   Physical Examination: General: elderly female lying in bed in  NAD, daughter at bedside   HEENT: MM pink/moist, anicteric, pupils =/reactive  Neuro: AAOx4, speech clear, MAE CV: s1s2 RRR, no m/r/g PULM: non-labored at rest, lungs bilaterally with wheezing R>L. Remains on 5L  GI: soft, bsx4 active  Extremities: warm/dry, no edema  Skin: no rashes or lesions   Resolved Hospital Problem List:    Assessment & Plan:   Acute hypoxemic respiratory failure Secondary to viral etiology and atelectasis with RUL, LLL lung collapse  Cough variant asthma ?OSA Recent RSV exposure, RVP neg this admission CT Chest 11/27 with mucus plugging, shifting  atelectasis (new RLL/LLL) that improved with conservative management. Required intubation / FOB 11/28.  -wean O2 to off for sats >90% -pulmonary hygiene - suspect large degree of shunt contributing to O2 needs currently -mobilize -continue brovana, pulmicort, duoneb  -Solumedrol 40 mg IV QD -PJP negative on FOB. F/u tracheal aspirate culture   -continue guaifenesin, hycodan -add flutter Q2, should continue after discharge  -will need pulmonary follow up after discharge  -intermittent CXR    HTN -continue home HCTZ, irbesartan -per [facility]    DM2 -per [facility]   Hypothyroidism -per [facility]  Depression Anxiety -per [facility]    Best Practice: (right click and "Reselect all SmartList Selections" daily)   Per Primary Team  Critical care time:    [name], MSN, APRN, NP-C, AGACNP-BC  Pulmonary & Critical Care 07/29/2022, 10:03 AM   Please see [email or web address] for pager details.   From 7A-7P if no response, please call [phone] After hours, please call ELink [phone]

## 2022-07-29 NOTE — Telephone Encounter (Signed)
Patient admitted to hospital, follows with Dr. Silas Flood.  She will need hospital follow up for two weeks / mid December with NP or Dr. Silas Flood.  Anticipated discharge on 11/30. Please notify patient of appt.     [name], MSN, APRN, NP-C, AGACNP-BC  Pulmonary & Critical Care 07/29/2022, 10:06 AM   Please see [email or web address] for pager details.   From 7A-7P if Caitlin response, please call [phone] After hours, please call ELink [phone]

## 2022-07-30 ENCOUNTER — Inpatient Hospital Stay (HOSPITAL_COMMUNITY): Payer: Medicare Other

## 2022-07-30 DIAGNOSIS — I10 Essential (primary) hypertension: Secondary | ICD-10-CM | POA: Diagnosis not present

## 2022-07-30 DIAGNOSIS — T17998A Other foreign object in respiratory tract, part unspecified causing other injury, initial encounter: Secondary | ICD-10-CM | POA: Diagnosis not present

## 2022-07-30 DIAGNOSIS — J9601 Acute respiratory failure with hypoxia: Secondary | ICD-10-CM | POA: Diagnosis not present

## 2022-07-30 DIAGNOSIS — E1169 Type 2 diabetes mellitus with other specified complication: Secondary | ICD-10-CM | POA: Diagnosis not present

## 2022-07-30 LAB — GLUCOSE, CAPILLARY
Glucose-Capillary: 134 mg/dL — ABNORMAL HIGH (ref 70–99)
Glucose-Capillary: 121 mg/dL — ABNORMAL HIGH (ref 70–99)
Glucose-Capillary: 103 mg/dL — ABNORMAL HIGH (ref 70–99)
Glucose-Capillary: 114 mg/dL — ABNORMAL HIGH (ref 70–99)
Glucose-Capillary: 130 mg/dL — ABNORMAL HIGH (ref 70–99)
Glucose-Capillary: 118 mg/dL — ABNORMAL HIGH (ref 70–99)
Glucose-Capillary: 195 mg/dL — ABNORMAL HIGH (ref 70–99)

## 2022-07-30 LAB — RETICULOCYTES
Immature Retic Fract: 11.2 % (ref 2.3–15.9)
RBC.: 4.02 MIL/uL (ref 3.87–5.11)
Retic Count, Absolute: 111 10*3/uL (ref 19.0–186.0)
Retic Ct Pct: 2.8 % (ref 0.4–3.1)

## 2022-07-30 LAB — CBC WITH DIFFERENTIAL/PLATELET
Abs Immature Granulocytes: 0.2 10*3/uL — ABNORMAL HIGH (ref 0.00–0.07)
Basophils Absolute: 0 10*3/uL (ref 0.0–0.1)
Basophils Relative: 0 %
Eosinophils Absolute: 0 10*3/uL (ref 0.0–0.5)
Eosinophils Relative: 0 %
HCT: 37.7 % (ref 36.0–46.0)
Hemoglobin: 12.5 g/dL (ref 12.0–15.0)
Immature Granulocytes: 2 %
Lymphocytes Relative: 16 %
Lymphs Abs: 1.9 10*3/uL (ref 0.7–4.0)
MCH: 31.3 pg (ref 26.0–34.0)
MCHC: 33.2 g/dL (ref 30.0–36.0)
MCV: 94.5 fL (ref 80.0–100.0)
Monocytes Absolute: 0.7 10*3/uL (ref 0.1–1.0)
Monocytes Relative: 5 %
Neutro Abs: 9.7 10*3/uL — ABNORMAL HIGH (ref 1.7–7.7)
Neutrophils Relative %: 77 %
Platelets: 320 10*3/uL (ref 150–400)
RBC: 3.99 MIL/uL (ref 3.87–5.11)
RDW: 12.5 % (ref 11.5–15.5)
WBC: 12.5 10*3/uL — ABNORMAL HIGH (ref 4.0–10.5)
nRBC: 0 % (ref 0.0–0.2)

## 2022-07-30 LAB — CYTOLOGY - NON PAP

## 2022-07-30 LAB — COMPREHENSIVE METABOLIC PANEL
ALT: 22 U/L (ref 0–44)
AST: 19 U/L (ref 15–41)
Albumin: 3.3 g/dL — ABNORMAL LOW (ref 3.5–5.0)
Alkaline Phosphatase: 53 U/L (ref 38–126)
Anion gap: 9 (ref 5–15)
BUN: 25 mg/dL — ABNORMAL HIGH (ref 8–23)
CO2: 27 mmol/L (ref 22–32)
Calcium: 8.8 mg/dL — ABNORMAL LOW (ref 8.9–10.3)
Chloride: 100 mmol/L (ref 98–111)
Creatinine, Ser: 0.7 mg/dL (ref 0.44–1.00)
GFR, Estimated: >60 mL/min (ref >60)
Glucose, Bld: 103 mg/dL — ABNORMAL HIGH (ref 70–99)
Potassium: 4 mmol/L (ref 3.5–5.1)
Sodium: 136 mmol/L (ref 135–145)
Total Bilirubin: 0.8 mg/dL (ref 0.3–1.2)
Total Protein: 6.6 g/dL (ref 6.5–8.1)

## 2022-07-30 LAB — IRON AND TIBC
Iron: 73 ug/dL (ref 28–170)
Saturation Ratios: 25 % (ref 10.4–31.8)
TIBC: 296 ug/dL (ref 250–450)
UIBC: 223 ug/dL

## 2022-07-30 LAB — VITAMIN B12: Vitamin B-12: 466 pg/mL (ref 180–914)

## 2022-07-30 LAB — PHOSPHORUS: Phosphorus: 3.8 mg/dL (ref 2.5–4.6)

## 2022-07-30 LAB — FOLATE: Folate: 11.7 ng/mL (ref >5.9)

## 2022-07-30 LAB — TSH: TSH: 0.53 u[IU]/mL (ref 0.350–4.500)

## 2022-07-30 LAB — CULTURE, RESPIRATORY W GRAM STAIN: Culture: NORMAL

## 2022-07-30 LAB — MAGNESIUM: Magnesium: 2.2 mg/dL (ref 1.7–2.4)

## 2022-07-30 LAB — FERRITIN: Ferritin: 201 ng/mL (ref 11–307)

## 2022-07-30 MED ORDER — SODIUM CHLORIDE 3 % IN NEBU
4.0000 mL | INHALATION_SOLUTION | Freq: Two times a day (BID) | RESPIRATORY_TRACT | Status: DC
  Administered 2022-07-30 – 2022-07-31 (×2): 4 mL via RESPIRATORY_TRACT
  Filled 2022-07-30 (×4): qty 4

## 2022-07-30 MED ORDER — FUROSEMIDE 10 MG/ML IJ SOLN
40.0000 mg | Freq: Once | INTRAMUSCULAR | Status: AC
  Administered 2022-07-30: 40 mg via INTRAVENOUS
  Filled 2022-07-30: qty 4

## 2022-07-30 NOTE — Progress Notes (Signed)
07/30/2022   I have seen and evaluated the patient for resp failure.  S:  Continues to improve, sats 95% on 3LPM.  O: No distress Bedside US with R>L atelectasis mild, good diaphragmatic excursion Ext warm  A:  Acute hypoxemic respiratory failure due to mucous plugging of bronchi from unclear source.  Infectious workup unremarkable.  P:  - Trial of HTS nebs - Okay to try some more diuresis - Push O2 wean for sats >90%, dropped her to 1.5LPM - Home O2 eval - Encourage IS and flutter - Available as needed, f/u arranged in office  Erskine Emery MD [facility] [place] epic messenger for cross cover needs If after hours, please call E-link

## 2022-07-30 NOTE — Progress Notes (Signed)
SATURATION QUALIFICATIONS: (This note is used to comply with regulatory documentation for home oxygen)  Patient Saturations on Room Air at Rest = 93%  Patient Saturations on Room Air while Ambulating = 88%  Patient Saturations on 2 Liters of oxygen while Ambulating = 94%  Patient requires oxygen to maintain oxygen saturations during activity.

## 2022-07-30 NOTE — Progress Notes (Addendum)
PROGRESS NOTE    Caitlin Stevenson  LNL:[id number] DOB: [birth year] DOA: 07/25/2022 PCP: Reynold Bowen, MD   Brief Narrative:  Caitlin Stevenson is a 71 yo female with PMH asthma (follows with pulmonology), HTN, hypothyroidism, depression, psoriasis, sleep apnea. Patient presented to [facility] with worsening shortness of breath and cough.  She was found to be in the 80s on room air on presentation. She was recently seen outpatient by pulmonology on 07/20/2022 and underwent viral testing which was negative for COVID, flu, RSV.  She did have exposure to a grandchild with RSV however testing for the patient was negative on 11/20.    She has been on a prednisone taper outpatient along with hycodan and albuterol for symptom management at home.  CXR obtained showed concern for right upper lobe collapse.  She then underwent CT chest which showed "near complete collapse of the right upper and left lower lobes". Concern was for underlying mucous plugging.   Repeat Covid and Flu testing at [facility] again were negative.    She was requiring salter HF 8 L on workup to maintain adequate oxygenation and was transferred to [facility] for further treatment and workup. Repeat RVP testing on admission was also negative.  Pulmonology was consulted.    **Interim History Patient underwent bronchoscopy for mucous plugging and is improved but continues to still have some rhonchi.  Pulmonary recommending working on her mobility and oxygen weaning as well as getting a flutter valve.  They do not know why she ended up like this however recommending changing steroids to prednisone 40 mg a day for 5 days.  She will need an amatory home O2 screen prior to discharge and pulmonary recommends continue Brovana, Pulmicort and DuoNebs for now.  They are recommending following up on the tracheal aspirate and recommending continuing mucolytic's and repeating chest x-ray in the a.m.  O2 requirements are weaning and pulmonary was asked to  reassess given her continued oxygen requirements and they are recommending a trial of hypertonic saline nebs.  Will give her a dose of IV Lasix today.  Will do an ambulatory home O2 screen and likely discharge home on oxygen tomorrow  Assessment and Plan:  Acute hypoxemic respiratory failure ([place]) -CXR and CT chest consistent with mucous plugging accounting for etiology -SpO2: 95 % O2 Flow Rate (L/min): 3 L/min FiO2 (%): 32 % -See mucous plugging -Continue weaning as able.  Not on oxygen at home -Patient will need ambulatory home O2 screen prior to discharge and repeat chest x-ray in the morning -Home very following and appreciate further evaluation and they are recommending continuing Brovana, Pulmicort and DuoNebs as well as aggressive pulmonary hygiene -Pulmonary recommends continue mobilization as well as continue steroids and transition to oral prednisone for 5 days -Going to get a dose of IV Lasix 40 mg x1 and Pulmonary recommending trial of Hypertonic Saline Nebs -Ambulatory Home O2 Screen done and she desaturated to 88% so she will require at least 2 L of supplemental oxygen via nasal cannula ambulating -Patient will follow-up with pulmonary after discharge   Mucus plug in respiratory tract -CXT and CT chest consistent with what appears to be mucous plugging in setting of URI -responded to CPT as evidenced by improvement in CXR  -Pulmonology consulted for further assistance also -Continue current treatment; pulmonology has added further treatment on as well recommending weaning off oxygen to off for sats greater than 90% -PCT negative; WBC considered due to steroid use; afebrile. At this time d/c abx and  monitor clinically  -Bronchoscopy done and PGP negative on FOB and will follow-up on tracheal aspirate and culture -Continue with aggressive pulmonary hygiene, mobilization, Brovana, Pulmicort and DuoNebs as well as steroids with Solu-Medrol -Pulmonary recommends continue  antitussives with guaifenesin and Hycodan  -Now Pulmonary going to start Hypertonic Saline Nebs and will give a dose of IV Lasix -CT chest showed mucous plugging with shifting atelectasis and is improved with conservative management and she required intubation for a fiberoptic bronchoscopy on 07/28/2002. -Repeat chest x-ray in the a.m.   Essential Hypertension -Continue Benicar-HCTZ -Continue to Monitor BP per Protocol  -Last BP reading was 148/76   Hyperlipidemia - No statin noted on home med rec   Diabetes mellitus type 2 in obese (HCC) -HbA1c 5.8% -Continue with Moderate Novolog SSI and CBG monitoring -CBGs ranging from 97-114 and was 103 on this AM's CMP    Hypothyroidism -Continue Levothyroxine 137 mcg daily. -Checked TSH and is now 0.530    Depression and Anxiety  -C/w Citalopram 40 mg po qHS   Normocytic Anemia -Patient's Hgb/Hct went from 11.9/36.1 -> 12.2/37.7 -> 12.5/37.7 -Check Anemia Panel in the AM -Continue to Monitor for S/Sx of Bleeding; No overt bleeding noted -Repeat CBC in the AM    Hyponatremia -Mild. Na+ went from 135 -> 134 -> 136 -Continue to Monitor and Trend and repeat CMP in the AM   Leukocytosis -Likely reactive in the setting of steroid use as she is on Methylprednisolone IV 40 mg Daily  -Patient's WBC went from 14.2 -> 14.8 -> 12.5 -Continue to Monitor and Trend and repeat CBC in the AM    Obesity -Complicates overall prognosis and care -Estimated body mass index is 36.14 kg/m as calculated from the following:   Height as of this encounter: _0  (1.626 m).   Weight as of this encounter: 95.5 kg.  -Weight Loss and Dietary Counseling given  DVT prophylaxis: SCDs Start: 07/25/22 2211    Code Status: Full Code Family Communication: No family present at bedside   Disposition Plan:  Level of care: Stepdown Status is: Inpatient Remains inpatient appropriate because: Still requiring O2 and will need an Ambulatory Home O2 screen prior to  D/C    Consultants:  Pulmonary/PCCM  Procedures:  Procedure(s):  Flexible bronchoscopy with bronchial alveolar lavage (93810) and Initial Therapeutic Aspiration of Tracheobronchial Tree (17510) done by Dr. Erskine Emery  Antimicrobials:  Anti-infectives (From admission, onward)    Start     Dose/Rate Route Frequency Ordered Stop   07/26/22 1700  cefTRIAXone (ROCEPHIN) 1 g in sodium chloride 0.9 % 100 mL IVPB  Status:  Discontinued        1 g 200 mL/hr over 30 Minutes Intravenous Every 24 hours 07/25/22 2211 07/26/22 0942   07/26/22 1700  azithromycin (ZITHROMAX) tablet 500 mg  Status:  Discontinued        500 mg Oral Daily 07/25/22 2209 07/26/22 0942   07/25/22 1700  cefTRIAXone (ROCEPHIN) 1 g in sodium chloride 0.9 % 100 mL IVPB        1 g 200 mL/hr over 30 Minutes Intravenous  Once 07/25/22 1656 07/25/22 1729   07/25/22 1700  azithromycin (ZITHROMAX) 500 mg in sodium chloride 0.9 % 250 mL IVPB        500 mg 250 mL/hr over 60 Minutes Intravenous  Once 07/25/22 1656 07/25/22 1856       Subjective: Seen and examined at bedside and respiratory status is improving slowly.  Oxygen requirement is weaning and  she is down from 8 L to 5 and further to 3.  Pulmonary was able to get her to 1-1/2.  She will need an amatory home O2 screen prior to discharge.  No other concerns or close at this time and feels okay.  Objective: Vitals:   07/30/22 1000 07/30/22 1050 07/30/22 1217 07/30/22 1226  BP:  (!) 148/76    Pulse: 78 80    Resp: 11 14    Temp:    98.9 F (37.2 C)  TempSrc:    Axillary  SpO2: 95% 94% 95%   Weight:      Height:        Intake/Output Summary (Last 24 hours) at 07/30/2022 1350 Last data filed at 07/30/2022 1339 Gross per 24 hour  Intake 480.52 ml  Output 2350 ml  Net -1869.48 ml   Filed Weights   07/25/22 1444 07/25/22 2100  Weight: 94.7 kg 95.5 kg   Examination: Physical Exam:  Constitutional: WN/WD obese Caucasian female currently no acute distress appears  calm but does have a dry cough Respiratory: Diminished to auscultation bilaterally worse on the right compared to left, no wheezing, rales, rhonchi or crackles. Normal respiratory effort and patient is not tachypenic. No accessory muscle use.  Wearing supplemental oxygen via nasal cannula Cardiovascular: RRR, no murmurs / rubs / gallops. S1 and S2 auscultated. No extremity edema. 2+ pedal pulses. No carotid bruits.  Abdomen: Soft, non-tender, distended secondary body habitus bowel sounds positive.  GU: Deferred. Musculoskeletal: No clubbing / cyanosis of digits/nails. No joint deformity upper and lower extremities. Skin: No rashes, lesions, ulcers on limited skin evaluation. No induration; Warm and dry.  Neurologic: CN 2-12 grossly intact with no focal deficits. Romberg sign and cerebellar reflexes not assessed.  Psychiatric: Normal judgment and insight. Alert and oriented x 3. Normal mood and appropriate affect.   Data Reviewed: I have personally reviewed following labs and imaging studies  CBC: Recent Labs  Lab 07/26/22 0242 07/27/22 0242 07/28/22 0242 07/29/22 0249 07/30/22 0252  WBC 12.5* 14.5* 14.2* 14.8* 12.5*  NEUTROABS 10.1* 12.1* 11.5* 12.2* 9.7*  HGB 12.0 12.3 11.9* 12.2 12.5  HCT 35.7* 37.8 36.1 37.7 37.7  MCV 92.2 95.5 94.3 95.2 94.5  PLT 314 323 312 299 540   Basic Metabolic Panel: Recent Labs  Lab 07/26/22 0242 07/27/22 0242 07/28/22 0242 07/29/22 0249 07/30/22 0252  NA 138 138 135 134* 136  K 3.9 4.1 4.0 4.4 4.0  CL 102 102 99 99 100  CO2 _0 GLUCOSE 145* 116* 109* 102* 103*  BUN 16 19 25* 24* 25*  CREATININE 0.71 0.64 0.77 0.64 0.70  CALCIUM 8.8* 8.7* 8.5* 8.4* 8.8*  MG 2.3 2.3 2.3 2.3 2.2  PHOS  --   --   --   --  3.8   GFR: Estimated Creatinine Clearance: 71.2 mL/min (by C-G formula based on SCr of 0.7 mg/dL). Liver Function Tests: Recent Labs  Lab 07/26/22 0242 07/30/22 0252  AST 26 19  ALT 22 22  ALKPHOS 54 53  BILITOT 0.4 0.8   PROT 6.2* 6.6  ALBUMIN 3.5 3.3*   No results for input(s): "LIPASE", "AMYLASE" in the last 168 hours. No results for input(s): "AMMONIA" in the last 168 hours. Coagulation Profile: No results for input(s): "INR", "PROTIME" in the last 168 hours. Cardiac Enzymes: Recent Labs  Lab 07/28/22 0242  CKTOTAL 162   BNP (last 3 results) No results for input(s): "PROBNP" in the last 8760 hours.  HbA1C: No results for input(s): "HGBA1C" in the last 72 hours. CBG: Recent Labs  Lab 07/28/22 2132 07/29/22 0832 07/29/22 1139 07/30/22 0755 07/30/22 1147  GLUCAP 137* 92 112* 103* 114*   Lipid Profile: No results for input(s): "CHOL", "HDL", "LDLCALC", "TRIG", "CHOLHDL", "LDLDIRECT" in the last 72 hours. Thyroid Function Tests: Recent Labs    07/30/22 0252  TSH 0.530   Anemia Panel: Recent Labs    07/28/22 0242 07/30/22 0252  VITAMINB12  --  466  FOLATE  --  11.7  FERRITIN 147 201  TIBC  --  296  IRON  --  73  RETICCTPCT  --  2.8   Sepsis Labs: Recent Labs  Lab 07/26/22 0242 07/27/22 0242 07/28/22 0242  PROCALCITON <0.10 <0.10 <0.10    Recent Results (from the past 240 hour(s))  COVID-19, Flu A+B and RSV     Status: None   Collection Time: 07/20/22  2:13 PM   Specimen: Nasopharyngeal(NP) swabs in vial transport medium   Nasopharynge  Previous  Result Value Ref Range Status   SARS-CoV-2, NAA Not Detected Not Detected Final   Influenza A, NAA Not Detected Not Detected Final   Influenza B, NAA Not Detected Not Detected Final   RSV, NAA Not Detected Not Detected Final   Test Information: Comment  Final    Comment: This nucleic acid amplification test was developed and its performance characteristics determined by [employer]. Nucleic acid amplification tests include RT-PCR and TMA. This test has not been FDA cleared or approved. This test has been authorized by FDA under an Emergency Use Authorization (EUA). This test is only authorized for the duration of  time the declaration that circumstances exist justifying the authorization of the emergency use of in vitro diagnostic tests for detection of SARS-CoV-2 virus and/or diagnosis of COVID-19 infection under section 564(b)(1) of the Act, 21 U.S.C. 253GUY-4(I) (1), unless the authorization is terminated or revoked sooner. When diagnostic testing is negative, the possibility of a false negative result should be considered in the context of a patient's recent exposures and the presence of clinical signs and symptoms consistent with COVID-19. An individual without symptoms of COVID-19 and who is not shedding SARS-CoV-2 virus wo uld expect to have a negative (not detected) result in this assay.   Resp Panel by RT-PCR (Flu A&B, Covid) Anterior Nasal Swab     Status: None   Collection Time: 07/25/22  2:58 PM   Specimen: Anterior Nasal Swab  Result Value Ref Range Status   SARS Coronavirus 2 by RT PCR NEGATIVE NEGATIVE Final    Comment: (NOTE) SARS-CoV-2 target nucleic acids are NOT DETECTED.  The SARS-CoV-2 RNA is generally detectable in upper respiratory specimens during the acute phase of infection. The lowest concentration of SARS-CoV-2 viral copies this assay can detect is 138 copies/mL. A negative result does not preclude SARS-Cov-2 infection and should not be used as the sole basis for treatment or other patient management decisions. A negative result may occur with  improper specimen collection/handling, submission of specimen other than nasopharyngeal swab, presence of viral mutation(s) within the areas targeted by this assay, and inadequate number of viral copies(<138 copies/mL). A negative result must be combined with clinical observations, patient history, and epidemiological information. The expected result is Negative.  Fact Sheet for Patients:  EntrepreneurPulse.com.au  Fact Sheet for Healthcare Providers:  IncredibleEmployment.be  This  test is no t yet approved or cleared by the [place] FDA and  has been authorized for detection and/or  diagnosis of SARS-CoV-2 by FDA under an Emergency Use Authorization (EUA). This EUA will remain  in effect (meaning this test can be used) for the duration of the COVID-19 declaration under Section 564(b)(1) of the Act, 21 U.S.C.section 360bbb-3(b)(1), unless the authorization is terminated  or revoked sooner.       Influenza A by PCR NEGATIVE NEGATIVE Final   Influenza B by PCR NEGATIVE NEGATIVE Final    Comment: (NOTE) The Xpert Xpress SARS-CoV-2/FLU/RSV plus assay is intended as an aid in the diagnosis of influenza from Nasopharyngeal swab specimens and should not be used as a sole basis for treatment. Nasal washings and aspirates are unacceptable for Xpert Xpress SARS-CoV-2/FLU/RSV testing.  Fact Sheet for Patients: EntrepreneurPulse.com.au  Fact Sheet for Healthcare Providers: IncredibleEmployment.be  This test is not yet approved or cleared by the [place] FDA and has been authorized for detection and/or diagnosis of SARS-CoV-2 by FDA under an Emergency Use Authorization (EUA). This EUA will remain in effect (meaning this test can be used) for the duration of the COVID-19 declaration under Section 564(b)(1) of the Act, 21 U.S.C. section 360bbb-3(b)(1), unless the authorization is terminated or revoked.  Performed at [facility], [place] [place], [place] [address]   MRSA Next Gen by PCR, Nasal     Status: None   Collection Time: 07/25/22  8:52 PM   Specimen: Nasal Mucosa; Nasal Swab  Result Value Ref Range Status   MRSA by PCR Next Gen NOT DETECTED NOT DETECTED Final    Comment: (NOTE) The GeneXpert MRSA Assay (FDA approved for NASAL specimens only), is one component of a comprehensive MRSA colonization surveillance program. It is not intended to diagnose MRSA infection nor to guide or monitor  treatment for MRSA infections. Test performance is not FDA approved in patients less than 20 years old. Performed at [facility], [place] [address], [place], [place] [address]   Respiratory (~20 pathogens) panel by PCR     Status: None   Collection Time: 07/25/22 10:10 PM   Specimen: Nasopharyngeal Swab; Respiratory  Result Value Ref Range Status   Adenovirus NOT DETECTED NOT DETECTED Final   Coronavirus 229E NOT DETECTED NOT DETECTED Final    Comment: (NOTE) The Coronavirus on the Respiratory Panel, DOES NOT test for the novel  Coronavirus (2019 nCoV)    Coronavirus HKU1 NOT DETECTED NOT DETECTED Final   Coronavirus NL63 NOT DETECTED NOT DETECTED Final   Coronavirus OC43 NOT DETECTED NOT DETECTED Final   Metapneumovirus NOT DETECTED NOT DETECTED Final   Rhinovirus / Enterovirus NOT DETECTED NOT DETECTED Final   Influenza A NOT DETECTED NOT DETECTED Final   Influenza B NOT DETECTED NOT DETECTED Final   Parainfluenza Virus 1 NOT DETECTED NOT DETECTED Final   Parainfluenza Virus 2 NOT DETECTED NOT DETECTED Final   Parainfluenza Virus 3 NOT DETECTED NOT DETECTED Final   Parainfluenza Virus 4 NOT DETECTED NOT DETECTED Final   Respiratory Syncytial Virus NOT DETECTED NOT DETECTED Final   Bordetella pertussis NOT DETECTED NOT DETECTED Final   Bordetella Parapertussis NOT DETECTED NOT DETECTED Final   Chlamydophila pneumoniae NOT DETECTED NOT DETECTED Final   Mycoplasma pneumoniae NOT DETECTED NOT DETECTED Final    Comment: Performed at [facility], [place]. [address] [place], [place] [address]  Expectorated Sputum Assessment w Gram Stain, Rflx to Resp Cult     Status: None   Collection Time: 07/27/22  2:28 PM   Specimen: Sputum  Result Value  Ref Range Status   Specimen Description SPUTUM  Final   Special Requests NONE  Final   Sputum evaluation   Final    THIS SPECIMEN IS ACCEPTABLE FOR SPUTUM CULTURE Performed at [facility], [place] [address] [place], [place] [address]    Report Status 07/27/2022 FINAL  Final  Culture, Respiratory w Gram Stain     Status: None   Collection Time: 07/27/22  2:28 PM   Specimen: SPU  Result Value Ref Range Status   Specimen Description   Final    SPUTUM Performed at [place] [address], [place], [place] [address]    Special Requests   Final    NONE Reflexed from [id number] Performed at [facility], [place] [address] [place], [place] [address]    Gram Stain   Final    RARE SQUAMOUS EPITHELIAL CELLS PRESENT RARE WBC PRESENT,BOTH PMN AND MONONUCLEAR MODERATE GRAM POSITIVE COCCI IN CHAINS MODERATE GRAM POSITIVE COCCI IN CLUSTERS    Culture   Final    FEW Normal respiratory flora-no Staph aureus or Pseudomonas seen Performed at [facility], [address] [place], [place] [address]    Report Status 07/30/2022 FINAL  Final  Respiratory (~20 pathogens) panel by PCR     Status: None   Collection Time: 07/28/22  9:59 AM   Specimen: Nasopharyngeal Swab; Respiratory  Result Value Ref Range Status   Adenovirus NOT DETECTED NOT DETECTED Final   Coronavirus 229E NOT DETECTED NOT DETECTED Final    Comment: (NOTE) The Coronavirus on the Respiratory Panel, DOES NOT test for the novel  Coronavirus (2019 nCoV)    Coronavirus HKU1 NOT DETECTED NOT DETECTED Final   Coronavirus NL63 NOT DETECTED NOT DETECTED Final   Coronavirus OC43 NOT DETECTED NOT DETECTED Final   Metapneumovirus NOT DETECTED NOT DETECTED Final   Rhinovirus / Enterovirus NOT DETECTED NOT DETECTED Final   Influenza A NOT DETECTED NOT DETECTED Final   Influenza B NOT DETECTED NOT DETECTED Final   Parainfluenza Virus 1 NOT DETECTED NOT DETECTED Final   Parainfluenza Virus 2 NOT DETECTED NOT DETECTED Final   Parainfluenza Virus 3 NOT DETECTED NOT DETECTED Final   Parainfluenza Virus 4 NOT DETECTED NOT DETECTED Final   Respiratory Syncytial Virus NOT DETECTED NOT DETECTED Final    Bordetella pertussis NOT DETECTED NOT DETECTED Final   Bordetella Parapertussis NOT DETECTED NOT DETECTED Final   Chlamydophila pneumoniae NOT DETECTED NOT DETECTED Final   Mycoplasma pneumoniae NOT DETECTED NOT DETECTED Final    Comment: Performed at [facility], [place]. [address], [place], [place] [address]  Culture, Respiratory w Gram Stain     Status: None (Preliminary result)   Collection Time: 07/28/22  1:40 PM   Specimen: Bronchoalveolar Lavage; Respiratory  Result Value Ref Range Status   Specimen Description   Final    BRONCHIAL ALVEOLAR LAVAGE Performed at [place] [address], [place], [place] [address]    Special Requests   Final    NONE Performed at [facility], [place] [address], [place], [place] [address]    Gram Stain   Final    FEW WBC PRESENT, PREDOMINANTLY MONONUCLEAR NO ORGANISMS SEEN    Culture   Final    CULTURE REINCUBATED FOR BETTER GROWTH Performed at [facility], [place] [address] [place],  [address]    Report Status PENDING  Incomplete  Pneumocystis smear by DFA  Status: None   Collection Time: 07/28/22  1:40 PM   Specimen: Bronchoalveolar Lavage; Respiratory  Result Value Ref Range Status   Specimen Source-[facility] BRONCHIAL ALVEOLAR LAVAGE  Final   Pneumocystis jiroveci Ag NEGATIVE  Final    Comment: Performed at [place] Performed at [place] [address] [place], [place] [address]     Radiology Studies: DG CHEST PORT 1 VIEW  Result Date: 07/30/2022 CLINICAL DATA:  Acute respiratory failure with hypoxia EXAM: PORTABLE CHEST 1 VIEW COMPARISON:  07/26/2022 plain film and CT of 07/27/2022 FINDINGS: The Chin overlies the apices. Reverse apical lordotic positioning. Patient rotated right. Mild cardiomegaly with tortuous thoracic aorta. Similar small bilateral pleural effusions. No pneumothorax. Low lung volumes with resultant pulmonary interstitial  prominence. No overt congestive failure. Similar left and slightly worsened right base airspace disease. IMPRESSION: Slightly worsened right base airspace disease. Persistent left base airspace disease. This could represent atelectasis or infection. Similar small bilateral pleural effusions. Cardiomegaly and low lung volumes. Electronically Signed   By: Abigail Miyamoto M.D.   On: 07/30/2022 08:11    Scheduled Meds:  arformoterol  15 mcg Nebulization BID   budesonide (PULMICORT) nebulizer solution  0.5 mg Nebulization BID   Chlorhexidine Gluconate Cloth  6 each Topical Daily   citalopram  40 mg Oral QHS   guaiFENesin  600 mg Oral BID   irbesartan  150 mg Oral Daily   And   hydrochlorothiazide  12.5 mg Oral Daily   hydrOXYzine  25 mg Oral QHS   insulin aspart  0-15 Units Subcutaneous TID WC   insulin aspart  0-5 Units Subcutaneous QHS   ipratropium-albuterol  3 mL Nebulization Q6H   levothyroxine  137 mcg Oral Q0600   methylPREDNISolone (SOLU-MEDROL) injection  40 mg Intravenous Daily   sodium chloride HYPERTONIC  4 mL Nebulization BID   Continuous Infusions:   LOS: 5 days   Raiford Noble, DO [facility] Available via Epic secure chat 7am-7pm After these hours, please refer to coverage provider listed on [email or web address] 07/30/2022, 1:50 PM

## 2022-07-31 ENCOUNTER — Inpatient Hospital Stay (HOSPITAL_COMMUNITY): Payer: Medicare Other

## 2022-07-31 DIAGNOSIS — I10 Essential (primary) hypertension: Secondary | ICD-10-CM | POA: Diagnosis not present

## 2022-07-31 DIAGNOSIS — E871 Hypo-osmolality and hyponatremia: Secondary | ICD-10-CM

## 2022-07-31 DIAGNOSIS — D649 Anemia, unspecified: Secondary | ICD-10-CM

## 2022-07-31 DIAGNOSIS — T17998A Other foreign object in respiratory tract, part unspecified causing other injury, initial encounter: Secondary | ICD-10-CM | POA: Diagnosis not present

## 2022-07-31 DIAGNOSIS — J9601 Acute respiratory failure with hypoxia: Secondary | ICD-10-CM | POA: Diagnosis not present

## 2022-07-31 DIAGNOSIS — E785 Hyperlipidemia, unspecified: Secondary | ICD-10-CM | POA: Diagnosis not present

## 2022-07-31 LAB — CBC WITH DIFFERENTIAL/PLATELET
Abs Immature Granulocytes: 0.08 10*3/uL — ABNORMAL HIGH (ref 0.00–0.07)
Basophils Absolute: 0 10*3/uL (ref 0.0–0.1)
Basophils Relative: 0 %
Eosinophils Absolute: 0.1 10*3/uL (ref 0.0–0.5)
Eosinophils Relative: 0 %
HCT: 39.8 % (ref 36.0–46.0)
Hemoglobin: 13.6 g/dL (ref 12.0–15.0)
Immature Granulocytes: 1 %
Lymphocytes Relative: 17 %
Lymphs Abs: 2.2 10*3/uL (ref 0.7–4.0)
MCH: 31.7 pg (ref 26.0–34.0)
MCHC: 34.2 g/dL (ref 30.0–36.0)
MCV: 92.8 fL (ref 80.0–100.0)
Monocytes Absolute: 0.9 10*3/uL (ref 0.1–1.0)
Monocytes Relative: 7 %
Neutro Abs: 9.5 10*3/uL — ABNORMAL HIGH (ref 1.7–7.7)
Neutrophils Relative %: 75 %
Platelets: 307 10*3/uL (ref 150–400)
RBC: 4.29 MIL/uL (ref 3.87–5.11)
RDW: 12.7 % (ref 11.5–15.5)
WBC: 12.7 10*3/uL — ABNORMAL HIGH (ref 4.0–10.5)
nRBC: 0 % (ref 0.0–0.2)

## 2022-07-31 LAB — COMPREHENSIVE METABOLIC PANEL
ALT: 25 U/L (ref 0–44)
AST: 24 U/L (ref 15–41)
Albumin: 3.5 g/dL (ref 3.5–5.0)
Alkaline Phosphatase: 51 U/L (ref 38–126)
Anion gap: 12 (ref 5–15)
BUN: 29 mg/dL — ABNORMAL HIGH (ref 8–23)
CO2: 26 mmol/L (ref 22–32)
Calcium: 9.2 mg/dL (ref 8.9–10.3)
Chloride: 96 mmol/L — ABNORMAL LOW (ref 98–111)
Creatinine, Ser: 0.8 mg/dL (ref 0.44–1.00)
GFR, Estimated: >60 mL/min (ref >60)
Glucose, Bld: 143 mg/dL — ABNORMAL HIGH (ref 70–99)
Potassium: 3.5 mmol/L (ref 3.5–5.1)
Sodium: 134 mmol/L — ABNORMAL LOW (ref 135–145)
Total Bilirubin: 0.5 mg/dL (ref 0.3–1.2)
Total Protein: 6.9 g/dL (ref 6.5–8.1)

## 2022-07-31 LAB — CULTURE, RESPIRATORY W GRAM STAIN: Culture: NORMAL

## 2022-07-31 LAB — GLUCOSE, CAPILLARY
Glucose-Capillary: 107 mg/dL — ABNORMAL HIGH (ref 70–99)
Glucose-Capillary: 126 mg/dL — ABNORMAL HIGH (ref 70–99)

## 2022-07-31 LAB — ACID FAST SMEAR (AFB, MYCOBACTERIA): Acid Fast Smear: NEGATIVE

## 2022-07-31 LAB — MAGNESIUM: Magnesium: 1.9 mg/dL (ref 1.7–2.4)

## 2022-07-31 LAB — PHOSPHORUS: Phosphorus: 4 mg/dL (ref 2.5–4.6)

## 2022-07-31 MED ORDER — PREDNISONE 20 MG PO TABS: 40.0000 mg | ORAL_TABLET | Freq: Every day | ORAL | 0 refills | Status: DC

## 2022-07-31 MED ORDER — ACETAMINOPHEN 325 MG PO TABS: 650.0000 mg | ORAL_TABLET | Freq: Four times a day (QID) | ORAL | 0 refills | Status: DC | PRN

## 2022-07-31 MED ORDER — ONDANSETRON HCL 4 MG PO TABS: 4.0000 mg | ORAL_TABLET | Freq: Four times a day (QID) | ORAL | 0 refills | Status: AC | PRN

## 2022-07-31 MED ORDER — GUAIFENESIN ER 600 MG PO TB12: 600.0000 mg | ORAL_TABLET | Freq: Two times a day (BID) | ORAL | 0 refills | Status: AC

## 2022-07-31 NOTE — TOC Transition Note (Signed)
Transition of Care [facility]) - CM/SW Discharge Note   Patient Details  Name: Caitlin Stevenson MRN: [id number] Date of Birth: 1950-09-15  [facility] of [facility]) CM/SW Contact:  Vassie Moselle, LCSW Phone Number: 07/31/2022, 11:29 AM   Clinical Narrative:    Met with pt and confirmed plan for home health. Pt does not have preference for HHA. Pt is also agreeable to having home O2 ordered/delivered. HHPT has been arranged with [facility]. O2 is to be delivered by [employer] prior to discharge.    Final next level of care: [place] Barriers to Discharge: No Barriers Identified   Patient Goals and CMS Choice Patient states their goals for this hospitalization and ongoing recovery are:: To go home CMS Medicare.gov Compare Post Acute Care list provided to:: Patient Choice offered to / list presented to : Patient  Discharge Placement                       Discharge Plan and Services In-house Referral: NA Discharge Planning Services: CM Consult Post Acute Care Choice: Durable Medical Equipment, Home Health          DME Arranged: Oxygen DME Agency: [employer] Date DME Agency Contacted: 07/31/22 Time DME Agency Contacted: [phone] Representative spoke with at DME Agency: [place]: PT [place]: [place] Date [place]: 07/31/22 Time [place]: 1128 Representative spoke with at [place]: [place] ([place]) Interventions     Readmission Risk Interventions    07/31/2022   11:27 AM  Readmission Risk Prevention Plan  Post Dischage Appt Complete  Medication Screening Complete  Transportation Screening Complete

## 2022-07-31 NOTE — Progress Notes (Addendum)
Physical Therapy Treatment Patient Details Name: Caitlin Stevenson MRN: [id number] DOB: 1951-08-11 Today's Date: 07/31/2022   History of Present Illness Caitlin Stevenson is a 71 yo female with PMH asthma (follows with pulmonology), HTN, hypothyroidism, depression, psoriasis, sleep apnea.  Patient presented to [facility] with worsening shortness of breath and cough. Patient admtited with acute respiratory failure.  CXR with RUL collapse confirmed on CT chest including in left lower lobes. Repeat COVID, influenza neg. Patient underwent bronchoscopy for mucous plugging 11/28 , intubated and extubated same day    PT Comments    Pt sitting in recliner, ready to d/c home once O2 delivered, requesting to ambulate with therapy. Pt ambulates with therapy, practicing managing O2 tank on wheels and O2 tubing, pt prefers to walk arm in arm with therapist, good steadiness without LOB. Educated pt on O2 tank maneuvering with ambulation, O2 tubing management, checking SpO2, taking rest breaks as needed, pt verbalizes understanding. Pt with dyspnea on 2L O2, SpO2 94%, able to continue ambulation after ~1 minute standing rest break. Returned to room with DME O2 provider present.   Recommendations for follow up therapy are one component of a multi-disciplinary discharge planning process, led by the attending physician.  Recommendations may be updated based on patient status, additional functional criteria and insurance authorization.  Follow Up Recommendations  Home health PT     Assistance Recommended at Discharge PRN  Patient can return home with the following Assistance with cooking/housework;Help with stairs or ramp for entrance   Equipment Recommendations  None recommended by PT    Recommendations for Other Services       Precautions / Restrictions Precautions Precautions: Fall Precaution Comments: monitor O2 sats Restrictions Weight Bearing Restrictions: No     Mobility  Bed Mobility  General bed  mobility comments: up in chair    Transfers Overall transfer level: Needs assistance Equipment used: None Transfers: Sit to/from Stand Sit to Stand: Modified independent (Device/Increase time)  General transfer comment: powers to stand without AD, slight use of BUE    Ambulation/Gait Ambulation/Gait assistance: Supervision, Min guard Gait Distance (Feet): 300 Feet Assistive device: 1 person hand held assist Gait Pattern/deviations: Step-through pattern, Decreased stride length Gait velocity: decreased  General Gait Details: HHA per pt request, pt pulling O2 tank to practice navigating around obstacles, 1 standing rest break with dyspnea 3/4 on 2L O2 with SpO2 94%, able to continue ambulation after ~1 minute standing rest   Stairs             Wheelchair Mobility    Modified Rankin (Stroke Patients Only)       Balance Overall balance assessment: No apparent balance deficits (not formally assessed)     Cognition Arousal/Alertness: Awake/alert Behavior During Therapy: WFL for tasks assessed/performed Overall Cognitive Status: Within Functional Limits for tasks assessed     Exercises      General Comments        Pertinent Vitals/Pain Pain Assessment Pain Assessment: No/denies pain    Home Living                          Prior Function            PT Goals (current goals can now be found in the care plan section) Acute Rehab PT Goals PT Goal Formulation: With patient Time For Goal Achievement: 08/13/22 Potential to Achieve Goals: Good Progress towards PT goals: Progressing toward goals    Frequency    Min  3X/week      PT Plan Current plan remains appropriate    Co-evaluation              AM-PAC PT "6 Clicks" Mobility   Outcome Measure  Help needed turning from your back to your side while in a flat bed without using bedrails?: A Little Help needed moving from lying on your back to sitting on the side of a flat bed without  using bedrails?: A Little Help needed moving to and from a bed to a chair (including a wheelchair)?: A Little Help needed standing up from a chair using your arms (e.g., wheelchair or bedside chair)?: A Little Help needed to walk in hospital room?: A Little Help needed climbing 3-5 steps with a railing? : A Little 6 Click Score: 18    End of Session Equipment Utilized During Treatment: Oxygen Activity Tolerance: Patient tolerated treatment well Patient left: in chair;with call bell/phone within reach;with family/visitor present;Other (comment) (DME O2 delivery) Nurse Communication: Mobility status PT Visit Diagnosis: Difficulty in walking, not elsewhere classified (R26.2)     Time: 1027-2536 PT Time Calculation (min) (ACUTE ONLY): 11 min  Charges:  $Gait Training: 8-22 mins                      Tori Ambree Frances PT, DPT 07/31/22, 12:36 PM

## 2022-07-31 NOTE — Plan of Care (Signed)
Patient is discharging home, she is stable and in no distress at this time. Pt remains on 2L via [place], awaiting tank to be delivered to the room. IV and telemetry will be removed

## 2022-07-31 NOTE — Discharge Summary (Signed)
Physician Discharge Summary   Patient: Caitlin Stevenson MRN: [id number] DOB: Jan 31, 1951  Admit date:     07/25/2022  Discharge date: 07/31/22  Discharge Physician: Raiford Noble, DO   PCP: Reynold Bowen, MD   Recommendations at discharge:   Follow-up with PCP within 1 to 2 weeks and repeat CBC, CMP, mag, Phos within 1 week Repeat chest x-ray in 3 to 6 weeks Follow-up with pulmonary in outpatient setting within 1 to 2 weeks Continue with home health PT OT  Discharge Diagnoses: Principal Problem:   Acute hypoxemic respiratory failure ([place]) Active Problems:   Mucus plug in respiratory tract   Diabetes mellitus type 2 in obese ([place])   Hyperlipidemia   Essential hypertension   Morbid obesity due to excess calories ([place])   Hypothyroidism   Normocytic anemia   Leukocytosis   Hyponatremia  Resolved Problems:   * No resolved hospital problems. [facility] Course: Caitlin Stevenson is a 71 yo female with PMH asthma (follows with pulmonology), HTN, hypothyroidism, depression, psoriasis, sleep apnea. Patient presented to [facility] with worsening shortness of breath and cough.  She was found to be in the 80s on room air on presentation. She was recently seen outpatient by pulmonology on 07/20/2022 and underwent viral testing which was negative for COVID, flu, RSV.  She did have exposure to a grandchild with RSV however testing for the patient was negative on 11/20.    She has been on a prednisone taper outpatient along with hycodan and albuterol for symptom management at home.  CXR obtained showed concern for right upper lobe collapse.  She then underwent CT chest which showed "near complete collapse of the right upper and left lower lobes". Concern was for underlying mucous plugging.   Repeat Covid and Flu testing at [facility] again were negative.    She was requiring salter HF 8 L on workup to maintain adequate oxygenation and was transferred to [facility] for further treatment and workup. Repeat  RVP testing on admission was also negative.  Pulmonology was consulted.    **Interim History Patient underwent bronchoscopy for mucous plugging and is improved but continues to still have some rhonchi.  Pulmonary recommending working on her mobility and oxygen weaning as well as getting a flutter valve.  They do not know why she ended up like this however recommending changing steroids to prednisone 40 mg a day for 5 days.  She will need an amatory home O2 screen prior to discharge and pulmonary recommends continue Brovana, Pulmicort and DuoNebs for now.  They are recommending following up on the tracheal aspirate and recommending continuing mucolytic's and repeating chest x-ray in the a.m.   O2 requirements are weaning and pulmonary was asked to reassess given her continued oxygen requirements and they are recommending a trial of hypertonic saline nebs.  Will give her a dose of IV Lasix today.  Will do an ambulatory home O2 screen and likely discharge home on oxygen tomorrow   07/31/2022.  Amatory home O2 screen was done and she only desaturated when she is ambulating.  She will require 2 L supplemental oxygen.  She is significantly improved and pulmonary recommending a prednisone taper for 5 days.  She is medically stable for discharge at this time will need to follow-up with PCP as well as pulmonary in outpatient setting  Assessment and Plan:  Acute hypoxemic respiratory failure ([place]) -CXR and CT chest consistent with mucous plugging accounting for etiology -SpO2: 92 % O2 Flow Rate (L/min): 2 L/min  FiO2 (%): 32 % -See mucous plugging -Continue weaning as able.  Not on oxygen at home -Patient will need ambulatory home O2 screen prior to discharge and repeat chest x-ray in the morning -Home very following and appreciate further evaluation and they are recommending continuing Brovana, Pulmicort and DuoNebs as well as aggressive pulmonary hygiene -Pulmonary recommends continue mobilization as well  as continue steroids and transition to oral prednisone for 5 days -Going to get a dose of IV Lasix 40 mg x1 and Pulmonary recommending trial of Hypertonic Saline Nebs -Ambulatory Home O2 Screen done and she desaturated to 88% so she will require at least 2 L of supplemental oxygen via nasal cannula ambulating -Patient will follow-up with pulmonary after discharge and she is medically stable for discharge at this time   Mucus plug in respiratory tract -CXT and CT chest consistent with what appears to be mucous plugging in setting of URI -responded to CPT as evidenced by improvement in CXR  -Pulmonology consulted for further assistance also -Continue current treatment; pulmonology has added further treatment on as well recommending weaning off oxygen to off for sats greater than 90% -PCT negative; WBC considered due to steroid use; afebrile. At this time d/c abx and monitor clinically  -Bronchoscopy done and PGP negative on FOB and will follow-up on tracheal aspirate and culture -Continue with aggressive pulmonary hygiene, mobilization, Brovana, Pulmicort and DuoNebs as well as steroids with Solu-Medrol -Pulmonary recommends continue antitussives with guaifenesin and Hycodan  -Now Pulmonary going to start Hypertonic Saline Nebs and will give a dose of IV Lasix -CT chest showed mucous plugging with shifting atelectasis and is improved with conservative management and she required intubation for a fiberoptic bronchoscopy on 07/28/2002. -Repeat chest x-ray prior to discharge done and showed "Small right larger than left pleural effusions with bibasilar opacities, overall improved since the prior study particularly on the left. No new or worsening focal airspace disease."   Essential Hypertension -Continue Benicar-HCTZ -Continue to Monitor BP per Protocol  -Follow-up in outpatient setting   Hyperlipidemia - No statin noted on home med rec   Diabetes mellitus type 2 in obese (HCC) -HbA1c  5.8% -Continue with Moderate Novolog SSI and CBG monitoring -CBGs ranging from 107-195 and was 143 on this AM's CMP    Hypothyroidism -Continue Levothyroxine 137 mcg daily. -Checked TSH and is now 0.530    Depression and Anxiety  -C/w Citalopram 40 mg po qHS   Normocytic Anemia -Patient's Hgb/Hct went from 11.9/36.1 -> 12.2/37.7 -> 12.5/37.7 and is 13.6/39.8 at the time of discharge -Anemia panel was checked and showed an iron level of 73, UIBC of 223, TIBC of 296, saturation ratios of 25, ferritin level 201, folate level 11.7, vitamin B12 466 -Continue to Monitor for S/Sx of Bleeding; No overt bleeding noted -Repeat CBC within 1 week   Hyponatremia -Mild. Na+ went from 135 -> 134 -> 136 and slightly dropped to 134 -Continue to Monitor and Trend and repeat CMP within 1 week   Leukocytosis -Likely reactive in the setting of steroid use as she is on Methylprednisolone IV 40 mg Daily  -Patient's WBC went from 14.2 -> 14.8 -> 12.5 and at the time of discharge is 12.7 -Continue to Monitor and Trend and repeat CBC within 1 week   Obesity -Complicates overall prognosis and care -Estimated body mass index is 36.14 kg/m as calculated from the following:   Height as of this encounter: _0  (1.626 m).   Weight as of this encounter: 95.5  kg.  -Weight Loss and Dietary Counseling given  Consultants: PCCM pulmonary Procedures performed: Bronchoscopy Disposition: Home Diet recommendation:  Discharge Diet Orders (From admission, onward)     Start     Ordered   07/31/22 0000  Diet - low sodium heart healthy        07/31/22 1108   07/31/22 0000  Diet Carb Modified        07/31/22 1108           Cardiac and Carb modified diet DISCHARGE MEDICATION: Allergies as of 07/31/2022       Reactions   Atenolol    Esophageal Spasms   Minocycline Hcl    Migraines and Dizziness   Morphine Sulfate Nausea And Vomiting   Amoxicillin Rash   Did it involve swelling of the face/tongue/throat,  SOB, or low BP? No Did it involve sudden or severe rash/hives, skin peeling, or any reaction on the inside of your mouth or nose? No Did you need to seek medical attention at a hospital or doctor's office? No When did it last happen?    Childhood allergy   If all above answers are "NO", may proceed with cephalosporin use.   Penicillins Rash   Did it involve swelling of the face/tongue/throat, SOB, or low BP? No Did it involve sudden or severe rash/hives, skin peeling, or any reaction on the inside of your mouth or nose? No Did you need to seek medical attention at a hospital or doctor's office? No When did it last happen?    Childhood allergy   If all above answers are "NO", may proceed with cephalosporin use.        Medication List     STOP taking these medications    halobetasol 0.05 % ointment Commonly known as: ULTRAVATE       TAKE these medications    acetaminophen 325 MG tablet Commonly known as: TYLENOL Take 2 tablets (650 mg total) by mouth every 6 (six) hours as needed for mild pain (or Fever >/= 101).   albuterol 108 (90 Base) MCG/ACT inhaler Commonly known as: VENTOLIN HFA Inhale 2 puffs into the lungs every 6 (six) hours as needed for wheezing or shortness of breath.   albuterol (2.5 MG/3ML) 0.083% nebulizer solution Commonly known as: PROVENTIL Take 3 mLs (2.5 mg total) by nebulization every 6 (six) hours as needed for wheezing or shortness of breath.   citalopram 40 MG tablet Commonly known as: CELEXA Take 40 mg by mouth at bedtime.   estradiol 0.1 MG/24HR patch Commonly known as: VIVELLE-DOT Place 1 patch onto the skin 2 (two) times a week.   fluticasone furoate-vilanterol 200-25 MCG/ACT Aepb Commonly known as: Breo Ellipta Inhale 1 puff into the lungs daily.   guaiFENesin 600 MG 12 hr tablet Commonly known as: MUCINEX Take 1 tablet (600 mg total) by mouth 2 (two) times daily for 5 days.   HYDROcodone bit-homatropine 5-1.5 MG/5ML syrup Commonly  known as: HYCODAN Take 5 mLs by mouth every 6 (six) hours as needed for cough.   hydrOXYzine 25 MG tablet Commonly known as: ATARAX Take 25 mg by mouth at bedtime.   levothyroxine 137 MCG tablet Commonly known as: SYNTHROID Take 1 tablet (137 mcg total) by mouth daily before breakfast. Need annual appointment with labs for further refills What changed:  when to take this additional instructions   olmesartan-hydrochlorothiazide 20-12.5 MG tablet Commonly known as: BENICAR HCT Take 1 tablet by mouth daily.   ondansetron 4 MG tablet Commonly known  as: ZOFRAN Take 1 tablet (4 mg total) by mouth every 6 (six) hours as needed for nausea.   predniSONE 20 MG tablet Commonly known as: DELTASONE Take 2 tablets (40 mg total) by mouth daily for 5 days. What changed:  medication strength how much to take how to take this when to take this additional instructions   progesterone 100 MG capsule Commonly known as: PROMETRIUM Take 100 mg by mouth at bedtime as needed (sleep).        Follow-up Information     [facility] Follow up.   Why: [facility] is to follow up with you at discharge to provide home health PT               Discharge Exam: Filed Weights   07/25/22 1444 07/25/22 2100  Weight: 94.7 kg 95.5 kg   Vitals:   07/31/22 0506 07/31/22 0728  BP: (!) 140/76   Pulse: 85   Resp: (!) 21   Temp: 98.9 F (37.2 C)   SpO2: 93% 92%   Examination: Physical Exam:  Constitutional: WN/WD obese Caucasian female currently no acute distress appears calm Respiratory: Diminished to auscultation bilaterally with some coarse breath sounds with some rhonchi and crackles.  No appreciable wheezing or rales.  Normal respiratory effort and wearing supplemental oxygen via nasal cannula Cardiovascular: RRR, no murmurs / rubs / gallops. S1 and S2 auscultated.  Trace extremity edema Abdomen: Soft, non-tender, distended secondary body habitus. Bowel sounds positive.  GU:  Deferred. Musculoskeletal: No clubbing / cyanosis of digits/nails. No joint deformity upper and lower extremities.  Skin: No rashes, lesions, ulcers on limited skin evaluation. No induration; Warm and dry.  Neurologic: CN 2-12 grossly intact with no focal deficits.  Romberg sign and cerebellar reflexes not assessed.  Psychiatric: Normal judgment and insight. Alert and oriented x 3. Normal mood and appropriate affect.   Condition at discharge: stable  The results of significant diagnostics from this hospitalization (including imaging, microbiology, ancillary and laboratory) are listed below for reference.   Imaging Studies: DG CHEST PORT 1 VIEW  Result Date: 07/31/2022 CLINICAL DATA:  Shortness of breath EXAM: PORTABLE CHEST 1 VIEW COMPARISON:  Chest radiograph 1 day prior FINDINGS: Mild cardiomegaly is unchanged. The mediastinal contours are stable, allowing for differences in patient positioning. Lung volumes remain low with small bilateral pleural effusions, right larger than left, and mild bibasilar opacities. Aeration of the lung bases overall appears slightly improved compared to the prior study particularly on the left. There is no new or worsening focal airspace disease. There is no pneumothorax There is no acute osseous abnormality. IMPRESSION: Small right larger than left pleural effusions with bibasilar opacities, overall improved since the prior study particularly on the left. No new or worsening focal airspace disease. Electronically Signed   By: Valetta Mole M.D.   On: 07/31/2022 08:04   DG CHEST PORT 1 VIEW  Result Date: 07/30/2022 CLINICAL DATA:  Acute respiratory failure with hypoxia EXAM: PORTABLE CHEST 1 VIEW COMPARISON:  07/26/2022 plain film and CT of 07/27/2022 FINDINGS: The Chin overlies the apices. Reverse apical lordotic positioning. Patient rotated right. Mild cardiomegaly with tortuous thoracic aorta. Similar small bilateral pleural effusions. No pneumothorax. Low lung  volumes with resultant pulmonary interstitial prominence. No overt congestive failure. Similar left and slightly worsened right base airspace disease. IMPRESSION: Slightly worsened right base airspace disease. Persistent left base airspace disease. This could represent atelectasis or infection. Similar small bilateral pleural effusions. Cardiomegaly and low lung volumes. Electronically Signed  By: Abigail Miyamoto M.D.   On: 07/30/2022 08:11   CT CHEST WO CONTRAST  Result Date: 07/27/2022 CLINICAL DATA:  Pneumonia complication suspected; x-ray done EXAM: CT CHEST WITHOUT CONTRAST TECHNIQUE: Multidetector CT imaging of the chest was performed following the standard protocol without IV contrast. RADIATION DOSE REDUCTION: This exam was performed according to the departmental dose-optimization program which includes automated exposure control, adjustment of the mA and/or kV according to patient size and/or use of iterative reconstruction technique. COMPARISON:  Radiographs 07/26/2022 and CT 07/25/2022 FINDINGS: Cardiovascular: Normal heart size. No pericardial effusion. Coronary artery and aortic atherosclerotic calcification. Mediastinum/Nodes: No definite mediastinal or hilar adenopathy on noncontrast exam. No axillary adenopathy. Unremarkable esophagus. Lungs/Pleura: Since 07/25/2022, there is improved aeration of the right upper lobe with residual ground-glass opacities and interlobular septal thickening within the right upper lobe posteriorly. Decreased mucous plugging in the right upper lobe bronchi. New multiple mucous plugs in the right lower lobe bronchi with new atelectasis of the inferior right lower lobe. Mucous plugging in the left lower lobe with near-complete collapse of the left lower lobe is similar to slightly improved from 07/25/2022. New linear atelectasis in the lingula. No pleural effusion or pneumothorax. Upper Abdomen: No acute abnormality. Musculoskeletal: No chest wall mass or suspicious bone  lesions identified. IMPRESSION: Mucous plugging with shifting atelectasis. There is new atelectasis of the right lower lobe with similar atelectasis of the left lower lobe and improved atelectasis of the right upper lobe. Postobstructive pneumonia is difficult to exclude. No definite obstructing mass though follow-up after treatment is again recommended in 4-6 weeks. Aortic Atherosclerosis (ICD10-I70.0). Electronically Signed   By: Placido Sou M.D.   On: 07/27/2022 22:43   Portable chest 1 View  Result Date: 07/26/2022 CLINICAL DATA:  Hypoxemic respiratory failure. Follow-up right upper lobe atelectasis. EXAM: PORTABLE CHEST 1 VIEW COMPARISON:  Chest x-ray and chest CT from yesterday. FINDINGS: Interval re-aeration of the right upper lobe. Minimal residual atelectasis. Findings could have been due to mucous plugging. Persistent bibasilar infiltrates or atelectasis. IMPRESSION: 1. Interval re-aeration of the right upper lobe with minimal residual atelectasis. 2. Persistent bibasilar infiltrates or atelectasis. Electronically Signed   By: Marijo Sanes M.D.   On: 07/26/2022 09:11   CT Chest W Contrast  Result Date: 07/25/2022 CLINICAL DATA:  Right upper lobe collapse seen on radiographs earlier today; evaluate for obstructing mass EXAM: CT CHEST WITH CONTRAST TECHNIQUE: Multidetector CT imaging of the chest was performed during intravenous contrast administration. RADIATION DOSE REDUCTION: This exam was performed according to the departmental dose-optimization program which includes automated exposure control, adjustment of the mA and/or kV according to patient size and/or use of iterative reconstruction technique. CONTRAST:  49m OMNIPAQUE IOHEXOL 300 MG/ML  SOLN COMPARISON:  Radiographs earlier today FINDINGS: Cardiovascular: Normal heart size. No pericardial effusion. Aortic atherosclerotic calcification. Mediastinum/Nodes: Normal thyroid. No thoracic adenopathy by size. Unremarkable esophagus.  Lungs/Pleura: Near-complete atelectasis of the right upper lobe. Multifocal areas of mucous plugging in the right upper lobe bronchi. No definite obstructing mass. Mucous plugging in the left lower lobe bronchus with near-complete collapse of the left lower lobe. Small amount of layering secretions in the lower trachea. No pleural effusion or pneumothorax. Upper Abdomen: No acute abnormality. Musculoskeletal: No acute abnormality. Cervical spine fusion hardware. IMPRESSION: Near-complete collapse of the right upper and left lower lobes. This is favored due to mucus plug/secretions. Postobstructive pneumonia is difficult to exclude. No definite obstructing mass though follow-up after treatment in 4-6 weeks is recommended. Aortic  Atherosclerosis (ICD10-I70.0). Electronically Signed   By: Placido Sou M.D.   On: 07/25/2022 16:21   DG Chest Portable 1 View  Result Date: 07/25/2022 CLINICAL DATA:  Shortness of breath. EXAM: PORTABLE CHEST 1 VIEW COMPARISON:  12/11/2015 FINDINGS: Complete right upper lobe collapse. There is some atelectasis at the left base. No edema or pleural effusion. The cardiopericardial silhouette is within normal limits for size. The visualized bony structures of the thorax are unremarkable. IMPRESSION: Complete right upper lobe collapse. Consider dedicated CT chest with intravenous contrast material to assess for central obstructing mass lesion. Electronically Signed   By: Misty Stanley M.D.   On: 07/25/2022 15:06    Microbiology: Results for orders placed or performed during the hospital encounter of 07/25/22  Resp Panel by RT-PCR (Flu A&B, Covid) Anterior Nasal Swab     Status: None   Collection Time: 07/25/22  2:58 PM   Specimen: Anterior Nasal Swab  Result Value Ref Range Status   SARS Coronavirus 2 by RT PCR NEGATIVE NEGATIVE Final    Comment: (NOTE) SARS-CoV-2 target nucleic acids are NOT DETECTED.  The SARS-CoV-2 RNA is generally detectable in upper  respiratory specimens during the acute phase of infection. The lowest concentration of SARS-CoV-2 viral copies this assay can detect is 138 copies/mL. A negative result does not preclude SARS-Cov-2 infection and should not be used as the sole basis for treatment or other patient management decisions. A negative result may occur with  improper specimen collection/handling, submission of specimen other than nasopharyngeal swab, presence of viral mutation(s) within the areas targeted by this assay, and inadequate number of viral copies(<138 copies/mL). A negative result must be combined with clinical observations, patient history, and epidemiological information. The expected result is Negative.  Fact Sheet for Patients:  EntrepreneurPulse.com.au  Fact Sheet for Healthcare Providers:  IncredibleEmployment.be  This test is no t yet approved or cleared by the [place] FDA and  has been authorized for detection and/or diagnosis of SARS-CoV-2 by FDA under an Emergency Use Authorization (EUA). This EUA will remain  in effect (meaning this test can be used) for the duration of the COVID-19 declaration under Section 564(b)(1) of the Act, 21 U.S.C.section 360bbb-3(b)(1), unless the authorization is terminated  or revoked sooner.       Influenza A by PCR NEGATIVE NEGATIVE Final   Influenza B by PCR NEGATIVE NEGATIVE Final    Comment: (NOTE) The Xpert Xpress SARS-CoV-2/FLU/RSV plus assay is intended as an aid in the diagnosis of influenza from Nasopharyngeal swab specimens and should not be used as a sole basis for treatment. Nasal washings and aspirates are unacceptable for Xpert Xpress SARS-CoV-2/FLU/RSV testing.  Fact Sheet for Patients: EntrepreneurPulse.com.au  Fact Sheet for Healthcare Providers: IncredibleEmployment.be  This test is not yet approved or cleared by the [place] FDA and has been  authorized for detection and/or diagnosis of SARS-CoV-2 by FDA under an Emergency Use Authorization (EUA). This EUA will remain in effect (meaning this test can be used) for the duration of the COVID-19 declaration under Section 564(b)(1) of the Act, 21 U.S.C. section 360bbb-3(b)(1), unless the authorization is terminated or revoked.  Performed at [facility], [address] [place], [place] [address]   MRSA Next Gen by PCR, Nasal     Status: None   Collection Time: 07/25/22  8:52 PM   Specimen: Nasal Mucosa; Nasal Swab  Result Value Ref Range Status   MRSA by PCR Next Gen NOT DETECTED NOT DETECTED Final  Comment: (NOTE) The GeneXpert MRSA Assay (FDA approved for NASAL specimens only), is one component of a comprehensive MRSA colonization surveillance program. It is not intended to diagnose MRSA infection nor to guide or monitor treatment for MRSA infections. Test performance is not FDA approved in patients less than 88 years old. Performed at [facility], [place] [address] [place], [place] [address]   Respiratory (~20 pathogens) panel by PCR     Status: None   Collection Time: 07/25/22 10:10 PM   Specimen: Nasopharyngeal Swab; Respiratory  Result Value Ref Range Status   Adenovirus NOT DETECTED NOT DETECTED Final   Coronavirus 229E NOT DETECTED NOT DETECTED Final    Comment: (NOTE) The Coronavirus on the Respiratory Panel, DOES NOT test for the novel  Coronavirus (2019 nCoV)    Coronavirus HKU1 NOT DETECTED NOT DETECTED Final   Coronavirus NL63 NOT DETECTED NOT DETECTED Final   Coronavirus OC43 NOT DETECTED NOT DETECTED Final   Metapneumovirus NOT DETECTED NOT DETECTED Final   Rhinovirus / Enterovirus NOT DETECTED NOT DETECTED Final   Influenza A NOT DETECTED NOT DETECTED Final   Influenza B NOT DETECTED NOT DETECTED Final   Parainfluenza Virus 1 NOT DETECTED NOT DETECTED Final   Parainfluenza Virus 2 NOT DETECTED NOT DETECTED Final    Parainfluenza Virus 3 NOT DETECTED NOT DETECTED Final   Parainfluenza Virus 4 NOT DETECTED NOT DETECTED Final   Respiratory Syncytial Virus NOT DETECTED NOT DETECTED Final   Bordetella pertussis NOT DETECTED NOT DETECTED Final   Bordetella Parapertussis NOT DETECTED NOT DETECTED Final   Chlamydophila pneumoniae NOT DETECTED NOT DETECTED Final   Mycoplasma pneumoniae NOT DETECTED NOT DETECTED Final    Comment: Performed at [facility], [place]. [address], [place], [place] [address]  Expectorated Sputum Assessment w Gram Stain, Rflx to Resp Cult     Status: None   Collection Time: 07/27/22  2:28 PM   Specimen: Sputum  Result Value Ref Range Status   Specimen Description SPUTUM  Final   Special Requests NONE  Final   Sputum evaluation   Final    THIS SPECIMEN IS ACCEPTABLE FOR SPUTUM CULTURE Performed at [facility], [place] [address], [place], [place] [address]    Report Status 07/27/2022 FINAL  Final  Culture, Respiratory w Gram Stain     Status: None   Collection Time: 07/27/22  2:28 PM   Specimen: SPU  Result Value Ref Range Status   Specimen Description   Final    SPUTUM Performed at [facility] [address], [place], [place] [address]    Special Requests   Final    NONE Reflexed from [id number] Performed at [facility], [place] [address], [place], [place] [address]    Gram Stain   Final    RARE SQUAMOUS EPITHELIAL CELLS PRESENT RARE WBC PRESENT,BOTH PMN AND MONONUCLEAR MODERATE GRAM POSITIVE COCCI IN CHAINS MODERATE GRAM POSITIVE COCCI IN CLUSTERS    Culture   Final    FEW Normal respiratory flora-no Staph aureus or Pseudomonas seen Performed at [facility], [address] [place], [place] [address]    Report Status 07/30/2022 FINAL  Final  Respiratory (~20 pathogens) panel by PCR     Status: None   Collection Time: 07/28/22  9:59 AM   Specimen: Nasopharyngeal Swab; Respiratory  Result Value Ref Range  Status   Adenovirus NOT DETECTED NOT DETECTED Final   Coronavirus 229E NOT DETECTED NOT DETECTED Final  Comment: (NOTE) The Coronavirus on the Respiratory Panel, DOES NOT test for the novel  Coronavirus (2019 nCoV)    Coronavirus HKU1 NOT DETECTED NOT DETECTED Final   Coronavirus NL63 NOT DETECTED NOT DETECTED Final   Coronavirus OC43 NOT DETECTED NOT DETECTED Final   Metapneumovirus NOT DETECTED NOT DETECTED Final   Rhinovirus / Enterovirus NOT DETECTED NOT DETECTED Final   Influenza A NOT DETECTED NOT DETECTED Final   Influenza B NOT DETECTED NOT DETECTED Final   Parainfluenza Virus 1 NOT DETECTED NOT DETECTED Final   Parainfluenza Virus 2 NOT DETECTED NOT DETECTED Final   Parainfluenza Virus 3 NOT DETECTED NOT DETECTED Final   Parainfluenza Virus 4 NOT DETECTED NOT DETECTED Final   Respiratory Syncytial Virus NOT DETECTED NOT DETECTED Final   Bordetella pertussis NOT DETECTED NOT DETECTED Final   Bordetella Parapertussis NOT DETECTED NOT DETECTED Final   Chlamydophila pneumoniae NOT DETECTED NOT DETECTED Final   Mycoplasma pneumoniae NOT DETECTED NOT DETECTED Final    Comment: Performed at [facility], [place] [address] [place], [place] [address]  Culture, Respiratory w Gram Stain     Status: None   Collection Time: 07/28/22  1:40 PM   Specimen: Bronchoalveolar Lavage; Respiratory  Result Value Ref Range Status   Specimen Description   Final    BRONCHIAL ALVEOLAR LAVAGE Performed at [facility] [address] [place], [place] [address]    Special Requests   Final    NONE Performed at [facility], [place] [address], [place], [place] [address]    Gram Stain   Final    FEW WBC PRESENT, PREDOMINANTLY MONONUCLEAR NO ORGANISMS SEEN    Culture   Final    RARE Normal respiratory flora-no Staph aureus or Pseudomonas seen Performed at [facility] [address], [place], [place] [address]    Report Status 07/31/2022 FINAL   Final  Acid Fast Smear (AFB)     Status: None   Collection Time: 07/28/22  1:40 PM   Specimen: Tracheal Aspirate  Result Value Ref Range Status   AFB Specimen Processing Concentration  Final   Acid Fast Smear Negative  Final    Comment: (NOTE) Performed At: [facility] [place], [place] [id number] Rush Farmer MD [id number]    Source (AFB) BRONCHIAL ALVEOLAR LAVAGE  Final    Comment: Performed at [facility] [address], [place], [place] [address]  Pneumocystis smear by DFA     Status: None   Collection Time: 07/28/22  1:40 PM   Specimen: Bronchoalveolar Lavage; Respiratory  Result Value Ref Range Status   Specimen Source-[facility] BRONCHIAL ALVEOLAR LAVAGE  Final   Pneumocystis jiroveci Ag NEGATIVE  Final    Comment: Performed at [facility] Performed at [facility] [address] [place], [place] [address]    Labs: CBC: Recent Labs  Lab 07/27/22 0242 07/28/22 0242 07/29/22 0249 07/30/22 0252 07/31/22 0547  WBC 14.5* 14.2* 14.8* 12.5* 12.7*  NEUTROABS 12.1* 11.5* 12.2* 9.7* 9.5*  HGB 12.3 11.9* 12.2 12.5 13.6  HCT 37.8 36.1 37.7 37.7 39.8  MCV 95.5 94.3 95.2 94.5 92.8  PLT 323 312 299 320 094   Basic Metabolic Panel: Recent Labs  Lab 07/27/22 0242 07/28/22 0242 07/29/22 0249 07/30/22 0252 07/31/22 0547  NA 138 135 134* 136 134*  K 4.1 4.0 4.4 4.0 3.5  CL 102 99 99 100 96*  CO2 26 28  _0 GLUCOSE 116* 109* 102* 103* 143*  BUN 19 25* 24* 25* 29*  CREATININE 0.64 0.77 0.64 0.70 0.80  CALCIUM 8.7* 8.5* 8.4* 8.8* 9.2  MG 2.3 2.3 2.3 2.2 1.9  PHOS  --   --   --  3.8 4.0   Liver Function Tests: Recent Labs  Lab 07/26/22 0242 07/30/22 0252 07/31/22 0547  AST _1 ALT _2 ALKPHOS 54 53 51  BILITOT 0.4 0.8 0.5  PROT 6.2* 6.6 6.9  ALBUMIN 3.5 3.3* 3.5   CBG: Recent Labs  Lab 07/30/22 1541 07/30/22 1822 07/30/22 2020 07/31/22 0724 07/31/22 1125   GLUCAP 130* 118* 195* 107* 126*   Discharge time spent: greater than 30 minutes.  Signed: Raiford Noble, DO [facility] 08/01/2022

## 2022-07-31 NOTE — Progress Notes (Signed)
Mobility Specialist - Progress Note   07/31/22 0923  Oxygen Therapy  O2 Device Nasal Cannula  O2 Flow Rate (L/min) 2 L/min  Mobility  Activity Ambulated with assistance in hallway  Level of Assistance Modified independent, requires aide device or extra time  Assistive Device Other (Comment) (iv pole)  Distance Ambulated (ft) 500 ft  Activity Response Tolerated well  Mobility Referral Yes  $Mobility charge 1 Mobility   Pt received in bed and agreed to mobility with no c/o pain nor discomfort. No drop in O2 sats, pt returned to bed with all needs met.   Roderick Pee Mobility Specialist

## 2022-08-01 DIAGNOSIS — D72829 Elevated white blood cell count, unspecified: Secondary | ICD-10-CM | POA: Insufficient documentation

## 2022-08-01 DIAGNOSIS — E871 Hypo-osmolality and hyponatremia: Secondary | ICD-10-CM | POA: Insufficient documentation

## 2022-08-01 DIAGNOSIS — D649 Anemia, unspecified: Secondary | ICD-10-CM | POA: Insufficient documentation

## 2022-08-03 ENCOUNTER — Encounter: Payer: Self-pay | Admitting: Pulmonary Disease

## 2022-08-03 ENCOUNTER — Ambulatory Visit (INDEPENDENT_AMBULATORY_CARE_PROVIDER_SITE_OTHER): Payer: Medicare Other | Admitting: Pulmonary Disease

## 2022-08-03 ENCOUNTER — Telehealth: Payer: Self-pay | Admitting: Pulmonary Disease

## 2022-08-03 VITALS — BP 130/66 | HR 90 | Wt 202.2 lb

## 2022-08-03 DIAGNOSIS — J9601 Acute respiratory failure with hypoxia: Secondary | ICD-10-CM | POA: Diagnosis not present

## 2022-08-03 DIAGNOSIS — J45991 Cough variant asthma: Secondary | ICD-10-CM | POA: Diagnosis not present

## 2022-08-03 MED ORDER — BUDESONIDE 0.5 MG/2ML IN SUSP: 0.5000 mg | Freq: Two times a day (BID) | RESPIRATORY_TRACT | 2 refills | Status: DC

## 2022-08-03 MED ORDER — PREDNISONE 10 MG PO TABS: ORAL_TABLET | ORAL | 0 refills | Status: AC

## 2022-08-03 MED ORDER — ARFORMOTEROL TARTRATE 15 MCG/2ML IN NEBU: 15.0000 ug | INHALATION_SOLUTION | Freq: Two times a day (BID) | RESPIRATORY_TRACT | 2 refills | Status: DC

## 2022-08-03 NOTE — Patient Instructions (Signed)
Take prednisone as you are until runs out  Then take 20 mg for 5 days then 10 mg for 5 days  Use budesonide and arformoterol nebs twice daily - stop breo once you start this   Slowly back down on albuterol use as able if you see ongoing improvement  Oxygen saturation goal is 88% - decrease flow rate on oxygen every couple of days as able to maintain this. Check at rest and with exertion.   RTC in 4 weeks

## 2022-08-03 NOTE — Telephone Encounter (Signed)
No msg needed  close encounter disregard.Caitlin Stevenson

## 2022-08-04 ENCOUNTER — Ambulatory Visit: Payer: Medicare Other | Admitting: Pulmonary Disease

## 2022-08-05 DIAGNOSIS — I2510 Atherosclerotic heart disease of native coronary artery without angina pectoris: Secondary | ICD-10-CM | POA: Diagnosis not present

## 2022-08-05 DIAGNOSIS — J189 Pneumonia, unspecified organism: Secondary | ICD-10-CM | POA: Diagnosis not present

## 2022-08-05 DIAGNOSIS — E119 Type 2 diabetes mellitus without complications: Secondary | ICD-10-CM | POA: Diagnosis not present

## 2022-08-05 DIAGNOSIS — I700 Atherosclerosis of aorta: Secondary | ICD-10-CM | POA: Diagnosis not present

## 2022-08-05 DIAGNOSIS — E039 Hypothyroidism, unspecified: Secondary | ICD-10-CM | POA: Diagnosis not present

## 2022-08-05 DIAGNOSIS — I10 Essential (primary) hypertension: Secondary | ICD-10-CM | POA: Diagnosis not present

## 2022-08-05 DIAGNOSIS — J9601 Acute respiratory failure with hypoxia: Secondary | ICD-10-CM | POA: Diagnosis not present

## 2022-08-06 ENCOUNTER — Ambulatory Visit
Admission: RE | Admit: 2022-08-06 | Discharge: 2022-08-06 | Disposition: A | Discharge status: Home / Self Care | Payer: Medicare Other | Source: Ambulatory Visit | Attending: Obstetrics and Gynecology | Admitting: Obstetrics and Gynecology

## 2022-08-06 ENCOUNTER — Telehealth: Payer: Self-pay | Admitting: Pulmonary Disease

## 2022-08-06 DIAGNOSIS — R928 Other abnormal and inconclusive findings on diagnostic imaging of breast: Secondary | ICD-10-CM

## 2022-08-06 DIAGNOSIS — N6012 Diffuse cystic mastopathy of left breast: Secondary | ICD-10-CM | POA: Diagnosis not present

## 2022-08-06 NOTE — Telephone Encounter (Signed)
Called patient and updated her that Dr Silas Flood wants to wait until her office visit in January to make sure she does not need her oxygen anymore. He wants to make sure sh eis stable first before I send in the discontinue order for [employer]. Nothing further needed

## 2022-08-07 ENCOUNTER — Telehealth: Payer: Self-pay | Admitting: Pulmonary Disease

## 2022-08-07 NOTE — Telephone Encounter (Signed)
Called patient back and she states that she wants the oxygen picked up. She does not want to wait till her follow up.   Sir are you ok with me placing a discharge order for her oxygen  Please advise

## 2022-08-07 NOTE — Telephone Encounter (Signed)
Recommend she check her oxygen saturation at rest and with exertion. If staying above 88% while on room air let us know.

## 2022-08-10 NOTE — Telephone Encounter (Signed)
Patient is returning phone call. Patient phone number is [phone].

## 2022-08-10 NOTE — Telephone Encounter (Signed)
Spoke with pt who states O2 sats have been between 94% and 97% over the last week. Dr. Silas Flood please advise on O2 d/c order. Thank you

## 2022-08-10 NOTE — Telephone Encounter (Signed)
Called and left voicemail for patient to call office back to let me know about her oxygen saturations at rest and with exertion.

## 2022-08-10 NOTE — Progress Notes (Signed)
$'@Patient'M$  ID: Caitlin Stevenson, female    DOB: 22-Aug-1951, 71 y.o.   MRN: [id number]  Chief Complaint  Patient presents with   Acute Visit    Pt is here for acute visit and when she saw Shearon Stalls and was given breathing treatmetns, prednsione and cough medication. Went to hospital and was in ICU for 6 days. Dx was viral PNA. Xrays were done and collapsed lung? Bronch done. Pt states she is still having bronch spasms and she can not breath. She states its during the night. And wants to talk to you about it. Coughing up "fur balls" and still having issues breathing. Coughing up green balls but it does clear up during the day.     Referring provider: Reynold Bowen, MD  HPI:   71 y.o. woman whom we are seeing in hospital follow up  for pneumonia and hypoxemic respiratory failure.    Patient presented to the office recently.  Seen by Dr. Shearon Stalls, note reviewed.  Given prednisone and cough medication.  Unfortunate, symptoms worsen.  Prompted admission to the hospital.  At [facility].  For about a week.  CT scan showed mucous plugging bilaterally, a bit odd.  She went for bronc and secretions were suctioned out.  Very thick and purulent appearing.  She improved of this.  However she still required oxygen.  A lot of atelectasis on imaging.  She continues to cough.  Although the amount she is coughing up and frequency is improved.  She was also discharged on new oxygen.  She has been weaning this off at home.  Monitoring saturations.  Discussed keeping 88% or higher.  Multiple chest images, CT scans, as well as multiple pulmonary notes and discharge summary from recent hospitalization were reviewed.  HPI at initial visit: Patient with normal state of health.  Grandson contracted upper respiratory illness early to mid 2023 January.  Whole family got similar crud.  Initially dry cough..  Lingered for a couple of weeks.  Most family was including original patient, grandchild, improved.  Barky, spasmodic cough  continued.  Urgent care visit 09/26/2021.  Diagnosed with pneumonia based on chest x-ray.  Prescribed duo nebs as well as cefdinir and azithromycin.  No real improvement.  Had a course of prednisone on hand, 65432 1 pill taper.  Mild improvement in terms of severity.  Was able to sleep last night so somewhat better at night.  No other relieving or exacerbating factors.  She has taken Mucinex with minimal relief.  Dextromethorphan/promethazine cough syrup without relief.  She reports history of asthma.  Only when on nonselective beta-blocker.  When that was discontinued her asthma symptoms resolved.  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, former NP, lives in [place] / Pulmonary Flowsheets:   ACT:  Asthma Control Test ACT Total Score  07/20/2022  9:50 AM 24  02/27/2022  3:57 PM 25    MMRC:     No data to display           Epworth:      No data to display           Tests:   FENO:  Lab Results  Component Value Date   NITRICOXIDE 21 12/31/2015    PFT:     No data to display           WALK:      No data to display  Imaging: Personally reviewed  Lab Results: Personally reviewed CBC    Component Value Date/Time   WBC 12.7 (H) 07/31/2022 0547   RBC 4.29 07/31/2022 0547   HGB 13.6 07/31/2022 0547   HCT 39.8 07/31/2022 0547   PLT 307 07/31/2022 0547   MCV 92.8 07/31/2022 0547   MCH 31.7 07/31/2022 0547   MCHC 34.2 07/31/2022 0547   RDW 12.7 07/31/2022 0547   LYMPHSABS 2.2 07/31/2022 0547   MONOABS 0.9 07/31/2022 0547   EOSABS 0.1 07/31/2022 0547   BASOSABS 0.0 07/31/2022 0547    BMET    Component Value Date/Time   NA 134 (L) 07/31/2022 0547   K 3.5 07/31/2022 0547   CL 96 (L) 07/31/2022 0547   CO2 26 07/31/2022 0547   GLUCOSE 143 (H) 07/31/2022 0547   BUN 29 (H)  07/31/2022 0547   CREATININE 0.80 07/31/2022 0547   CALCIUM 9.2 07/31/2022 0547   GFRNONAA >60 07/31/2022 0547   GFRAA >60 04/18/2019 0242    BNP    Component Value Date/Time   BNP 144.9 (H) 07/25/2022 1458    ProBNP No results found for: "PROBNP"  Specialty Problems       Pulmonary Problems   Cough variant asthma vs UACS     NO 10/15/2015  = 51 - Spirometry 10/15/2015 wnl including [id number] with last saba > 8 h prior - 12/11/2015  After extensive coaching HFA effectiveness =    75% > challenge with dulera 100 2bid > stopped late April 2017 p improved off tenormin - added flutter valve 02/02/5408 with cyclical cough regimen - NO 12/31/2015   = 17 off dulera > ok to leave off  - Rec trial of gabapentin 12/31/2015 > f/u Dr Joya Gaskins at [facility] prn       Acute hypoxemic respiratory failure (HCC)   Mucus plug in respiratory tract    Allergies  Allergen Reactions   Atenolol     Esophageal Spasms   Minocycline Hcl     Migraines and Dizziness   Morphine Sulfate Nausea And Vomiting   Amoxicillin Rash    Did it involve swelling of the face/tongue/throat, SOB, or low BP? No Did it involve sudden or severe rash/hives, skin peeling, or any reaction on the inside of your mouth or nose? No Did you need to seek medical attention at a hospital or doctor's office? No When did it last happen?    Childhood allergy   If all above answers are "NO", may proceed with cephalosporin use.    Penicillins Rash    Did it involve swelling of the face/tongue/throat, SOB, or low BP? No Did it involve sudden or severe rash/hives, skin peeling, or any reaction on the inside of your mouth or nose? No Did you need to seek medical attention at a hospital or doctor's office? No When did it last happen?    Childhood allergy   If all above answers are "NO", may proceed with cephalosporin use.    Immunization History  Administered Date(s) Administered   Influenza Split 06/17/2015   Influenza,inj,Quad PF,6+ Mos  06/09/2018   Influenza-Unspecified 05/31/2018   PPD Test 09/24/2014, 02/04/2016   Pneumococcal Conjugate-13 04/17/2016   Td 09/01/2003   Tdap 04/17/2016   Zoster, Live 10/16/2003    Past Medical History:  Diagnosis Date   Allergy    Arthritis    Chronic back pain    Depression    Diabetes mellitus without complication ([place])    diet controlled   History of carpal  tunnel syndrome    Bilateral   Hypertension    Hypothyroidism    Migraine    Obese    Pneumonia    x2   Psoriasis    Sleep apnea    mouth guard    Tobacco History: Social History   Tobacco Use  Smoking Status Never  Smokeless Tobacco Never   Counseling given: Not Answered   Continue to not smoke  Outpatient Encounter Medications as of 08/03/2022  Medication Sig   acetaminophen (TYLENOL) 325 MG tablet Take 2 tablets (650 mg total) by mouth every 6 (six) hours as needed for mild pain (or Fever >/= 101).   albuterol (PROVENTIL) (2.5 MG/3ML) 0.083% nebulizer solution Take 3 mLs (2.5 mg total) by nebulization every 6 (six) hours as needed for wheezing or shortness of breath.   albuterol (VENTOLIN HFA) 108 (90 Base) MCG/ACT inhaler Inhale 2 puffs into the lungs every 6 (six) hours as needed for wheezing or shortness of breath.   arformoterol (BROVANA) 15 MCG/2ML NEBU Take 2 mLs (15 mcg total) by nebulization 2 (two) times daily.   budesonide (PULMICORT) 0.5 MG/2ML nebulizer solution Take 2 mLs (0.5 mg total) by nebulization 2 (two) times daily.   citalopram (CELEXA) 40 MG tablet Take 40 mg by mouth at bedtime.   estradiol (VIVELLE-DOT) 0.1 MG/24HR patch Place 1 patch onto the skin 2 (two) times a week.   fluticasone furoate-vilanterol (BREO ELLIPTA) 200-25 MCG/ACT AEPB Inhale 1 puff into the lungs daily.   [EXPIRED] guaiFENesin (MUCINEX) 600 MG 12 hr tablet Take 1 tablet (600 mg total) by mouth 2 (two) times daily for 5 days.   HYDROcodone bit-homatropine (HYCODAN) 5-1.5 MG/5ML syrup Take 5 mLs by mouth every 6  (six) hours as needed for cough.   hydrOXYzine (ATARAX) 25 MG tablet Take 25 mg by mouth at bedtime.   levothyroxine (SYNTHROID, LEVOTHROID) 137 MCG tablet Take 1 tablet (137 mcg total) by mouth daily before breakfast. Need annual appointment with labs for further refills (Patient taking differently: Take 137 mcg by mouth as directed. Take 1 tablet (137.5 mg) daily Mon-Fri. On Saturday Take 1/2 tablet (68.5 mg) & Hold on Sunday. Need annual appointment with labs for further refills)   olmesartan-hydrochlorothiazide (BENICAR HCT) 20-12.5 MG tablet Take 1 tablet by mouth daily.   ondansetron (ZOFRAN) 4 MG tablet Take 1 tablet (4 mg total) by mouth every 6 (six) hours as needed for nausea.   predniSONE (DELTASONE) 10 MG tablet Take 2 tablets (20 mg total) by mouth daily with breakfast for 5 days, THEN 1 tablet (10 mg total) daily with breakfast for 5 days.   progesterone (PROMETRIUM) 100 MG capsule Take 100 mg by mouth at bedtime as needed (sleep).   [DISCONTINUED] predniSONE (DELTASONE) 20 MG tablet Take 2 tablets (40 mg total) by mouth daily for 5 days.   No facility-administered encounter medications on file as of 08/03/2022.     Review of Systems  Review of Systems  N/a Physical Exam  BP 130/66 (BP Location: Left Arm, Patient Position: Sitting, Cuff Size: Normal)   Pulse 90   Wt 202 lb 3.2 oz (91.7 kg)   SpO2 92%   BMI 34.71 kg/m   Wt Readings from Last 5 Encounters:  08/03/22 202 lb 3.2 oz (91.7 kg)  07/25/22 210 lb 8.6 oz (95.5 kg)  07/20/22 208 lb 12.8 oz (94.7 kg)  06/22/22 211 lb (95.7 kg)  02/27/22 208 lb 12.8 oz (94.7 kg)    BMI Readings from Last  5 Encounters:  08/03/22 34.71 kg/m  07/28/22 36.14 kg/m  07/20/22 35.29 kg/m  06/22/22 36.22 kg/m  02/27/22 35.84 kg/m     Physical Exam General: Well-appearing, sitting in chair Eyes: EOMI, icterus Neck: Supple, no JVP Pulmonary: Normal work of breathing, no expiratory wheeze Cardiovascular: Regular in rhythm,  no murmur Abdomen: Nondistended, bowel sounds present MSK: No synovitis, no joint effusion Neuro: Normal gait, no weakness Psych: Normal mood, full affect   Assessment & Plan:   Acute hypoxemic respite failure due to pneumonia: Likely connection of viral and bacterial.  Purulent secretions on bronchoscopy.  Significant likely shunt physiology through fairly sizable atelectasis on imaging.  It is improving per her report.  Wean oxygen as tolerated at rest and with exertion for goal 88% or higher.  Cough: Productive, spasmodic.  Following clear description of viral infection.  Suspect postviral cough, reactive airways disease, asthma.  Historically with prednisone an ICS/LABA better control of cough..  Asthma: hall mark of symptoms is cough.  Historically better controlled with Airduo.  Given recent illness changed to nebulized ICS and LAMA.  Given worsening symptoms in setting of infection as above, prolonged prednisone taper prescribed today.   Return in about 4 weeks (around 08/31/2022).    Lanier Clam, MD 08/10/2022  I spent 45 minutes in the care of the patient clinically and, review of records, coordination of care.

## 2022-08-12 ENCOUNTER — Telehealth: Payer: Self-pay | Admitting: Pulmonary Disease

## 2022-08-12 DIAGNOSIS — J4541 Moderate persistent asthma with (acute) exacerbation: Secondary | ICD-10-CM

## 2022-08-12 NOTE — Telephone Encounter (Signed)
Patient would like the nurse to call regarding her oxygen.  Please call patient to discuss at [phone]

## 2022-08-12 NOTE — Telephone Encounter (Signed)
Called patient back and went over that she is wanting oxygen to be discontinued. She states she is checking her oxygen at all times and she stays in the upper 90s. Dr Silas Flood is aware and approves. Nothing further needed

## 2022-08-13 ENCOUNTER — Inpatient Hospital Stay: Payer: Medicare Other | Admitting: Primary Care

## 2022-08-18 NOTE — Telephone Encounter (Signed)
Ok per MH to discontinue oxygen. Nothing further needed

## 2022-08-18 NOTE — Telephone Encounter (Signed)
Ok to d/c oxygen

## 2022-08-19 NOTE — Telephone Encounter (Signed)
Will close encounter

## 2022-09-03 LAB — FUNGUS CULTURE WITH STAIN

## 2022-09-03 LAB — FUNGUS CULTURE RESULT

## 2022-09-03 LAB — FUNGAL ORGANISM REFLEX

## 2022-09-07 ENCOUNTER — Ambulatory Visit (INDEPENDENT_AMBULATORY_CARE_PROVIDER_SITE_OTHER): Payer: Medicare Other | Admitting: Pulmonary Disease

## 2022-09-07 ENCOUNTER — Encounter: Payer: Self-pay | Admitting: Pulmonary Disease

## 2022-09-07 VITALS — BP 114/70 | HR 69 | Temp 97.9°F | Ht 64.0 in | Wt 210.6 lb

## 2022-09-07 DIAGNOSIS — J181 Lobar pneumonia, unspecified organism: Secondary | ICD-10-CM

## 2022-09-07 NOTE — Progress Notes (Signed)
$'@Patient'F$  ID: Caitlin Stevenson, female    DOB: 1951-06-17, 72 y.o.   MRN: [id number]  Chief Complaint  Patient presents with   Follow-up    Cough pretty much gone    Referring provider: Reynold Bowen, MD  HPI:   72 y.o. woman whom we are seeing in hospital follow up  for pneumonia and hypoxemic respiratory failure.    Returns for scheduled follow-up 1 month after last visit.  Last visit was hospital follow-up for severe bronchopneumonia as well as bilaterally.  She was on oxygen at that time.  She has weaned herself off oxygen.  Noting her oxygen stayed in the high 90s.  Oxygen was returned.  Overall her cough is largely improved.  She is feeling better.  She still fatigued.  About every other day she spends a lot of time on the couch, naps.  The other day she seems to have more energy.  Feels very fatigued, lack of energy.  Denies significant shortness of breath.  We discussed strategy of gradual increase of exercises if it helps with her family as well as her fatigue.  She is able to do this at the gym, recommended treadmill, flat surface, moderate pace starting 10 to 15 minutes a day, increase as able in terms of speed as well as duration.  HPI at initial visit: Patient with normal state of health.  Grandson contracted upper respiratory illness early to mid 2023 January.  Whole family got similar crud.  Initially dry cough..  Lingered for a couple of weeks.  Most family was including original patient, grandchild, improved.  Barky, spasmodic cough continued.  Urgent care visit 09/26/2021.  Diagnosed with pneumonia based on chest x-ray.  Prescribed duo nebs as well as cefdinir and azithromycin.  No real improvement.  Had a course of prednisone on hand, 65432 1 pill taper.  Mild improvement in terms of severity.  Was able to sleep last night so somewhat better at night.  No other relieving or exacerbating factors.  She has taken Mucinex with minimal relief.  Dextromethorphan/promethazine cough  syrup without relief.  She reports history of asthma.  Only when on nonselective beta-blocker.  When that was discontinued her asthma symptoms resolved.  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, former NP, lives in [place] / Pulmonary Flowsheets:   ACT:  Asthma Control Test ACT Total Score  09/07/2022 10:11 AM 18  07/20/2022  9:50 AM 24  02/27/2022  3:57 PM 25    MMRC:     No data to display           Epworth:      No data to display           Tests:   FENO:  Lab Results  Component Value Date   NITRICOXIDE 21 12/31/2015    PFT:     No data to display           WALK:      No data to display           Imaging: Personally reviewed  Lab Results: Personally reviewed CBC    Component Value Date/Time   WBC 12.7 (H) 07/31/2022 0547   RBC 4.29 07/31/2022 0547   HGB 13.6 07/31/2022 0547   HCT 39.8 07/31/2022 0547   PLT 307 07/31/2022 0547   MCV 92.8 07/31/2022 0547   MCH 31.7 07/31/2022 0547  MCHC 34.2 07/31/2022 0547   RDW 12.7 07/31/2022 0547   LYMPHSABS 2.2 07/31/2022 0547   MONOABS 0.9 07/31/2022 0547   EOSABS 0.1 07/31/2022 0547   BASOSABS 0.0 07/31/2022 0547    BMET    Component Value Date/Time   NA 134 (L) 07/31/2022 0547   K 3.5 07/31/2022 0547   CL 96 (L) 07/31/2022 0547   CO2 26 07/31/2022 0547   GLUCOSE 143 (H) 07/31/2022 0547   BUN 29 (H) 07/31/2022 0547   CREATININE 0.80 07/31/2022 0547   CALCIUM 9.2 07/31/2022 0547   GFRNONAA >60 07/31/2022 0547   GFRAA >60 04/18/2019 0242    BNP    Component Value Date/Time   BNP 144.9 (H) 07/25/2022 1458    ProBNP No results found for: "PROBNP"  Specialty Problems       Pulmonary Problems   Cough variant asthma vs UACS     NO 10/15/2015  = 50 - Spirometry 10/15/2015 wnl including fef25-75 with last saba > 8  h prior - 12/11/2015  After extensive coaching HFA effectiveness =    75% > challenge with dulera 100 2bid > stopped late April 2017 p improved off tenormin - added flutter valve 4/54/0981 with cyclical cough regimen - NO 12/31/2015   = 17 off dulera > ok to leave off  - Rec trial of gabapentin 12/31/2015 > f/u Dr Joy[facility] G[facility]ins at [facility] prn       Acute hypoxemic respiratory failure (HCC)   Mucus plug in respiratory tract    Allergies  Allergen Reactions   Atenolol     Esophageal Spasms   Minocycline Hcl     Migraines and Dizziness   Morphine Sulfate Nausea And Vomiting   Amoxicillin Rash    Did it involve swelling of the face/tongue/throat, SOB, or low BP? No Did it involve sudden or severe rash/hives, skin peeling, or any reaction on the inside of your mouth or nose? No Did you need to seek medical attention at a hospital or doctor's office? No When did it last happen?    Childhood allergy   If all above answers are "NO", may proceed with cephalosporin use.    Penicillins Rash    Did it involve swelling of the face/tongue/throat, SOB, or low BP? No Did it involve sudden or severe rash/hives, skin peeling, or any reaction on the inside of your mouth or nose? No Did you need to seek medical attention at a hospital or doctor's office? No When did it last happen?    Childhood allergy   If all above answers are "NO", may proceed with cephalosporin use.    Immunization History  Administered Date(s) Administered   Influenza Split 06/17/2015   Influenza,inj,Quad PF,6+ Mos 06/09/2018   Influenza-Unspecified 05/31/2018   PPD Test 09/24/2014, 02/04/2016   Pneumococcal Conjugate-13 04/17/2016   Td 09/01/2003   Tdap 04/17/2016   Zoster, Live 10/16/2003    Past Medical History:  Diagnosis Date   Allergy    Arthritis    Chronic back pain    Depression    Diabetes mellitus without complication ([place])    diet controlled   History of carpal tunnel syndrome    Bilateral   Hypertension     Hypothyroidism    Migraine    Obese    Pneumonia    x2   Psoriasis    Sleep apnea    mouth guard    Tobacco History: Social History   Tobacco Use  Smoking Status Never  Smokeless Tobacco  Never   Counseling given: Not Answered   Continue to not smoke  Outpatient Encounter Medications as of 09/07/2022  Medication Sig   acetaminophen (TYLENOL) 325 MG tablet Take 2 tablets (650 mg total) by mouth every 6 (six) hours as needed for mild pain (or Fever >/= 101).   albuterol (VENTOLIN HFA) 108 (90 Base) MCG/ACT inhaler Inhale 2 puffs into the lungs every 6 (six) hours as needed for wheezing or shortness of breath.   citalopram (CELEXA) 40 MG tablet Take 40 mg by mouth at bedtime.   estradiol (VIVELLE-DOT) 0.1 MG/24HR patch Place 1 patch onto the skin 2 (two) times a week.   fluticasone furoate-vilanterol (BREO ELLIPTA) 200-25 MCG/ACT AEPB Inhale 1 puff into the lungs daily.   fluticasone furoate-vilanterol (BREO ELLIPTA) 200-25 MCG/ACT AEPB    hydrOXYzine (ATARAX) 25 MG tablet Take 25 mg by mouth at bedtime.   levothyroxine (SYNTHROID, LEVOTHROID) 137 MCG tablet Take 1 tablet (137 mcg total) by mouth daily before breakfast. Need annual appointment with labs for further refills (Patient taking differently: Take 137 mcg by mouth as directed. Take 1 tablet (137.5 mg) daily Mon-Fri. On Saturday Take 1/2 tablet (68.5 mg) & Hold on Sunday. Need annual appointment with labs for further refills)   olmesartan-hydrochlorothiazide (BENICAR HCT) 20-12.5 MG tablet Take 1 tablet by mouth daily.   ondansetron (ZOFRAN) 4 MG tablet Take 1 tablet (4 mg total) by mouth every 6 (six) hours as needed for nausea.   progesterone (PROMETRIUM) 100 MG capsule Take 100 mg by mouth at bedtime as needed (sleep).   albuterol (PROVENTIL) (2.5 MG/3ML) 0.083% nebulizer solution Take 3 mLs (2.5 mg total) by nebulization every 6 (six) hours as needed for wheezing or shortness of breath.   arformoterol (BROVANA) 15 MCG/2ML  NEBU Take 2 mLs (15 mcg total) by nebulization 2 (two) times daily.   budesonide (PULMICORT) 0.5 MG/2ML nebulizer solution Take 2 mLs (0.5 mg total) by nebulization 2 (two) times daily.   HYDROcodone bit-homatropine (HYCODAN) 5-1.5 MG/5ML syrup Take 5 mLs by mouth every 6 (six) hours as needed for cough.   No facility-administered encounter medications on file as of 09/07/2022.     Review of Systems  Review of Systems  N/a Physical Exam  BP 114/70 (BP Location: Left Arm, Cuff Size: Large)   Pulse 69   Temp 97.9 F (36.6 C) (Oral)   Ht '5\' 4"'$  (1.626 m)   Wt 210 lb 9.6 oz (95.5 kg)   SpO2 97%   BMI 36.15 kg/m   Wt Readings from Last 5 Encounters:  09/07/22 210 lb 9.6 oz (95.5 kg)  08/03/22 202 lb 3.2 oz (91.7 kg)  07/25/22 210 lb 8.6 oz (95.5 kg)  07/20/22 208 lb 12.8 oz (94.7 kg)  06/22/22 211 lb (95.7 kg)    BMI Readings from Last 5 Encounters:  09/07/22 36.15 kg/m  08/03/22 34.71 kg/m  07/28/22 36.14 kg/m  07/20/22 35.29 kg/m  06/22/22 36.22 kg/m     Physical Exam General: Well-appearing, sitting in chair Eyes: EOMI, icterus Neck: Supple, no JVP Pulmonary: Normal work of breathing, no expiratory wheeze Cardiovascular: Regular in rhythm, no murmur Abdomen: Nondistended, bowel sounds present MSK: No synovitis, no joint effusion Neuro: Normal gait, no weakness Psych: Normal mood, full affect   Assessment & Plan:   Acute hypoxemic respite failure due to pneumonia, now resolved: Likely connection of viral and bacterial.  Purulent secretions on bronchoscopy.  Significant likely shunt physiology through fairly sizable atelectasis on imaging.  It is  improving per her report.  She is weaned off in the interim since last visit.  Now resolved.  Repeat CT scan to assess for resolution, improvement in pneumonia ordered today.  Cough: Productive, spasmodic.  Following clear description of viral infection.  Suspect postviral cough, reactive airways disease, asthma.   Historically with prednisone an ICS/LABA better control of cough.  Overall improved.  Asthma: hall mark of symptoms is cough.  Historically better controlled with Airduo.  Given recent illness changed to nebulized ICS and LAMA.  She was unable to get this due to cost.  Currently on Breo.  Does not feel like it helps.  Does find that albuterol helps.  Will trial a different inhaler, HFA, will send a message to pharmacy colleagues to assess most cost effective option.  Fatigue: Suspect related to recent hospitalization, lack of stamina.  We discussed strategy of gradual increase of exercises if it helps with her family as well as her fatigue.  She is able to do this at the gym, recommended treadmill, flat surface, moderate pace starting 10 to 15 minutes a day, increase as able in terms of speed as well as duration.  If not improving in the coming 4 to 6 weeks would recommend a TSH, vitamin D level, CBC to screen for anemia.  Suspect will improve with increased activity.   Return in about 3 months (around 12/07/2022).    Lanier Clam, MD 09/07/2022  I spent 40 minutes in the care of the patient clinically and, review of records, coordination of care.

## 2022-09-07 NOTE — Patient Instructions (Signed)
Nice to see you again  I am glad you are feeling better  I ordered a repeat CT scan to see how well the pneumonia has healed  For now, continue Breo and albuterol as needed.  I will send a message to look for an alternative the Breo that is a puffer that may be of more benefit than the powder delivery.  Return to clinic in 3 months or sooner as needed with Dr. Silas Flood

## 2022-09-11 LAB — ACID FAST CULTURE WITH REFLEXED SENSITIVITIES (MYCOBACTERIA): Acid Fast Culture: NEGATIVE

## 2022-09-25 ENCOUNTER — Ambulatory Visit
Admission: RE | Admit: 2022-09-25 | Discharge: 2022-09-25 | Disposition: A | Discharge status: Home / Self Care | Payer: Medicare Other | Source: Ambulatory Visit | Attending: Pulmonary Disease | Admitting: Pulmonary Disease

## 2022-09-25 DIAGNOSIS — J189 Pneumonia, unspecified organism: Secondary | ICD-10-CM | POA: Diagnosis not present

## 2022-09-25 DIAGNOSIS — I700 Atherosclerosis of aorta: Secondary | ICD-10-CM | POA: Diagnosis not present

## 2022-09-25 DIAGNOSIS — J181 Lobar pneumonia, unspecified organism: Secondary | ICD-10-CM

## 2022-09-25 DIAGNOSIS — J479 Bronchiectasis, uncomplicated: Secondary | ICD-10-CM | POA: Diagnosis not present

## 2022-09-25 DIAGNOSIS — J439 Emphysema, unspecified: Secondary | ICD-10-CM | POA: Diagnosis not present

## 2022-09-30 NOTE — Progress Notes (Signed)
CT shows great improvement in prior infiltrates. This means the prior abnormality was pneumonia and is getting better.

## 2022-10-19 ENCOUNTER — Telehealth: Payer: Self-pay | Admitting: Pulmonary Disease

## 2022-10-19 NOTE — Telephone Encounter (Signed)
Called and spoke with patient. Gave her CT results. She verbalized understanding. Patient had a few questions she wanted to ask Dr. Silas Flood so patient made an appointment to see Dr. Silas Flood.   Nothing further needed.

## 2022-11-04 ENCOUNTER — Encounter: Payer: Self-pay | Admitting: Pulmonary Disease

## 2022-11-04 ENCOUNTER — Ambulatory Visit (INDEPENDENT_AMBULATORY_CARE_PROVIDER_SITE_OTHER): Payer: Medicare Other | Admitting: Pulmonary Disease

## 2022-11-04 ENCOUNTER — Ambulatory Visit (HOSPITAL_COMMUNITY): Payer: Medicare Other | Attending: Internal Medicine

## 2022-11-04 VITALS — BP 126/72 | HR 70 | Wt 214.8 lb

## 2022-11-04 DIAGNOSIS — I288 Other diseases of pulmonary vessels: Secondary | ICD-10-CM | POA: Diagnosis not present

## 2022-11-04 DIAGNOSIS — R0609 Other forms of dyspnea: Secondary | ICD-10-CM | POA: Diagnosis not present

## 2022-11-04 LAB — ECHOCARDIOGRAM COMPLETE
Area-P 1/2: 3.53 cm2
S' Lateral: 2.9 cm
Weight: 3436.8 oz

## 2022-11-04 MED ORDER — BREZTRI AEROSPHERE 160-9-4.8 MCG/ACT IN AERO: 2.0000 | INHALATION_SPRAY | Freq: Two times a day (BID) | RESPIRATORY_TRACT | 0 refills | Status: DC

## 2022-11-04 NOTE — Patient Instructions (Signed)
Nice to see you again  The enlarged pulmonary arteries are nonspecific but can be a sign of something called pulmonary hypertension.  To further evaluate this I ordered an echocardiogram.  If everything looks good on that, nothing further to worry about.  Lets try slightly different inhaler to see if it helps with the wheezing etc.  Suspect this is worsening with the emerging pollens.  Try Breztri 2 puffs twice a day every day.  Rinse her mouth out with every use.  I provided samples.  Each sample inhaler last 7 days.  There is a dose counter on the top.  You find it is helpful, we can explore more cost effective or the most cost effective inhaler that is an HFA like this as opposed to the dry powder inhaler like Breo.  Return to clinic in 3 months or sooner as needed with Dr. Silas Flood

## 2022-11-04 NOTE — Addendum Note (Signed)
Addended by: Retia Passe on: 11/04/2022 03:57 PM   Modules accepted: Orders

## 2022-11-04 NOTE — Progress Notes (Signed)
$'@Patient'J$  ID: Caitlin Stevenson, female    DOB: [birth year], 72 y.o.   MRN: [id number]  Chief Complaint  Patient presents with 72 y.o.   Follow-up    Pt is here for (72 y.o.) follow up for PNA. Pt states that she is not telling a difference with Breo. Albuterol as needed     Referring provider: Reynold Bowen, MD  HPI:   72 y.o. woman whom we are seeing in follow up, prolonged hospitalization with severe pneumonia fall 2023.  Overall doing well.  Remains off oxygen..  Reviewed CT scan performed 08/2022.  This demonstrates marked improvement in prior infiltrates with evolving healing, unclear if will cause scarring or not.  Reviewed other results such as lymph node enlargement, enlarged pulmonary arteries.  Plan as below.  She is having some wheezing occasionally.  Use albuterol every day now last few days.  Likely emerging polyps as the culprit.  Breo does not seem very effective.  Discussed alternatives as below.   HPI at initial visit: Returns for scheduled follow-up 1 month after last visit.  Last visit was hospital follow-up for severe bronchopneumonia as well as bilaterally.  She was on oxygen at that time.  She has weaned herself off oxygen.  Noting her oxygen stayed in the high 90s.  Oxygen was returned.  Overall her cough is largely improved.  She is feeling better.  She still fatigued.  About every other day she spends a lot of time on the couch, naps.  The other day she seems to have more energy.  Feels very fatigued, lack of energy.  Denies significant shortness of breath.  We discussed strategy of gradual increase of exercises if it helps with her family as well as her fatigue.  She is able to do this at the gym, recommended treadmill, flat surface, moderate pace starting 10 to 15 minutes a day, increase as able in terms of speed as well as duration.  HPI at initial visit: Patient with normal state of health.  Grandson contracted upper respiratory illness early to mid 2023 January.  Whole family got  similar crud.  Initially dry cough..  Lingered for a couple of weeks.  Most family was including original patient, grandchild, improved.  Barky, spasmodic cough continued.  Urgent care visit 09/26/2021.  Diagnosed with pneumonia based on chest x-ray.  Prescribed duo nebs as well as cefdinir and azithromycin.  No real improvement.  Had a course of prednisone on hand, 65432 1 pill taper.  Mild improvement in terms of severity.  Was able to sleep last night so somewhat better at night.  No other relieving or exacerbating factors.  She has taken Mucinex with minimal relief.  Dextromethorphan/promethazine cough syrup without relief.  She reports history of asthma.  Only when on nonselective beta-blocker.  When that was discontinued her asthma symptoms resolved.  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, former NP, lives in [place] / Pulmonary Flowsheets:   ACT:  Asthma Control Test ACT Total Score  09/07/2022 10:11 AM 18  07/20/2022  9:50 AM 24  02/27/2022  3:57 PM 25    MMRC:     No data to display           Epworth:      No data to display           Tests:   FENO:  Lab Results  Component Value Date   NITRICOXIDE  21 12/31/2015    PFT:     No data to display           WALK:      No data to display           Imaging: Personally reviewed  Lab Results: Personally reviewed CBC    Component Value Date/Time   WBC 12.7 (H) 07/31/2022 0547   RBC 4.29 07/31/2022 0547   HGB 13.6 07/31/2022 0547   HCT 39.8 07/31/2022 0547   PLT 307 07/31/2022 0547   MCV 92.8 07/31/2022 0547   MCH 31.7 07/31/2022 0547   MCHC 34.2 07/31/2022 0547   RDW 12.7 07/31/2022 0547   LYMPHSABS 2.2 07/31/2022 0547   MONOABS 0.9 07/31/2022 0547   EOSABS 0.1 07/31/2022 0547   BASOSABS 0.0 07/31/2022 0547    BMET     Component Value Date/Time   NA 134 (L) 07/31/2022 0547   K 3.5 07/31/2022 0547   CL 96 (L) 07/31/2022 0547   CO2 26 07/31/2022 0547   GLUCOSE 143 (H) 07/31/2022 0547   BUN 29 (H) 07/31/2022 0547   CREATININE 0.80 07/31/2022 0547   CALCIUM 9.2 07/31/2022 0547   GFRNONAA >60 07/31/2022 0547   GFRAA >60 04/18/2019 0242    BNP    Component Value Date/Time   BNP 144.9 (H) 07/25/2022 1458    ProBNP No results found for: "PROBNP"  Specialty Problems       Pulmonary Problems   Cough variant asthma vs UACS     NO 10/15/2015  = 50 - Spirometry 10/15/2015 wnl including fef25-75 with last saba > 8 h prior - 12/11/2015  After extensive coaching HFA effectiveness =    75% > challenge with dulera 100 2bid > stopped late April 2017 p improved off tenormin - added flutter valve [id number] with cyclical cough regimen - NO 12/31/2015   = 17 off dulera > ok to leave off  - Rec trial of gabapentin 12/31/2015 > f/u Dr Joya Gaskins at [facility] prn       Acute hypoxemic respiratory failure (HCC)   Mucus plug in respiratory tract    Allergies  Allergen Reactions   Atenolol     Esophageal Spasms   Minocycline Hcl     Migraines and Dizziness   Morphine Sulfate Nausea And Vomiting   Amoxicillin Rash    Did it involve swelling of the face/tongue/throat, SOB, or low BP? No Did it involve sudden or severe rash/hives, skin peeling, or any reaction on the inside of your mouth or nose? No Did you need to seek medical attention at a hospital or doctor's office? No When did it last happen?    Childhood allergy   If all above answers are "NO", may proceed with cephalosporin use.    Penicillins Rash    Did it involve swelling of the face/tongue/throat, SOB, or low BP? No Did it involve sudden or severe rash/hives, skin peeling, or any reaction on the inside of your mouth or nose? No Did you need to seek medical attention at a hospital or doctor's office? No When did it last happen?    Childhood allergy   If all  above answers are "NO", may proceed with cephalosporin use.    Immunization History  Administered Date(s) Administered   Influenza Split 06/17/2015   Influenza,inj,Quad PF,6+ Mos 06/09/2018   Influenza-Unspecified 05/31/2018   PPD Test 09/24/2014, 02/04/2016   Pneumococcal Conjugate-13 04/17/2016   Td 09/01/2003   Tdap 04/17/2016   Zoster, Live 10/16/2003  Past Medical History:  Diagnosis Date   Allergy    Arthritis    Chronic back pain    Depression    Diabetes mellitus without complication (HCC)    diet controlled   History of carpal tunnel syndrome    Bilateral   Hypertension    Hypothyroidism    Migraine    Obese    Pneumonia    x2   Psoriasis    Sleep apnea    mouth guard    Tobacco History: Social History   Tobacco Use  Smoking Status Never  Smokeless Tobacco Never   Counseling given: Not Answered   Continue to not smoke  Outpatient Encounter Medications as of 11/04/2022  Medication Sig   acetaminophen (TYLENOL) 325 MG tablet Take 2 tablets (650 mg total) by mouth every 6 (six) hours as needed for mild pain (or Fever >/= 101).   albuterol (VENTOLIN HFA) 108 (90 Base) MCG/ACT inhaler Inhale 2 puffs into the lungs every 6 (six) hours as needed for wheezing or shortness of breath.   citalopram (CELEXA) 40 MG tablet Take 40 mg by mouth at bedtime.   estradiol (VIVELLE-DOT) 0.1 MG/24HR patch Place 1 patch onto the skin 2 (two) times a week.   fluticasone furoate-vilanterol (BREO ELLIPTA) 200-25 MCG/ACT AEPB Inhale 1 puff into the lungs daily.   fluticasone furoate-vilanterol (BREO ELLIPTA) 200-25 MCG/ACT AEPB    hydrOXYzine (ATARAX) 25 MG tablet Take 25 mg by mouth at bedtime.   levothyroxine (SYNTHROID, LEVOTHROID) 137 MCG tablet Take 1 tablet (137 mcg total) by mouth daily before breakfast. Need annual appointment with labs for further refills (Patient taking differently: Take 137 mcg by mouth as directed. Take 1 tablet (137.5 mg) daily Mon-Fri. On Saturday  Take 1/2 tablet (68.5 mg) & Hold on Sunday. Need annual appointment with labs for further refills)   olmesartan-hydrochlorothiazide (BENICAR HCT) 20-12.5 MG tablet Take 1 tablet by mouth daily.   ondansetron (ZOFRAN) 4 MG tablet Take 1 tablet (4 mg total) by mouth every 6 (six) hours as needed for nausea.   progesterone (PROMETRIUM) 100 MG capsule Take 100 mg by mouth at bedtime as needed (sleep).   No facility-administered encounter medications on file as of 11/04/2022.     Review of Systems  Review of Systems  N/a Physical Exam  BP 126/72 (BP Location: Left Arm, Patient Position: Sitting, Cuff Size: Normal)   Pulse 70   Wt 214 lb 12.8 oz (97.4 kg)   SpO2 96%   BMI 36.87 kg/m   Wt Readings from Last 5 Encounters:  11/04/22 214 lb 12.8 oz (97.4 kg)  09/07/22 210 lb 9.6 oz (95.5 kg)  08/03/22 202 lb 3.2 oz (91.7 kg)  07/25/22 210 lb 8.6 oz (95.5 kg)  07/20/22 208 lb 12.8 oz (94.7 kg)    BMI Readings from Last 5 Encounters:  11/04/22 36.87 kg/m  09/07/22 36.15 kg/m  08/03/22 34.71 kg/m  07/28/22 36.14 kg/m  07/20/22 35.29 kg/m     Physical Exam General: Well-appearing, sitting in chair Eyes: EOMI, icterus Neck: Supple, no JVP Pulmonary: Normal work of breathing, no expiratory wheeze Cardiovascular: Regular in rhythm, no murmur Abdomen: Nondistended, bowel sounds present MSK: No synovitis, no joint effusion Neuro: Normal gait, no weakness Psych: Normal mood, full affect   Assessment & Plan:   Acute hypoxemic respite failure due to pneumonia, now resolved: Likely connection of viral and bacterial.  Purulent secretions on bronchoscopy.  Significant likely shunt physiology through fairly sizable atelectasis on imaging.  It is improving per her report.  She is weaned off in the interim since last visit.  Now resolved.    Cough: Productive, spasmodic.  Following clear description of viral infection.  Suspect postviral cough, reactive airways disease, asthma.   Historically with prednisone an ICS/LABA better control of cough.  Previously seemed improved.  Unclear if Memory Dance is helping as much now.  Asthma: hall mark of symptoms is cough.  Historically better controlled with Airduo.  Given recent illness changed to nebulized ICS and LAMA.  She was unable to get this due to cost.  Most recently on Breo, does not feel like it helps.  Does find that albuterol helps.  Trial of Breztri samples today, may be better covered now in a new year.  If helps can explore if this is an option financially through her insurance versus de-escalate ICS/LABA HFA.  DPI's have seemed ineffective in the past.  Fatigue: Suspect related to recent hospitalization, lack of stamina.  We discussed strategy of gradual increase of exercises if it helps with her family as well as her fatigue.  She is able to do this at the gym, recommended treadmill, flat surface, moderate pace starting 10 to 15 minutes a day, increase as able in terms of speed as well as duration.  If not improving in the coming 4 to 6 weeks would recommend a TSH, vitamin D level, CBC to screen for anemia.  Suspect will improve with increased activity.  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  On my measuring pulmonary artery seems smaller albeit slightly than the adjacent aorta.  Unclear if this truly enlarged.  Echocardiogram ordered for further evaluation.  Return in about 3 months (around 02/04/2023).    Lanier Clam, MD 11/04/2022  I spent 40 minutes in the care of the patient clinically and, review of records, coordination of care.

## 2022-11-05 NOTE — Progress Notes (Signed)
Echocardiogram is largely normal - trial leak of mitral valve. No signs of pulmonary HTN. Please let her know. Thanks!

## 2022-11-18 DIAGNOSIS — N958 Other specified menopausal and perimenopausal disorders: Secondary | ICD-10-CM | POA: Diagnosis not present

## 2022-11-18 DIAGNOSIS — E039 Hypothyroidism, unspecified: Secondary | ICD-10-CM | POA: Diagnosis not present

## 2022-11-18 DIAGNOSIS — R29890 Loss of height: Secondary | ICD-10-CM | POA: Diagnosis not present

## 2022-11-19 DIAGNOSIS — M5416 Radiculopathy, lumbar region: Secondary | ICD-10-CM | POA: Diagnosis not present

## 2022-11-19 DIAGNOSIS — M4316 Spondylolisthesis, lumbar region: Secondary | ICD-10-CM | POA: Diagnosis not present

## 2022-12-15 ENCOUNTER — Other Ambulatory Visit: Payer: Self-pay | Admitting: Family

## 2022-12-15 DIAGNOSIS — N644 Mastodynia: Secondary | ICD-10-CM | POA: Diagnosis not present

## 2022-12-15 DIAGNOSIS — M5416 Radiculopathy, lumbar region: Secondary | ICD-10-CM | POA: Diagnosis not present

## 2022-12-30 ENCOUNTER — Ambulatory Visit
Admission: RE | Admit: 2022-12-30 | Discharge: 2022-12-30 | Disposition: A | Discharge status: Home / Self Care | Payer: Medicare Other | Source: Ambulatory Visit | Attending: Family | Admitting: Family

## 2022-12-30 DIAGNOSIS — N644 Mastodynia: Secondary | ICD-10-CM

## 2022-12-30 DIAGNOSIS — Z79890 Hormone replacement therapy (postmenopausal): Secondary | ICD-10-CM | POA: Diagnosis not present

## 2023-01-05 ENCOUNTER — Telehealth: Payer: Self-pay

## 2023-01-05 ENCOUNTER — Other Ambulatory Visit (HOSPITAL_COMMUNITY): Payer: Self-pay

## 2023-01-05 NOTE — Telephone Encounter (Signed)
Patient Advocate Encounter   Received notification from [facility] that prior authorization is required for Budesonide 0.5MG /2ML suspension   Submitted: n/a Key [id number]  PA not submitted at this time.

## 2023-01-05 NOTE — Telephone Encounter (Signed)
Patient Advocate Encounter   Received notification from [facility] that prior authorization is required for Arformoterol Tartrate 15MCG/2ML nebulizer solution   Submitted: n/a Key [id number]  PA not submitted at this time.

## 2023-01-05 NOTE — Telephone Encounter (Signed)
I called and spoke with the  pt She states that she does not want to use neb She is doing well on Breo and albuterol  She states the request for these nebs was made months ago in the hospital  Will not proceed with PA or new rx   I scheduled her appt with Dr Hunsucker for 02/10/23  Nothing further needed 

## 2023-01-05 NOTE — Telephone Encounter (Signed)
I called and spoke with the  pt She states that she does not want to use neb She is doing well on Breo and albuterol  She states the request for these nebs was made months ago in the hospital  Will not proceed with PA or new rx   I scheduled her appt with Dr Judeth Horn for 02/10/23  Nothing further needed

## 2023-01-06 DIAGNOSIS — M4316 Spondylolisthesis, lumbar region: Secondary | ICD-10-CM | POA: Diagnosis not present

## 2023-01-06 DIAGNOSIS — M5416 Radiculopathy, lumbar region: Secondary | ICD-10-CM | POA: Diagnosis not present

## 2023-01-06 DIAGNOSIS — M546 Pain in thoracic spine: Secondary | ICD-10-CM | POA: Diagnosis not present

## 2023-01-07 ENCOUNTER — Other Ambulatory Visit: Payer: Self-pay | Admitting: *Deleted

## 2023-01-07 DIAGNOSIS — M546 Pain in thoracic spine: Secondary | ICD-10-CM

## 2023-01-28 DIAGNOSIS — N8110 Cystocele, unspecified: Secondary | ICD-10-CM | POA: Diagnosis not present

## 2023-02-03 ENCOUNTER — Other Ambulatory Visit: Payer: Medicare Other

## 2023-02-10 ENCOUNTER — Ambulatory Visit (INDEPENDENT_AMBULATORY_CARE_PROVIDER_SITE_OTHER): Payer: Medicare Other | Admitting: Pulmonary Disease

## 2023-02-10 ENCOUNTER — Encounter: Payer: Self-pay | Admitting: Pulmonary Disease

## 2023-02-10 VITALS — BP 118/60 | HR 59 | Temp 97.9°F | Ht 64.0 in | Wt 212.8 lb

## 2023-02-10 DIAGNOSIS — J4540 Moderate persistent asthma, uncomplicated: Secondary | ICD-10-CM | POA: Diagnosis not present

## 2023-02-10 DIAGNOSIS — R052 Subacute cough: Secondary | ICD-10-CM

## 2023-02-10 NOTE — Patient Instructions (Signed)
Nice to see you again  I am glad things are doing okay off the [facility]  If you find you need to resume the Breo I recommend to continue it for at least a couple weeks at the time or longer if needed for symptoms  Continue albuterol as needed  Return to clinic in 6 months or sooner as needed with Dr. Judeth Horn

## 2023-02-10 NOTE — Progress Notes (Signed)
@Patient  ID: Caitlin Stevenson, female    DOB: [birth year], 72 y.o.   MRN: [id number]  Chief Complaint  Patient presents with   Follow-up    Breathing is much improved since her last visit. She has frequent throat clearing- relates to allergies. She stopped Breo approx 2 wks ago and is using her albuterol 2 x daily.     Referring provider: Adrian Prince, MD  HPI:   72 y.o. woman whom we are seeing in follow up, prolonged hospitalization with severe pneumonia fall 2023.  Overall doing well.  Remains off oxygen.  Cough was a problem last visit.  Seems improved.  Stop Breo couple weeks ago.  Cough remains improved.  Uses albuterol as needed.  Overall satisfied with control of symptoms.  HPI at initial visit: Returns for scheduled follow-up 1 month after last visit.  Last visit was hospital follow-up for severe bronchopneumonia as well as bilaterally.  She was on oxygen at that time.  She has weaned herself off oxygen.  Noting her oxygen stayed in the high 90s.  Oxygen was returned.  Overall her cough is largely improved.  She is feeling better.  She still fatigued.  About every other day she spends a lot of time on the couch, naps.  The other day she seems to have more energy.  Feels very fatigued, lack of energy.  Denies significant shortness of breath.  We discussed strategy of gradual increase of exercises if it helps with her family as well as her fatigue.  She is able to do this at the gym, recommended treadmill, flat surface, moderate pace starting 10 to 15 minutes a day, increase as able in terms of speed as well as duration.  HPI at initial visit: Patient with normal state of health.  Grandson contracted upper respiratory illness early to mid 2023 January.  Whole family got similar crud.  Initially dry cough..  Lingered for a couple of weeks.  Most family was including original patient, grandchild, improved.  Barky, spasmodic cough continued.  Urgent care visit 09/26/2021.  Diagnosed with  pneumonia based on chest x-ray.  Prescribed duo nebs as well as cefdinir and azithromycin.  No real improvement.  Had a course of prednisone on hand, 40981 1 pill taper.  Mild improvement in terms of severity.  Was able to sleep last night so somewhat better at night.  No other relieving or exacerbating factors.  She has taken Mucinex with minimal relief.  Dextromethorphan/promethazine cough syrup without relief.  She reports history of asthma.  Only when on nonselective beta-blocker.  When that was discontinued her asthma symptoms resolved.  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, former NP, lives in [place] / Pulmonary Flowsheets:   ACT:  Asthma Control Test ACT Total Score  09/07/2022 10:11 AM 18  07/20/2022  9:50 AM 24  02/27/2022  3:57 PM 25    MMRC:     No data to display           Epworth:      No data to display           Tests:   FENO:  Lab Results  Component Value Date   NITRICOXIDE 21 12/31/2015    PFT:     No data to display           WALK:      No data to display  Imaging: Personally reviewed  Lab Results: Personally reviewed CBC    Component Value Date/Time   WBC 12.7 (H) 07/31/2022 0547   RBC 4.29 07/31/2022 0547   HGB 13.6 07/31/2022 0547   HCT 39.8 07/31/2022 0547   PLT 307 07/31/2022 0547   MCV 92.8 07/31/2022 0547   MCH 31.7 07/31/2022 0547   MCHC 34.2 07/31/2022 0547   RDW 12.7 07/31/2022 0547   LYMPHSABS 2.2 07/31/2022 0547   MONOABS 0.9 07/31/2022 0547   EOSABS 0.1 07/31/2022 0547   BASOSABS 0.0 07/31/2022 0547    BMET    Component Value Date/Time   NA 134 (L) 07/31/2022 0547   K 3.5 07/31/2022 0547   CL 96 (L) 07/31/2022 0547   CO2 26 07/31/2022 0547   GLUCOSE 143 (H) 07/31/2022 0547   BUN 29 (H) 07/31/2022 0547   CREATININE 0.80  07/31/2022 0547   CALCIUM 9.2 07/31/2022 0547   GFRNONAA >60 07/31/2022 0547   GFRAA >60 04/18/2019 0242    BNP    Component Value Date/Time   BNP 144.9 (H) 07/25/2022 1458    ProBNP No results found for: "PROBNP"  Specialty Problems       Pulmonary Problems   ASTHMA    Qualifier: Diagnosis of  By: Claiborne Billings CMA, Jacqualynn   Replacing diagnoses that were inactivated after the 11/30/22 regulatory import      Cough variant asthma vs UACS     NO 10/15/2015  = 51 - Spirometry 10/15/2015 wnl including [id number] with last saba > 8 h prior - 12/11/2015  After extensive coaching HFA effectiveness =    75% > challenge with dulera 100 2bid > stopped late April 2017 p improved off tenormin - added flutter valve 12/11/2015 with cyclical cough regimen - NO 12/31/2015   = 18 off dulera > ok to leave off  - Rec trial of gabapentin 12/31/2015 > f/u Dr Delford Field at [facility] prn       Acute hypoxemic respiratory failure (HCC)   Mucus plug in respiratory tract    Allergies  Allergen Reactions   Atenolol     Esophageal Spasms   Minocycline Hcl     Migraines and Dizziness   Morphine Sulfate Nausea And Vomiting   Amoxicillin Rash    Did it involve swelling of the face/tongue/throat, SOB, or low BP? No Did it involve sudden or severe rash/hives, skin peeling, or any reaction on the inside of your mouth or nose? No Did you need to seek medical attention at a hospital or doctor's office? No When did it last happen?    Childhood allergy   If all above answers are "NO", may proceed with cephalosporin use.    Penicillins Rash    Did it involve swelling of the face/tongue/throat, SOB, or low BP? No Did it involve sudden or severe rash/hives, skin peeling, or any reaction on the inside of your mouth or nose? No Did you need to seek medical attention at a hospital or doctor's office? No When did it last happen?    Childhood allergy   If all above answers are "NO", may proceed with cephalosporin use.     Immunization History  Administered Date(s) Administered   Influenza Split 06/17/2015   Influenza,inj,Quad PF,6+ Mos 06/09/2018   Influenza-Unspecified 05/31/2018   PPD Test 09/24/2014, 02/04/2016   Pneumococcal Conjugate-13 04/17/2016   Td 09/01/2003   Tdap 04/17/2016   Zoster, Live 10/16/2003    Past Medical History:  Diagnosis Date   Allergy  Arthritis    Chronic back pain    Depression    Diabetes mellitus without complication (HCC)    diet controlled   History of carpal tunnel syndrome    Bilateral   Hypertension    Hypothyroidism    Migraine    Obese    Pneumonia    x2   Psoriasis    Sleep apnea    mouth guard    Tobacco History: Social History   Tobacco Use  Smoking Status Never  Smokeless Tobacco Never   Counseling given: Not Answered   Continue to not smoke  Outpatient Encounter Medications as of 02/10/2023  Medication Sig   albuterol (VENTOLIN HFA) 108 (90 Base) MCG/ACT inhaler Inhale 2 puffs into the lungs every 6 (six) hours as needed for wheezing or shortness of breath.   citalopram (CELEXA) 40 MG tablet Take 40 mg by mouth at bedtime.   estradiol (VIVELLE-DOT) 0.1 MG/24HR patch Place 1 patch onto the skin 2 (two) times a week.   fluticasone furoate-vilanterol (BREO ELLIPTA) 200-25 MCG/ACT AEPB    levothyroxine (SYNTHROID, LEVOTHROID) 137 MCG tablet Take 1 tablet (137 mcg total) by mouth daily before breakfast. Need annual appointment with labs for further refills (Patient taking differently: Take 137 mcg by mouth as directed. Take 1 tablet (137.5 mg) daily Mon-Fri. On Saturday Take 1/2 tablet (68.5 mg) & Hold on Sunday. Need annual appointment with labs for further refills)   olmesartan-hydrochlorothiazide (BENICAR HCT) 20-12.5 MG tablet Take 1 tablet by mouth daily.   ondansetron (ZOFRAN) 4 MG tablet Take 1 tablet (4 mg total) by mouth every 6 (six) hours as needed for nausea.   progesterone (PROMETRIUM) 100 MG capsule Take 100 mg by mouth  at bedtime as needed (sleep).   [DISCONTINUED] acetaminophen (TYLENOL) 325 MG tablet Take 2 tablets (650 mg total) by mouth every 6 (six) hours as needed for mild pain (or Fever >/= 101).   [DISCONTINUED] Budeson-Glycopyrrol-Formoterol (BREZTRI AEROSPHERE) 160-9-4.8 MCG/ACT AERO Inhale 2 puffs into the lungs in the morning and at bedtime.   [DISCONTINUED] fluticasone furoate-vilanterol (BREO ELLIPTA) 200-25 MCG/ACT AEPB Inhale 1 puff into the lungs daily.   [DISCONTINUED] hydrOXYzine (ATARAX) 25 MG tablet Take 25 mg by mouth at bedtime.   No facility-administered encounter medications on file as of 02/10/2023.     Review of Systems  Review of Systems  N/a Physical Exam  BP 118/60 (BP Location: Left Arm, Cuff Size: Normal)   Pulse (!) 59   Temp 97.9 F (36.6 C) (Oral)   Ht 5\' 4"  (1.626 m)   Wt 212 lb 12.8 oz (96.5 kg)   SpO2 97% Comment: on RA  BMI 36.53 kg/m   Wt Readings from Last 5 Encounters:  02/10/23 212 lb 12.8 oz (96.5 kg)  11/04/22 214 lb 12.8 oz (97.4 kg)  09/07/22 210 lb 9.6 oz (95.5 kg)  08/03/22 202 lb 3.2 oz (91.7 kg)  07/25/22 210 lb 8.6 oz (95.5 kg)    BMI Readings from Last 5 Encounters:  02/10/23 36.53 kg/m  11/04/22 36.87 kg/m  09/07/22 36.15 kg/m  08/03/22 34.71 kg/m  07/28/22 36.14 kg/m     Physical Exam General: Well-appearing, sitting in chair Eyes: EOMI, icterus Neck: Supple, no JVP Pulmonary: Normal work of breathing, no expiratory wheeze Cardiovascular: Regular in rhythm, no murmur Abdomen: Nondistended, bowel sounds present MSK: No synovitis, no joint effusion Neuro: Normal gait, no weakness Psych: Normal mood, full affect   Assessment & Plan:   Cough: Productive, spasmodic.  Following  clear description of viral infection.  Suspect postviral cough, reactive airways disease, asthma.  Historically with prednisone an ICS/LABA better control of cough.  Improved again, using Breo sparingly.  Asthma: hall mark of symptoms is cough.   Historically better controlled with Airduo.  Given recent illness changed to nebulized ICS and LAMA.  She was unable to get this due to cost.   Does find that albuterol helps.  Symptoms better controlled now.  Using Breo sparingly.  Continue to use this as needed at least 2 weeks at a time based on symptoms, continue albuterol as needed.  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  On my measuring pulmonary artery seems smaller albeit slightly than the adjacent aorta.  Unclear if this truly enlarged.  Echocardiogram ordered for further demonstrates no signs or concerns for pulmonary hypertension.  Return in about 6 months (around 08/12/2023).    Karren Burly, MD 02/10/2023

## 2023-03-14 ENCOUNTER — Ambulatory Visit
Admission: RE | Admit: 2023-03-14 | Discharge: 2023-03-14 | Disposition: A | Discharge status: Home / Self Care | Payer: Medicare Other | Source: Ambulatory Visit | Attending: *Deleted | Admitting: *Deleted

## 2023-03-14 DIAGNOSIS — M5124 Other intervertebral disc displacement, thoracic region: Secondary | ICD-10-CM | POA: Diagnosis not present

## 2023-03-14 DIAGNOSIS — M4804 Spinal stenosis, thoracic region: Secondary | ICD-10-CM | POA: Diagnosis not present

## 2023-03-14 DIAGNOSIS — M546 Pain in thoracic spine: Secondary | ICD-10-CM

## 2023-03-14 DIAGNOSIS — M419 Scoliosis, unspecified: Secondary | ICD-10-CM | POA: Diagnosis not present

## 2023-04-06 DIAGNOSIS — H40013 Open angle with borderline findings, low risk, bilateral: Secondary | ICD-10-CM | POA: Diagnosis not present

## 2023-04-08 DIAGNOSIS — N819 Female genital prolapse, unspecified: Secondary | ICD-10-CM | POA: Diagnosis not present

## 2023-04-13 DIAGNOSIS — M4316 Spondylolisthesis, lumbar region: Secondary | ICD-10-CM | POA: Diagnosis not present

## 2023-04-13 DIAGNOSIS — M546 Pain in thoracic spine: Secondary | ICD-10-CM | POA: Diagnosis not present

## 2023-04-13 DIAGNOSIS — M5416 Radiculopathy, lumbar region: Secondary | ICD-10-CM | POA: Diagnosis not present

## 2023-04-15 DIAGNOSIS — N812 Incomplete uterovaginal prolapse: Secondary | ICD-10-CM | POA: Diagnosis not present

## 2023-04-20 DIAGNOSIS — M5416 Radiculopathy, lumbar region: Secondary | ICD-10-CM | POA: Diagnosis not present

## 2023-05-27 DIAGNOSIS — M5416 Radiculopathy, lumbar region: Secondary | ICD-10-CM | POA: Diagnosis not present

## 2023-06-03 ENCOUNTER — Other Ambulatory Visit: Payer: Self-pay | Admitting: Rehabilitation

## 2023-06-03 DIAGNOSIS — M5416 Radiculopathy, lumbar region: Secondary | ICD-10-CM

## 2023-06-10 DIAGNOSIS — E785 Hyperlipidemia, unspecified: Secondary | ICD-10-CM | POA: Diagnosis not present

## 2023-06-10 DIAGNOSIS — J45991 Cough variant asthma: Secondary | ICD-10-CM | POA: Diagnosis not present

## 2023-06-10 DIAGNOSIS — Z23 Encounter for immunization: Secondary | ICD-10-CM | POA: Diagnosis not present

## 2023-06-10 DIAGNOSIS — E119 Type 2 diabetes mellitus without complications: Secondary | ICD-10-CM | POA: Diagnosis not present

## 2023-06-10 DIAGNOSIS — E669 Obesity, unspecified: Secondary | ICD-10-CM | POA: Diagnosis not present

## 2023-06-10 DIAGNOSIS — I700 Atherosclerosis of aorta: Secondary | ICD-10-CM | POA: Diagnosis not present

## 2023-06-10 DIAGNOSIS — I10 Essential (primary) hypertension: Secondary | ICD-10-CM | POA: Diagnosis not present

## 2023-06-10 DIAGNOSIS — E559 Vitamin D deficiency, unspecified: Secondary | ICD-10-CM | POA: Diagnosis not present

## 2023-06-10 DIAGNOSIS — G43909 Migraine, unspecified, not intractable, without status migrainosus: Secondary | ICD-10-CM | POA: Diagnosis not present

## 2023-06-10 DIAGNOSIS — K635 Polyp of colon: Secondary | ICD-10-CM | POA: Diagnosis not present

## 2023-06-10 DIAGNOSIS — I2510 Atherosclerotic heart disease of native coronary artery without angina pectoris: Secondary | ICD-10-CM | POA: Diagnosis not present

## 2023-06-10 DIAGNOSIS — E039 Hypothyroidism, unspecified: Secondary | ICD-10-CM | POA: Diagnosis not present

## 2023-06-14 ENCOUNTER — Ambulatory Visit
Admission: RE | Admit: 2023-06-14 | Discharge: 2023-06-14 | Disposition: A | Discharge status: Home / Self Care | Payer: Medicare Other | Source: Ambulatory Visit | Attending: Rehabilitation | Admitting: Rehabilitation

## 2023-06-14 DIAGNOSIS — M48061 Spinal stenosis, lumbar region without neurogenic claudication: Secondary | ICD-10-CM | POA: Diagnosis not present

## 2023-06-14 DIAGNOSIS — M5106 Intervertebral disc disorders with myelopathy, lumbar region: Secondary | ICD-10-CM | POA: Diagnosis not present

## 2023-06-14 DIAGNOSIS — M5416 Radiculopathy, lumbar region: Secondary | ICD-10-CM

## 2023-06-14 DIAGNOSIS — M4316 Spondylolisthesis, lumbar region: Secondary | ICD-10-CM | POA: Diagnosis not present

## 2023-07-08 DIAGNOSIS — M5416 Radiculopathy, lumbar region: Secondary | ICD-10-CM | POA: Diagnosis not present

## 2023-07-26 DIAGNOSIS — E785 Hyperlipidemia, unspecified: Secondary | ICD-10-CM | POA: Diagnosis not present

## 2023-07-26 DIAGNOSIS — I10 Essential (primary) hypertension: Secondary | ICD-10-CM | POA: Diagnosis not present

## 2023-07-26 DIAGNOSIS — E119 Type 2 diabetes mellitus without complications: Secondary | ICD-10-CM | POA: Diagnosis not present

## 2023-07-26 DIAGNOSIS — G8929 Other chronic pain: Secondary | ICD-10-CM | POA: Diagnosis not present

## 2023-07-26 DIAGNOSIS — I2510 Atherosclerotic heart disease of native coronary artery without angina pectoris: Secondary | ICD-10-CM | POA: Diagnosis not present

## 2023-07-26 DIAGNOSIS — E039 Hypothyroidism, unspecified: Secondary | ICD-10-CM | POA: Diagnosis not present

## 2023-07-26 DIAGNOSIS — M6208 Separation of muscle (nontraumatic), other site: Secondary | ICD-10-CM | POA: Diagnosis not present

## 2023-07-26 DIAGNOSIS — M545 Low back pain, unspecified: Secondary | ICD-10-CM | POA: Diagnosis not present

## 2023-07-26 DIAGNOSIS — J45991 Cough variant asthma: Secondary | ICD-10-CM | POA: Diagnosis not present

## 2023-07-26 DIAGNOSIS — E669 Obesity, unspecified: Secondary | ICD-10-CM | POA: Diagnosis not present

## 2023-07-27 ENCOUNTER — Other Ambulatory Visit: Payer: Self-pay | Admitting: Pulmonary Disease

## 2023-07-28 DIAGNOSIS — M5416 Radiculopathy, lumbar region: Secondary | ICD-10-CM | POA: Diagnosis not present

## 2023-08-31 ENCOUNTER — Institutional Professional Consult (permissible substitution): Payer: Medicare Other | Admitting: Plastic Surgery

## 2023-09-06 DIAGNOSIS — Z6836 Body mass index (BMI) 36.0-36.9, adult: Secondary | ICD-10-CM | POA: Diagnosis not present

## 2023-09-06 DIAGNOSIS — N959 Unspecified menopausal and perimenopausal disorder: Secondary | ICD-10-CM | POA: Diagnosis not present

## 2023-09-06 DIAGNOSIS — Z01419 Encounter for gynecological examination (general) (routine) without abnormal findings: Secondary | ICD-10-CM | POA: Diagnosis not present

## 2023-09-06 DIAGNOSIS — Z1231 Encounter for screening mammogram for malignant neoplasm of breast: Secondary | ICD-10-CM | POA: Diagnosis not present

## 2023-09-09 DIAGNOSIS — L308 Other specified dermatitis: Secondary | ICD-10-CM | POA: Diagnosis not present

## 2024-01-18 ENCOUNTER — Ambulatory Visit: Payer: Self-pay | Admitting: Pulmonary Disease

## 2024-01-18 NOTE — Telephone Encounter (Signed)
 E2C2 Pulmonary Triage - Initial Assessment Questions "Chief Complaint (e.g., cough, sob, wheezing, fever, chills, sweat or additional symptoms) *Go to specific symptom protocol after initial questions. Patient with hx of PNA and asthma calling with concerns for cough, some shortness of breath and right sided lung pain today. Patient states the pain isn't significant-rates it 4 out of 10.  Cough is croupy and hard sounding with patient endorsing coughing up green sputum when she is able to. Patient states initially symptoms started as a cold but the cough has progressed. Patient is concerned for potential PNA. Denies fever, chest pain. Patient reports oxygen  saturation of 97% but states her shortness of breath is mild. Per protocol, patient is recommended to be seen today. Patient is wanting to see Hunsucker MD acutely. No appointments available within protocol. Patient is needing a phone call back for recommendations and scheduling. Patient was encouraged to call PCP to follow up. Patient verbalized understanding and all questions answered at this time. Patient will await call back from office.   "How long have symptoms been present?" Started 4 to five days ago  Have you tested for COVID or Flu? Note: If not, ask patient if a home test can be taken. If so, instruct patient to call back for positive results. No  MEDICINES:   "Have you used any OTC meds to help with symptoms?" Yes If yes, ask "What medications?" Mucinex   "Have you used your inhalers/maintenance medication?" Yes If yes, "What medications?" Albuterol  inhaler-not used regularly   If inhaler, ask "How many puffs and how often?" Note: Review instructions on medication in the chart.  OXYGEN : "Do you wear supplemental oxygen ?" No If yes, "How many liters are you supposed to use?"   "Do you monitor your oxygen  levels?" Yes If yes, "What is your reading (oxygen  level) today?" 97%  "What is your usual oxygen  saturation reading?"   (Note: Pulmonary O2 sats should be 90% or greater) Patient is unsure of what her usual reading is as she hasn't checked it since she had PNA   Copied from CRM [id number]. Topic: Clinical - Red Word Triage >> Jan 18, 2024 10:24 AM Hilton Lucky wrote: Red Word that prompted transfer to Nurse Triage: Patient thinks has pneumonia, feels like cough is 'sticking in throat' over windpipe and she cannot breathe. States she has a cold and generalized right side lung pain. Reason for Disposition  [1] MILD difficulty breathing (e.g., minimal/no SOB at rest, SOB with walking, pulse <100) AND [2] NEW-onset or WORSE than normal  Answer Assessment - Initial Assessment Questions 1. RESPIRATORY STATUS: "Describe your breathing?" (e.g., wheezing, shortness of breath, unable to speak, severe coughing)      Shortness of breath, coughing 2. ONSET: "When did this breathing problem begin?"      4-5 days ago 3. PATTERN "Does the difficult breathing come and go, or has it been constant since it started?"      constant 4. SEVERITY: "How bad is your breathing?" (e.g., mild, moderate, severe)    - MILD: No SOB at rest, mild SOB with walking, speaks normally in sentences, can lie down, no retractions, pulse < 100.    - MODERATE: SOB at rest, SOB with minimal exertion and prefers to sit, cannot lie down flat, speaks in phrases, mild retractions, audible wheezing, pulse 100-120.    - SEVERE: Very SOB at rest, speaks in single words, struggling to breathe, sitting hunched forward, retractions, pulse > 120      Mild 5. RECURRENT SYMPTOM: "  Have you had difficulty breathing before?" If Yes, ask: "When was the last time?" and "What happened that time?"      Yes-last time she was sick 6. CARDIAC HISTORY: "Do you have any history of heart disease?" (e.g., heart attack, angina, bypass surgery, angioplasty)       7. LUNG HISTORY: "Do you have any history of lung disease?"  (e.g., pulmonary embolus, asthma, emphysema)     Asthma, PNA 8.  CAUSE: "What do you think is causing the breathing problem?"      Possible PNA 9. OTHER SYMPTOMS: "Do you have any other symptoms? (e.g., dizziness, runny nose, cough, chest pain, fever)     cough  Protocols used: Breathing Difficulty-A-AH

## 2024-01-19 NOTE — Telephone Encounter (Signed)
 Pt scheduled for ov with Hunsucker for 01/20/24.

## 2024-01-20 ENCOUNTER — Encounter: Payer: Self-pay | Admitting: Pulmonary Disease

## 2024-01-20 ENCOUNTER — Ambulatory Visit (INDEPENDENT_AMBULATORY_CARE_PROVIDER_SITE_OTHER): Admitting: Pulmonary Disease

## 2024-01-20 VITALS — BP 119/74 | HR 64 | Ht 64.0 in | Wt 200.0 lb

## 2024-01-20 DIAGNOSIS — J9601 Acute respiratory failure with hypoxia: Secondary | ICD-10-CM | POA: Diagnosis not present

## 2024-01-20 DIAGNOSIS — J45991 Cough variant asthma: Secondary | ICD-10-CM

## 2024-01-20 DIAGNOSIS — R051 Acute cough: Secondary | ICD-10-CM

## 2024-01-20 MED ORDER — ALBUTEROL SULFATE HFA 108 (90 BASE) MCG/ACT IN AERS: 2.0000 | INHALATION_SPRAY | RESPIRATORY_TRACT | 3 refills | Status: AC | PRN

## 2024-01-20 MED ORDER — BREZTRI AEROSPHERE 160-9-4.8 MCG/ACT IN AERO: INHALATION_SPRAY | RESPIRATORY_TRACT | Status: AC

## 2024-01-20 NOTE — Progress Notes (Signed)
 @Patient  ID: Caitlin Stevenson, female    DOB: 09/16/1950, 73 y.o.   MRN: [id number]  Chief Complaint  Patient presents with   Acute Visit    Pt states  virus last Friday,sore throat, sputum greenish     Referring provider: [name], MD  HPI:   73 y.o. woman whom we are seeing for an acute visit for cough.  Overall doing well.  Contracted what sounds like a like viral illness a few days ago.  Cough congestion etc.  But symptoms largely improving.  Now with spastic cough.  Coughing fits.  Deep breath triggers and sometimes.  We discussed in the past feels of likely asthma given improvement with inhalers in the past.  Now doing better off inhalers.  Now with acute illness concern for bronchospasm.  Discussed treatment options including resuming inhalers, bronchodilators.  Somewhat improved with albuterol  which is encouraging.  HPI at initial visit: Returns for scheduled follow-up 1 month after last visit.  Last visit was hospital follow-up for severe bronchopneumonia as well as bilaterally.  She was on oxygen  at that time.  She has weaned herself off oxygen .  Noting her oxygen  stayed in the high 90s.  Oxygen  was returned.  Overall her cough is largely improved.  She is feeling better.  She still fatigued.  About every other day she spends a lot of time on the couch, naps.  The other day she seems to have more energy.  Feels very fatigued, lack of energy.  Denies significant shortness of breath.  We discussed strategy of gradual increase of exercises if it helps with her family as well as her fatigue.  She is able to do this at the gym, recommended treadmill, flat surface, moderate pace starting 10 to 15 minutes a day, increase as able in terms of speed as well as duration.  HPI at initial visit: Patient with normal state of health.  Grandson contracted upper respiratory illness early to mid 2023 January.  Whole family got similar crud.  Initially dry cough..  Lingered for a couple of weeks.   Most family was including original patient, grandchild, improved.  Barky, spasmodic cough continued.  Urgent care visit 09/26/2021.  Diagnosed with pneumonia based on chest x-ray.  Prescribed duo nebs as well as cefdinir and azithromycin .  No real improvement.  Had a course of prednisone  on hand, 40981 1 pill taper.  Mild improvement in terms of severity.  Was able to sleep last night so somewhat better at night.  No other relieving or exacerbating factors.  She has taken Mucinex  with minimal relief.  Dextromethorphan /promethazine  cough syrup without relief.  She reports history of asthma.  Only when on nonselective beta-blocker.  When that was discontinued her asthma symptoms resolved.  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, former NP, lives in [place] / Pulmonary Flowsheets:   ACT:  Asthma Control Test ACT Total Score  09/07/2022 10:11 AM 18  07/20/2022  9:50 AM 24  02/27/2022  3:57 PM 25    MMRC:     No data to display          Epworth:      No data to display          Tests:   FENO:  Lab Results  Component Value Date   NITRICOXIDE 21 12/31/2015    PFT:     No data to display  WALK:      No data to display          Imaging: Personally reviewed  Lab Results: Personally reviewed CBC    Component Value Date/Time   WBC 12.7 (H) 07/31/2022 0547   RBC 4.29 07/31/2022 0547   HGB 13.6 07/31/2022 0547   HCT 39.8 07/31/2022 0547   PLT 307 07/31/2022 0547   MCV 92.8 07/31/2022 0547   MCH 31.7 07/31/2022 0547   MCHC 34.2 07/31/2022 0547   RDW 12.7 07/31/2022 0547   LYMPHSABS 2.2 07/31/2022 0547   MONOABS 0.9 07/31/2022 0547   EOSABS 0.1 07/31/2022 0547   BASOSABS 0.0 07/31/2022 0547    BMET    Component Value Date/Time   NA 134 (L) 07/31/2022 0547   K 3.5 07/31/2022 0547    CL 96 (L) 07/31/2022 0547   CO2 26 07/31/2022 0547   GLUCOSE 143 (H) 07/31/2022 0547   BUN 29 (H) 07/31/2022 0547   CREATININE 0.80 07/31/2022 0547   CALCIUM 9.2 07/31/2022 0547   GFRNONAA >60 07/31/2022 0547   GFRAA >60 04/18/2019 0242    BNP    Component Value Date/Time   BNP 144.9 (H) 07/25/2022 1458    ProBNP No results found for: "PROBNP"  Specialty Problems       Pulmonary Problems   ASTHMA   Qualifier: Diagnosis of  By: Dene Fines CMA, Jacqualynn   Replacing diagnoses that were inactivated after the 11/30/22 regulatory import      Cough variant asthma vs UACS    NO 10/15/2015  = 51 - Spirometry 10/15/2015 wnl including [id number] with last saba > 8 h prior - 12/11/2015  After extensive coaching HFA effectiveness =    75% > challenge with dulera 100 2bid > stopped late April 2017 p improved off tenormin - added flutter valve 12/11/2015 with cyclical cough regimen - NO 12/31/2015   = 18 off dulera > ok to leave off  - Rec trial of gabapentin  12/31/2015 > f/u Dr Brent Cambric at [facility] prn       Acute hypoxemic respiratory failure (HCC)   Mucus plug in respiratory tract    Allergies  Allergen Reactions   Atenolol     Esophageal Spasms   Minocycline Hcl     Migraines and Dizziness   Morphine Sulfate Nausea And Vomiting   Amoxicillin Rash    Did it involve swelling of the face/tongue/throat, SOB, or low BP? No Did it involve sudden or severe rash/hives, skin peeling, or any reaction on the inside of your mouth or nose? No Did you need to seek medical attention at a hospital or doctor's office? No When did it last happen?    Childhood allergy   If all above answers are "NO", may proceed with cephalosporin use.    Penicillins Rash    Did it involve swelling of the face/tongue/throat, SOB, or low BP? No Did it involve sudden or severe rash/hives, skin peeling, or any reaction on the inside of your mouth or nose? No Did you need to seek medical attention at a hospital or doctor's  office? No When did it last happen?    Childhood allergy   If all above answers are "NO", may proceed with cephalosporin use.    Immunization History  Administered Date(s) Administered   Influenza Split 06/17/2015   Influenza,inj,Quad PF,6+ Mos 06/09/2018   Influenza-Unspecified 05/31/2018   PPD Test 09/24/2014, 02/04/2016   Pneumococcal Conjugate-13 04/17/2016   Td 09/01/2003   Tdap 04/17/2016   Zoster,  Live 10/16/2003    Past Medical History:  Diagnosis Date   Allergy    Arthritis    Chronic back pain    Depression    Diabetes mellitus without complication (HCC)    diet controlled   History of carpal tunnel syndrome    Bilateral   Hypertension    Hypothyroidism    Migraine    Obese    Pneumonia    x2   Psoriasis    Sleep apnea    mouth guard    Tobacco History: Social History   Tobacco Use  Smoking Status Never  Smokeless Tobacco Never   Counseling given: Not Answered   Continue to not smoke  Outpatient Encounter Medications as of 01/20/2024  Medication Sig   albuterol  (VENTOLIN  HFA) 108 (90 Base) MCG/ACT inhaler INHALE 2 PUFFS BY MOUTH EVERY 6 HOURS AS NEEDED FOR WHEEZING OR SHORTNESS OF BREATH   citalopram  (CELEXA ) 40 MG tablet Take 40 mg by mouth at bedtime.   estradiol  (VIVELLE -DOT) 0.1 MG/24HR patch Place 1 patch onto the skin 2 (two) times a week.   fluticasone  furoate-vilanterol (BREO ELLIPTA ) 200-25 MCG/ACT AEPB    levothyroxine  (SYNTHROID , LEVOTHROID) 137 MCG tablet Take 1 tablet (137 mcg total) by mouth daily before breakfast. Need annual appointment with labs for further refills (Patient taking differently: Take 137 mcg by mouth as directed. Take 1 tablet (137.5 mg) daily Mon-Fri. On Saturday Take 1/2 tablet (68.5 mg) & Hold on Sunday. Need annual appointment with labs for further refills)   olmesartan -hydrochlorothiazide  (BENICAR  HCT) 20-12.5 MG tablet Take 1 tablet by mouth daily.   ondansetron  (ZOFRAN ) 4 MG tablet Take 1 tablet (4 mg total) by  mouth every 6 (six) hours as needed for nausea.   progesterone  (PROMETRIUM ) 100 MG capsule Take 100 mg by mouth at bedtime as needed (sleep).   No facility-administered encounter medications on file as of 01/20/2024.     Review of Systems  Review of Systems  N/a Physical Exam  BP 119/74 (BP Location: Left Arm, Patient Position: Sitting, Cuff Size: Normal)   Pulse 64   Ht 5\' 4"  (1.626 m)   Wt 200 lb (90.7 kg)   SpO2 99%   BMI 34.33 kg/m   Wt Readings from Last 5 Encounters:  01/20/24 200 lb (90.7 kg)  02/10/23 212 lb 12.8 oz (96.5 kg)  11/04/22 214 lb 12.8 oz (97.4 kg)  09/07/22 210 lb 9.6 oz (95.5 kg)  08/03/22 202 lb 3.2 oz (91.7 kg)    BMI Readings from Last 5 Encounters:  01/20/24 34.33 kg/m  02/10/23 36.53 kg/m  11/04/22 36.87 kg/m  09/07/22 36.15 kg/m  08/03/22 34.71 kg/m     Physical Exam General: Well-appearing, sitting in chair Eyes: EOMI, icterus Neck: Supple, no JVP Pulmonary: Normal work of breathing, no expiratory wheeze Cardiovascular: Regular in rhythm, no murmur Abdomen: Nondistended, bowel sounds present MSK: No synovitis, no joint effusion Neuro: Normal gait, no weakness Psych: Normal mood, full affect   Assessment & Plan:   Acute cough: Sounds like bronchospasm.  Treatment recent viral illness.  In the past symptoms have improved with bronchodilators.  Mild improvement with as needed albuterol  currently.  Continue albuterol  as needed.  Samples of Breztri , can prescribe if ongoing cough persist.  Albuterol  refilled today.  History of asthma: Typical symptoms include cough.  Largely well-controlled off inhalers for some time now.  Return of cough as above with recent viral illness.  Has not responded well to DPI in the past.  No follow-ups on file.    Guerry Leek, MD 01/20/2024

## 2024-01-20 NOTE — Addendum Note (Signed)
 Addended by: Cristino Donna on: 01/20/2024 04:40 PM   Modules accepted: Orders

## 2024-01-20 NOTE — Patient Instructions (Signed)
 Nice to see you again  I think your air  tubes or bronchioles are inflamed and spastic causing spasming  Continue albuterol  as needed  Use Breztri  2 puffs in the morning, 2 puff in the evening, with samples.  Several the last 1 week.  Use until symptoms are improved  If symptoms persisting after 3 to 4 weeks let me know and we can reassess.

## 2024-01-21 ENCOUNTER — Telehealth: Payer: Self-pay | Admitting: *Deleted

## 2024-01-21 NOTE — Telephone Encounter (Signed)
 Copied from CRM [id number]. Topic: Clinical - Prescription Issue >> Jan 21, 2024 10:47 AM Caitlin Stevenson wrote: Reason for CRM: Patient states when saw Dr. Marygrace Snellen yesterday and was advised by him to call clinic if she thinks she needs prednisone , and patient is requesting the provider to send the prescription today as she really needs it.  Called the pt and there was no answer-LMTCB.

## 2024-01-25 NOTE — Telephone Encounter (Signed)
 Called and spoke with patient, she is coughing up green mucous for at least a week.  Denies any fever, chills or body aches.  1 week ago she had body aches.  She states her breathing is ok, she is having bronchiospasm, she is using the inhaler.   Patient is not acutely ill, advised that Dr. Marygrace Snellen is not available today, I can send the message to the provider of the day, or I can send a message directly to Dr. Marygrace Snellen that he will get tomorrow when he will be back in clinic.  She preferred that I send the message to Dr. Marygrace Snellen to address tomorrow.  I let her know that once we hear back from Dr. Marygrace Snellen we will call her back with his recommendations.  She verbalized understanding.  Dr. Marygrace Snellen, patient is requesting prednisone , she still has the cough, it is not as bad, however it has not resolved.  She preferred that I send the message to you as you discussed this during her office visit.  I did not see any mention of prednisone  in the note.  Please advise when you return to clinic.  Thank you.

## 2024-01-26 MED ORDER — PREDNISONE 20 MG PO TABS: 20.0000 mg | ORAL_TABLET | Freq: Every day | ORAL | 0 refills | Status: AC

## 2024-01-26 NOTE — Telephone Encounter (Signed)
I called and spoke with the pt and notified of response from Dr Judeth Horn. She verbalized understanding. Nothing further needed.

## 2024-01-26 NOTE — Telephone Encounter (Signed)
 Prednisone  20 mg daily x 5 days sent - thanks!

## 2024-02-01 DIAGNOSIS — N393 Stress incontinence (female) (male): Secondary | ICD-10-CM | POA: Diagnosis not present

## 2024-02-08 DIAGNOSIS — N3941 Urge incontinence: Secondary | ICD-10-CM | POA: Diagnosis not present

## 2024-02-08 DIAGNOSIS — N8111 Cystocele, midline: Secondary | ICD-10-CM | POA: Diagnosis not present

## 2024-02-21 DIAGNOSIS — L720 Epidermal cyst: Secondary | ICD-10-CM | POA: Diagnosis not present

## 2024-02-21 DIAGNOSIS — L821 Other seborrheic keratosis: Secondary | ICD-10-CM | POA: Diagnosis not present

## 2024-02-21 DIAGNOSIS — L820 Inflamed seborrheic keratosis: Secondary | ICD-10-CM | POA: Diagnosis not present

## 2024-04-06 DIAGNOSIS — E119 Type 2 diabetes mellitus without complications: Secondary | ICD-10-CM | POA: Diagnosis not present

## 2024-04-06 DIAGNOSIS — I10 Essential (primary) hypertension: Secondary | ICD-10-CM | POA: Diagnosis not present

## 2024-04-06 DIAGNOSIS — E785 Hyperlipidemia, unspecified: Secondary | ICD-10-CM | POA: Diagnosis not present

## 2024-04-06 DIAGNOSIS — E039 Hypothyroidism, unspecified: Secondary | ICD-10-CM | POA: Diagnosis not present

## 2024-05-30 ENCOUNTER — Other Ambulatory Visit: Payer: Self-pay | Admitting: Urology

## 2024-06-16 DIAGNOSIS — J432 Centrilobular emphysema: Secondary | ICD-10-CM | POA: Diagnosis not present

## 2024-06-16 DIAGNOSIS — E785 Hyperlipidemia, unspecified: Secondary | ICD-10-CM | POA: Diagnosis not present

## 2024-06-16 DIAGNOSIS — E039 Hypothyroidism, unspecified: Secondary | ICD-10-CM | POA: Diagnosis not present

## 2024-06-16 DIAGNOSIS — E559 Vitamin D deficiency, unspecified: Secondary | ICD-10-CM | POA: Diagnosis not present

## 2024-06-16 DIAGNOSIS — I2510 Atherosclerotic heart disease of native coronary artery without angina pectoris: Secondary | ICD-10-CM | POA: Diagnosis not present

## 2024-06-16 DIAGNOSIS — E119 Type 2 diabetes mellitus without complications: Secondary | ICD-10-CM | POA: Diagnosis not present

## 2024-06-16 DIAGNOSIS — G43909 Migraine, unspecified, not intractable, without status migrainosus: Secondary | ICD-10-CM | POA: Diagnosis not present

## 2024-06-16 DIAGNOSIS — E669 Obesity, unspecified: Secondary | ICD-10-CM | POA: Diagnosis not present

## 2024-06-16 DIAGNOSIS — I10 Essential (primary) hypertension: Secondary | ICD-10-CM | POA: Diagnosis not present

## 2024-06-16 DIAGNOSIS — J45991 Cough variant asthma: Secondary | ICD-10-CM | POA: Diagnosis not present

## 2024-06-22 NOTE — Patient Instructions (Signed)
 SURGICAL WAITING ROOM VISITATION  Patients having surgery or a procedure may have no more than 2 support people in the waiting area - these visitors may rotate.    Children under the age of 34 must have an adult with them who is not the patient.  Visitors with respiratory illnesses are discouraged from visiting and should remain at home.  If the patient needs to stay at the hospital during part of their recovery, the visitor guidelines for inpatient rooms apply. Pre-op nurse will coordinate an appropriate time for 1 support person to accompany patient in pre-op.  This support person may not rotate.    Please refer to the [facility] website for the visitor guidelines for Inpatients (after your surgery is over and you are in a regular room).       Your procedure is scheduled on: 06/29/24   Report to [facility] Main Entrance    Report to admitting at : 5:15 AM   Call this number if you have problems the morning of surgery [phone].   Clear liquids starting with the bowel prep until: 4:30 AM DAY OF SURGERY  Water  Non-Citrus Juices (without pulp, NO RED-Apple, White grape, White cranberry) Black Coffee (NO MILK/CREAM OR CREAMERS, sugar ok)  Clear Tea (NO MILK/CREAM OR CREAMERS, sugar ok) regular and decaf                             Plain Jell-O (NO RED)                                           Fruit ices (not with fruit pulp, NO RED)                                     Popsicles (NO RED)                                                               Sports drinks like Gatorade (NO RED)             FOLLOW BOWEL PREP AND ANY ADDITIONAL PRE OP INSTRUCTIONS YOU RECEIVED FROM YOUR SURGEON'S OFFICE!!!   Oral Hygiene is also important to reduce your risk of infection.                                    Remember - BRUSH YOUR TEETH THE MORNING OF SURGERY WITH YOUR REGULAR TOOTHPASTE  DENTURES WILL BE REMOVED PRIOR TO SURGERY PLEASE DO NOT APPLY Poly grip OR  ADHESIVES!!!   Do NOT smoke after Midnight   Stop all vitamins and herbal supplements 7 days before surgery.   Take these medicines the morning of surgery with A SIP OF WATER : levothyroxine .Sumatriptan  as needed.Use inhalers as usual and bring them.  Bring CPAP mask and tubing day of surgery.  You may not have any metal on your body including hair pins, jewelry, and body piercing             Do not wear make-up, lotions, powders, perfumes/cologne, or deodorant  Do not wear nail polish including gel and S&S, artificial/acrylic nails, or any other type of covering on natural nails including finger and toenails. If you have artificial nails, gel coating, etc. that needs to be removed by a nail salon please have this removed prior to surgery or surgery may need to be canceled/ delayed if the surgeon/ anesthesia feels like they are unable to be safely monitored.   Do not shave  48 hours prior to surgery.   Do not bring valuables to the hospital. [facility] IS NOT             RESPONSIBLE   FOR VALUABLES.   Contacts, glasses, dentures or bridgework may not be worn into surgery.   Bring small overnight bag day of surgery.   DO NOT BRING YOUR HOME MEDICATIONS TO THE HOSPITAL. PHARMACY WILL DISPENSE MEDICATIONS LISTED ON YOUR MEDICATION LIST TO YOU DURING YOUR ADMISSION IN THE HOSPITAL!    Patients discharged on the day of surgery will not be allowed to drive home.  Someone NEEDS to stay with you for the first 24 hours after anesthesia.   Special Instructions: Bring a copy of your healthcare power of attorney and living will documents the day of surgery if you haven't scanned them before.              Please read over the following fact sheets you were given: IF YOU HAVE QUESTIONS ABOUT YOUR PRE-OP INSTRUCTIONS PLEASE CALL [phone].   If you received a COVID test during your pre-op visit  it is requested that you wear a mask when out in public, stay away from  anyone that may not be feeling well and notify your surgeon if you develop symptoms. If you test positive for Covid or have been in contact with anyone that has tested positive in the last 10 days please notify you surgeon.    [facility] - Preparing for Surgery Before surgery, you can play an important role.  Because skin is not sterile, your skin needs to be as free of germs as possible.  You can reduce the number of germs on your skin by washing with CHG (chlorahexidine gluconate) soap before surgery.  CHG is an antiseptic cleaner which kills germs and bonds with the skin to continue killing germs even after washing. Please DO NOT use if you have an allergy to CHG or antibacterial soaps.  If your skin becomes reddened/irritated stop using the CHG and inform your nurse when you arrive at Short Stay. Do not shave (including legs and underarms) for at least 48 hours prior to the first CHG shower.  You may shave your face/neck.  Please follow these instructions carefully:  1.  Shower with CHG Soap the night before surgery ONLY (DO NOT USE THE SOAP THE MORNING OF SURGERY).  2.  If you choose to wash your hair, wash your hair first as usual with your normal  shampoo.  3.  After you shampoo, rinse your hair and body thoroughly to remove the shampoo.                             4.  Use CHG as you would any other liquid soap.  You can apply  chg directly to the skin and wash.  Gently with a scrungie or clean washcloth.  5.  Apply the CHG Soap to your body ONLY FROM THE NECK DOWN.   Do   not use on face/ open                           Wound or open sores. Avoid contact with eyes, ears mouth and   genitals (private parts).                       Wash face,  Genitals (private parts) with your normal soap.             6.  Wash thoroughly, paying special attention to the area where your    surgery  will be performed.  7.  Thoroughly rinse your body with warm water  from the neck down.  8.  DO NOT shower/wash with  your normal soap after using and rinsing off the CHG Soap.                9.  Pat yourself dry with a clean towel.            10.  Wear clean pajamas.            11.  Place clean sheets on your bed the night of your first shower and do not  sleep with pets. Day of Surgery : Do not apply any CHG, lotions/deodorants the morning of surgery.  Please wear clean clothes to the hospital/surgery center.  FAILURE TO FOLLOW THESE INSTRUCTIONS MAY RESULT IN THE CANCELLATION OF YOUR SURGERY  PATIENT SIGNATURE_________________________________  NURSE SIGNATURE__________________________________  ________________________________________________________________________ WHAT IS A BLOOD TRANSFUSION? Blood Transfusion Information  A transfusion is the replacement of blood or some of its parts. Blood is made up of multiple cells which provide different functions. Red blood cells carry oxygen  and are used for blood loss replacement. White blood cells fight against infection. Platelets control bleeding. Plasma helps clot blood. Other blood products are available for specialized needs, such as hemophilia or other clotting disorders. BEFORE THE TRANSFUSION  Who gives blood for transfusions?  Healthy volunteers who are fully evaluated to make sure their blood is safe. This is blood bank blood. Transfusion therapy is the safest it has ever been in the practice of medicine. Before blood is taken from a donor, a complete history is taken to make sure that person has no history of diseases nor engages in risky social behavior (examples are intravenous drug use or sexual activity with multiple partners). The donor's travel history is screened to minimize risk of transmitting infections, such as malaria. The donated blood is tested for signs of infectious diseases, such as HIV and hepatitis. The blood is then tested to be sure it is compatible with you in order to minimize the chance of a transfusion reaction. If you or a  relative donates blood, this is often done in anticipation of surgery and is not appropriate for emergency situations. It takes many days to process the donated blood. RISKS AND COMPLICATIONS Although transfusion therapy is very safe and saves many lives, the main dangers of transfusion include:  Getting an infectious disease. Developing a transfusion reaction. This is an allergic reaction to something in the blood you were given. Every precaution is taken to prevent this. The decision to have a blood transfusion has been considered carefully by your caregiver before blood is given.  Blood is not given unless the benefits outweigh the risks. AFTER THE TRANSFUSION Right after receiving a blood transfusion, you will usually feel much better and more energetic. This is especially true if your red blood cells have gotten low (anemic). The transfusion raises the level of the red blood cells which carry oxygen , and this usually causes an energy increase. The nurse administering the transfusion will monitor you carefully for complications. HOME CARE INSTRUCTIONS  No special instructions are needed after a transfusion. You may find your energy is better. Speak with your caregiver about any limitations on activity for underlying diseases you may have. SEEK MEDICAL CARE IF:  Your condition is not improving after your transfusion. You develop redness or irritation at the intravenous (IV) site. SEEK IMMEDIATE MEDICAL CARE IF:  Any of the following symptoms occur over the next 12 hours: Shaking chills. You have a temperature by mouth above 102 F (38.9 C), not controlled by medicine. Chest, back, or muscle pain. People around you feel you are not acting correctly or are confused. Shortness of breath or difficulty breathing. Dizziness and fainting. You get a rash or develop hives. You have a decrease in urine output. Your urine turns a dark color or changes to pink, red, or brown. Any of the following  symptoms occur over the next 10 days: You have a temperature by mouth above 102 F (38.9 C), not controlled by medicine. Shortness of breath. Weakness after normal activity. The white part of the eye turns yellow (jaundice). You have a decrease in the amount of urine or are urinating less often. Your urine turns a dark color or changes to pink, red, or brown. Document Released: 08/14/2000 Document Revised: 11/09/2011 Document Reviewed: 04/02/2008 [facility] Patient Information 2014 [place], [place].  _______________________________________________________________________

## 2024-06-23 ENCOUNTER — Encounter (HOSPITAL_COMMUNITY)
Admission: RE | Admit: 2024-06-23 | Discharge: 2024-06-23 | Disposition: A | Discharge status: Home / Self Care | Source: Ambulatory Visit | Attending: Urology | Admitting: Urology

## 2024-06-23 ENCOUNTER — Other Ambulatory Visit: Payer: Self-pay

## 2024-06-23 ENCOUNTER — Encounter (HOSPITAL_COMMUNITY): Payer: Self-pay

## 2024-06-23 VITALS — BP 120/76 | HR 61 | Temp 98.4°F | Ht 64.0 in | Wt 187.0 lb

## 2024-06-23 DIAGNOSIS — E119 Type 2 diabetes mellitus without complications: Secondary | ICD-10-CM | POA: Diagnosis not present

## 2024-06-23 DIAGNOSIS — I10 Essential (primary) hypertension: Secondary | ICD-10-CM | POA: Diagnosis not present

## 2024-06-23 DIAGNOSIS — Z01818 Encounter for other preprocedural examination: Secondary | ICD-10-CM | POA: Insufficient documentation

## 2024-06-23 HISTORY — DX: Other complications of anesthesia, initial encounter: T88.59XA

## 2024-06-23 HISTORY — DX: Nausea with vomiting, unspecified: R11.2

## 2024-06-23 LAB — BASIC METABOLIC PANEL WITH GFR
Anion gap: 8 (ref 5–15)
BUN: 30 mg/dL — ABNORMAL HIGH (ref 8–23)
CO2: 27 mmol/L (ref 22–32)
Calcium: 9.2 mg/dL (ref 8.9–10.3)
Chloride: 103 mmol/L (ref 98–111)
Creatinine, Ser: 0.75 mg/dL (ref 0.44–1.00)
GFR, Estimated: >60 mL/min (ref >60)
Glucose, Bld: 93 mg/dL (ref 70–99)
Potassium: 4.7 mmol/L (ref 3.5–5.1)
Sodium: 137 mmol/L (ref 135–145)

## 2024-06-23 LAB — CBC
HCT: 41.7 % (ref 36.0–46.0)
Hemoglobin: 13.3 g/dL (ref 12.0–15.0)
MCH: 30.4 pg (ref 26.0–34.0)
MCHC: 31.9 g/dL (ref 30.0–36.0)
MCV: 95.2 fL (ref 80.0–100.0)
Platelets: 227 K/uL (ref 150–400)
RBC: 4.38 MIL/uL (ref 3.87–5.11)
RDW: 13.1 % (ref 11.5–15.5)
WBC: 5.4 K/uL (ref 4.0–10.5)
nRBC: 0 % (ref 0.0–0.2)

## 2024-06-23 LAB — HEMOGLOBIN A1C
Hgb A1c MFr Bld: 5.1 % (ref 4.8–5.6)
Mean Plasma Glucose: 99.67 mg/dL

## 2024-06-23 LAB — GLUCOSE, CAPILLARY: Glucose-Capillary: 86 mg/dL (ref 70–99)

## 2024-06-23 NOTE — Progress Notes (Addendum)
 For Anesthesia: PCP - Nichole Senior, MD  Cardiologist - N/A  Bowel Prep reminder: Reviewed.  Chest x-ray -  EKG - 06/23/24 Stress Test -  ECHO - 11/04/22 Cardiac Cath -  Pacemaker/ICD device last checked: Pacemaker orders received: Device Rep notified:  Spinal Cord Stimulator:N/A  Sleep Study - Yes CPAP - NO  Fasting Blood Sugar - N/A Checks Blood Sugar __0___ times a day Date and result of last Hgb A1c-  Last dose of GLP1 agonist- N/A GLP1 instructions: Hold 7 days prior to schedule (Hold 24 hours-daily)   Last dose of SGLT-2 inhibitors- N/A SGLT-2 instructions: Hold 72 hours prior to surgery  Blood Thinner Instructions:N/A Last Dose: Time last taken:  Aspirin  Instructions:N/A Last Dose: Time last taken:  Activity level: Can go up a flight of stairs and activities of daily living without stopping and without chest pain and/or shortness of breath   Able to exercise without chest pain and/or shortness of breath  Anesthesia review: Hx: HTN,DIA,OSA(NO CPAP)  Patient denies shortness of breath, fever, cough and chest pain at PAT appointment   Patient verbalized understanding of instructions that were reviewed over the telephone.

## 2024-06-28 NOTE — Anesthesia Preprocedure Evaluation (Signed)
 Anesthesia Evaluation  Patient identified by MRN, date of birth, ID band Patient awake    Reviewed: Allergy & Precautions, H&P , NPO status , Patient's Chart, lab work & pertinent test results  History of Anesthesia Complications (+) PONV and history of anesthetic complications  Airway Mallampati: III  TM Distance: >3 FB Neck ROM: Full    Dental  (+) Teeth Intact, Dental Advisory Given   Pulmonary asthma (breztri , albuterol - took albuterol  this AM prophylactically , hasnt used breztri  in months) , sleep apnea (uses mouth guard)    Pulmonary exam normal breath sounds clear to auscultation       Cardiovascular hypertension (113/68 preop), Pt. on medications Normal cardiovascular exam Rhythm:Regular Rate:Normal  Echo 2024  1. Global longitudinal strain is -21.4% (normal). Left ventricular  ejection fraction, by estimation, is 60 to 65%. The left ventricle has  normal function. The left ventricle has no regional wall motion  abnormalities. Left ventricular diastolic  parameters were normal.   2. Right ventricular systolic function is normal. The right ventricular  size is normal.   3. The mitral valve is normal in structure. Trivial mitral valve  regurgitation.   4. The aortic valve is normal in structure. Aortic valve regurgitation is  not visualized.   5. The inferior vena cava is normal in size with greater than 50%  respiratory variability, suggesting right atrial pressure of 3 mmHg.      Neuro/Psych  Headaches PSYCHIATRIC DISORDERS  Depression       GI/Hepatic negative GI ROS, Neg liver ROS,,,  Endo/Other  diabetesHypothyroidism  BMI 32  Renal/GU negative Renal ROS  negative genitourinary   Musculoskeletal  (+) Arthritis , Osteoarthritis,    Abdominal  (+) + obese  Peds negative pediatric ROS (+)  Hematology negative hematology ROS (+) Hb 13.3   Anesthesia Other Findings Picture fell on face earlier this  week- bruising   Reproductive/Obstetrics negative OB ROS                              Anesthesia Physical Anesthesia Plan  ASA: 2  Anesthesia Plan: General   Post-op Pain Management: Tylenol  PO (pre-op)*   Induction: Intravenous  PONV Risk Score and Plan: 4 or greater and Ondansetron , Dexamethasone , Midazolam , Treatment may vary due to age or medical condition, Propofol  infusion and TIVA  Airway Management Planned: Oral ETT  Additional Equipment: None  Intra-op Plan:   Post-operative Plan: Extubation in OR  Informed Consent: I have reviewed the patients History and Physical, chart, labs and discussed the procedure including the risks, benefits and alternatives for the proposed anesthesia with the patient or authorized representative who has indicated his/her understanding and acceptance.     Dental advisory given  Plan Discussed with: CRNA  Anesthesia Plan Comments:          Anesthesia Quick Evaluation

## 2024-06-28 NOTE — H&P (Signed)
 Pelvic organ prolapse/ urinary incontinence   Patient presents today for further discussion and management of her pelvic organ prolapse. She had an A/P repair more than 20 years ago. At that time she also had a hysterectomy. She had been doing well, but over the course of the last 4 to 6 months she is noted worsening urinary tract symptoms and prolapse. She recently had an episode of flooding incontinence, this was not associated with urge, when she stood up on her way to the bathroom. She has been wearing a pessary now for 3 months, she finds it somewhat helpful, but does not want to keep a pessary in for the remainder of her life. She has some urinary frequency as well as urgency. She wears a pad now. She wears 1 pad per day. She is not taking any medication for urgency.   The patient states that she has a strong stream. She does not have to strain to void. She does not have a history of recurrent urinary tract infections. She does have some intermittent constipation. She is not sexually active.   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   The patient is interested in a combination surgery, would like to simultaneously have a panniculectomy.   UDS, 5/25: Normal bladder capacity, PVR initially 100 mL at the start of the procedure and up to 280 mL at the end of the procedure. There was no evidence of stress associated incontinence. There was no evidence of overactive bladder and she had excellent detrusor function. There was clear evidence of prolapse on fluoroscopy.   Interval: 2 the patient is here for follow-up. She has not been able to follow-up with plastic surgery regarding her panniculectomy, but has an appointment scheduled soon. She has not had any real significant progression of her voiding symptoms. She does have occasional urge associated flooding incontinence. She does not have much leakage with  coughing, laughing, and sneezing. She has not had any infections recently. Denies any hematuria.     ALLERGIES: Atenolol TABS - Esophageal spasms  Minocin CAPS - Headache Pencillins - Skin Rash    MEDICATIONS: Levothyroxine  Sodium 137 MCG Capsule  Benicar   Estradiol  0.1 MG/24HR Patch Weekly  traZODone HCl 100 MG Tablet     GU PSH: Complex cystometrogram, w/ void pressure and urethral pressure profile studies, any technique - 02/01/2024 Complex Uroflow - 02/01/2024 Emg surf Electrd - 02/01/2024 Inject For cystogram - 02/01/2024 Intrabd voidng Press - 02/01/2024       PSH Notes: Hysterectomy oophrectomy, A&P repair, tibia fracture   NON-GU PSH: Carpal tunnel surgery     GU PMH: Cystocele, midline - 02/01/2024, - 07/15/2023 Stress Incontinence - 02/01/2024, - 07/15/2023      PMH Notes:  1898-08-31 00:00:00 - Note: Normal Routine History And Physical Adult   NON-GU PMH: None   FAMILY HISTORY: Deceased - Mother, Father   SOCIAL HISTORY: Marital Status: Widowed Preferred Language: English; Ethnicity: Not Hispanic Or Latino; Race: White Has never drank.  Drinks 2 caffeinated drinks per day. 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    REVIEW OF SYSTEMS:    GU Review Female:   Patient denies leakage of urine, get up at night to urinate, trouble starting your stream, burning /pain with urination, being pregnant, stream starts and stops, frequent urination, have to strain to urinate, and hard to postpone urination.  Gastrointestinal (Upper):   Patient denies nausea, vomiting,  and indigestion/ heartburn.  Gastrointestinal (Lower):   Patient denies diarrhea and constipation.  Constitutional:   Patient denies fever, night sweats, weight loss, and fatigue.  Skin:   Patient denies skin rash/ lesion and itching.  Eyes:   Patient denies blurred vision and double vision.  Ears/ Nose/ Throat:   Patient denies sore throat and sinus problems.  Hematologic/Lymphatic:   Patient  denies swollen glands and easy bruising.  Cardiovascular:   Patient denies leg swelling and chest pains.  Respiratory:   Patient denies cough and shortness of breath.  Endocrine:   Patient denies excessive thirst.  Musculoskeletal:   Patient denies back pain and joint pain.  Neurological:   Patient denies headaches and dizziness.  Psychologic:   Patient denies depression and anxiety.   VITAL SIGNS: None   Complexity of Data:  Source Of History:  Patient  Records Review:   AUA Symptom Score, Previous Doctor Records, Previous Patient Records, POC Tool  Urine Test Review:   Urinalysis  Urodynamics Review:   Review Bladder Scan   PROCEDURES:          Visit Complexity - G2211          Urinalysis w/Scope Dipstick Dipstick Cont'd Micro  Color: Yellow Bilirubin: Neg mg/dL WBC/hpf: NS (Not Seen)  Appearance: Clear Ketones: Neg mg/dL RBC/hpf: 0 - 2/hpf  Specific Gravity: 1.025 Blood: Trace ery/uL Bacteria: Rare (0-9/hpf)  pH: 5.5 Protein: Neg mg/dL Cystals: NS (Not Seen)  Glucose: Neg mg/dL Urobilinogen: 0.2 mg/dL Casts: NS (Not Seen)    Nitrites: Neg Trichomonas: Not Present    Leukocyte Esterase: Neg leu/uL Mucous: Present      Epithelial Cells: 0 - 5/hpf      Yeast: NS (Not Seen)      Sperm: Not Present    ASSESSMENT:      ICD-10 Details  1 GU:   Cystocele, midline - N81.11   2   Urge incontinence - N39.41      PLAN:           Schedule Return Visit/Planned Activity: Next Available Appointment - Schedule Surgery          Document Letter(s):  Created for Patient: Clinical Summary         Notes:   The patient and I reviewed her urodynamics, which demonstrate really no evidence of stress associated incontinence. As a result, we discussed that as part of her surgery would not include a mid urethral sling. We discussed the sacrocolpopexy again in detail. I went through the surgery and the expected outcomes with her.   The patient is interested in having a concomitant  panniculectomy, and I will reach out to Dr. Elisabeth from plastic surgery to see if we can coordinate that.

## 2024-06-29 ENCOUNTER — Encounter (HOSPITAL_COMMUNITY)
Admission: RE | Disposition: A | Discharge status: Home / Self Care | Payer: Self-pay | Source: Ambulatory Visit | Attending: Urology

## 2024-06-29 ENCOUNTER — Encounter (HOSPITAL_COMMUNITY): Payer: Self-pay | Admitting: Urology

## 2024-06-29 ENCOUNTER — Other Ambulatory Visit: Payer: Self-pay

## 2024-06-29 ENCOUNTER — Ambulatory Visit (HOSPITAL_COMMUNITY): Payer: Self-pay | Admitting: Certified Registered"

## 2024-06-29 ENCOUNTER — Encounter (HOSPITAL_COMMUNITY): Payer: Self-pay | Admitting: Certified Registered"

## 2024-06-29 ENCOUNTER — Observation Stay (HOSPITAL_COMMUNITY)
Admission: RE | Admit: 2024-06-29 | Discharge: 2024-06-30 | Disposition: A | Discharge status: Home / Self Care | Source: Ambulatory Visit | Attending: Urology | Admitting: Urology

## 2024-06-29 DIAGNOSIS — N3941 Urge incontinence: Secondary | ICD-10-CM | POA: Insufficient documentation

## 2024-06-29 DIAGNOSIS — I10 Essential (primary) hypertension: Secondary | ICD-10-CM | POA: Insufficient documentation

## 2024-06-29 DIAGNOSIS — N8110 Cystocele, unspecified: Secondary | ICD-10-CM | POA: Diagnosis not present

## 2024-06-29 DIAGNOSIS — E039 Hypothyroidism, unspecified: Secondary | ICD-10-CM | POA: Insufficient documentation

## 2024-06-29 DIAGNOSIS — J45909 Unspecified asthma, uncomplicated: Secondary | ICD-10-CM | POA: Diagnosis not present

## 2024-06-29 DIAGNOSIS — E119 Type 2 diabetes mellitus without complications: Secondary | ICD-10-CM | POA: Insufficient documentation

## 2024-06-29 DIAGNOSIS — N814 Uterovaginal prolapse, unspecified: Secondary | ICD-10-CM | POA: Diagnosis not present

## 2024-06-29 DIAGNOSIS — N819 Female genital prolapse, unspecified: Principal | ICD-10-CM | POA: Diagnosis present

## 2024-06-29 DIAGNOSIS — N8111 Cystocele, midline: Principal | ICD-10-CM | POA: Insufficient documentation

## 2024-06-29 HISTORY — PX: ROBOTIC ASSISTED LAPAROSCOPIC SACROCOLPOPEXY: SHX5388

## 2024-06-29 LAB — CBC
HCT: 40.5 % (ref 36.0–46.0)
Hemoglobin: 12.9 g/dL (ref 12.0–15.0)
MCH: 30.7 pg (ref 26.0–34.0)
MCHC: 31.9 g/dL (ref 30.0–36.0)
MCV: 96.4 fL (ref 80.0–100.0)
Platelets: 232 K/uL (ref 150–400)
RBC: 4.2 MIL/uL (ref 3.87–5.11)
RDW: 12.8 % (ref 11.5–15.5)
WBC: 14 K/uL — ABNORMAL HIGH (ref 4.0–10.5)
nRBC: 0 % (ref 0.0–0.2)

## 2024-06-29 LAB — TYPE AND SCREEN
ABO/RH(D): O POS
Antibody Screen: NEGATIVE

## 2024-06-29 LAB — GLUCOSE, CAPILLARY
Glucose-Capillary: 95 mg/dL (ref 70–99)
Glucose-Capillary: 125 mg/dL — ABNORMAL HIGH (ref 70–99)

## 2024-06-29 LAB — ABO/RH: ABO/RH(D): O POS

## 2024-06-29 LAB — CREATININE, SERUM
Creatinine, Ser: 0.75 mg/dL (ref 0.44–1.00)
GFR, Estimated: >60 mL/min (ref >60)

## 2024-06-29 SURGERY — SACROCOLPOPEXY, ROBOT-ASSISTED, LAPAROSCOPIC
Anesthesia: General

## 2024-06-29 MED ORDER — POLYETHYLENE GLYCOL 3350 17 G PO PACK: 17.0000 g | PACK | Freq: Every day | ORAL | Status: DC | PRN

## 2024-06-29 MED ORDER — ACETAMINOPHEN 500 MG PO TABS
1000.0000 mg | ORAL_TABLET | Freq: Once | ORAL | Status: AC
  Administered 2024-06-29: 1000 mg via ORAL
  Filled 2024-06-29: qty 2

## 2024-06-29 MED ORDER — ROCURONIUM BROMIDE 10 MG/ML (PF) SYRINGE
PREFILLED_SYRINGE | INTRAVENOUS | Status: DC | PRN
  Administered 2024-06-29 (×3): 20 mg via INTRAVENOUS
  Administered 2024-06-29: 10 mg via INTRAVENOUS
  Administered 2024-06-29 (×2): 20 mg via INTRAVENOUS
  Administered 2024-06-29: 5 mg via INTRAVENOUS
  Administered 2024-06-29 (×2): 10 mg via INTRAVENOUS
  Administered 2024-06-29: 80 mg via INTRAVENOUS
  Administered 2024-06-29 (×2): 10 mg via INTRAVENOUS

## 2024-06-29 MED ORDER — BUPIVACAINE LIPOSOME 1.3 % IJ SUSP
INTRAMUSCULAR | Status: DC | PRN
  Administered 2024-06-29: 20 mL

## 2024-06-29 MED ORDER — PHENYLEPHRINE 80 MCG/ML (10ML) SYRINGE FOR IV PUSH (FOR BLOOD PRESSURE SUPPORT)
PREFILLED_SYRINGE | INTRAVENOUS | Status: DC | PRN
  Administered 2024-06-29: 80 ug via INTRAVENOUS

## 2024-06-29 MED ORDER — PROPOFOL 10 MG/ML IV BOLUS
INTRAVENOUS | Status: AC
  Filled 2024-06-29: qty 20

## 2024-06-29 MED ORDER — AMISULPRIDE (ANTIEMETIC) 5 MG/2ML IV SOLN: 10.0000 mg | Freq: Once | INTRAVENOUS | Status: DC | PRN

## 2024-06-29 MED ORDER — ONDANSETRON HCL 4 MG/2ML IJ SOLN
INTRAMUSCULAR | Status: AC
  Filled 2024-06-29: qty 2

## 2024-06-29 MED ORDER — CITALOPRAM HYDROBROMIDE 20 MG PO TABS
40.0000 mg | ORAL_TABLET | Freq: Every day | ORAL | Status: DC
  Administered 2024-06-29: 40 mg via ORAL
  Filled 2024-06-29: qty 2

## 2024-06-29 MED ORDER — DEXAMETHASONE SOD PHOSPHATE PF 10 MG/ML IJ SOLN
INTRAMUSCULAR | Status: DC | PRN
  Administered 2024-06-29: 4 mg via INTRAVENOUS

## 2024-06-29 MED ORDER — ESTRADIOL 0.01 % VA CREA
TOPICAL_CREAM | VAGINAL | Status: DC | PRN
  Administered 2024-06-29: 1 via VAGINAL

## 2024-06-29 MED ORDER — OXYBUTYNIN CHLORIDE 5 MG PO TABS
ORAL_TABLET | ORAL | Status: AC
  Filled 2024-06-29: qty 1

## 2024-06-29 MED ORDER — PROPOFOL 1000 MG/100ML IV EMUL
INTRAVENOUS | Status: AC
  Filled 2024-06-29: qty 300

## 2024-06-29 MED ORDER — CHLORHEXIDINE GLUCONATE 0.12 % MT SOLN
15.0000 mL | Freq: Once | OROMUCOSAL | Status: AC
  Administered 2024-06-29: 15 mL via OROMUCOSAL

## 2024-06-29 MED ORDER — OXYCODONE HCL 5 MG PO TABS
ORAL_TABLET | ORAL | Status: AC
  Filled 2024-06-29: qty 1

## 2024-06-29 MED ORDER — HYDROMORPHONE HCL 1 MG/ML IJ SOLN
0.2500 mg | INTRAMUSCULAR | Status: DC | PRN
  Administered 2024-06-29 (×4): 0.5 mg via INTRAVENOUS

## 2024-06-29 MED ORDER — BUPIVACAINE-EPINEPHRINE (PF) 0.25% -1:200000 IJ SOLN
INTRAMUSCULAR | Status: AC
  Filled 2024-06-29: qty 30

## 2024-06-29 MED ORDER — BUPIVACAINE-EPINEPHRINE (PF) 0.5% -1:200000 IJ SOLN
INTRAMUSCULAR | Status: DC | PRN
  Administered 2024-06-29: 30 mL

## 2024-06-29 MED ORDER — FENTANYL CITRATE (PF) 100 MCG/2ML IJ SOLN
INTRAMUSCULAR | Status: DC | PRN
  Administered 2024-06-29 (×4): 50 ug via INTRAVENOUS

## 2024-06-29 MED ORDER — LACTATED RINGERS IV SOLN: INTRAVENOUS | Status: DC

## 2024-06-29 MED ORDER — OXYBUTYNIN CHLORIDE 5 MG PO TABS
5.0000 mg | ORAL_TABLET | Freq: Once | ORAL | Status: AC
  Administered 2024-06-29: 5 mg via ORAL

## 2024-06-29 MED ORDER — ATROPINE SULFATE 0.4 MG/ML IV SOLN
INTRAVENOUS | Status: AC
  Filled 2024-06-29: qty 1

## 2024-06-29 MED ORDER — BUPIVACAINE LIPOSOME 1.3 % IJ SUSP
INTRAMUSCULAR | Status: AC
  Filled 2024-06-29: qty 20

## 2024-06-29 MED ORDER — ORAL CARE MOUTH RINSE: 15.0000 mL | Freq: Once | OROMUCOSAL | Status: AC

## 2024-06-29 MED ORDER — HYDROMORPHONE HCL 1 MG/ML IJ SOLN: 0.5000 mg | INTRAMUSCULAR | Status: DC | PRN

## 2024-06-29 MED ORDER — GLYCOPYRROLATE 0.2 MG/ML IJ SOLN
INTRAMUSCULAR | Status: AC
Start: 2024-06-29 — End: 2024-06-29
  Filled 2024-06-29: qty 1

## 2024-06-29 MED ORDER — PROPOFOL 1000 MG/100ML IV EMUL
INTRAVENOUS | Status: AC
  Filled 2024-06-29: qty 200

## 2024-06-29 MED ORDER — ROCURONIUM BROMIDE 10 MG/ML (PF) SYRINGE
PREFILLED_SYRINGE | INTRAVENOUS | Status: AC
  Filled 2024-06-29: qty 10

## 2024-06-29 MED ORDER — CIPROFLOXACIN IN D5W 400 MG/200ML IV SOLN
400.0000 mg | INTRAVENOUS | Status: AC
  Administered 2024-06-29: 400 mg via INTRAVENOUS
  Filled 2024-06-29: qty 200

## 2024-06-29 MED ORDER — BUPIVACAINE-EPINEPHRINE (PF) 0.5% -1:200000 IJ SOLN
INTRAMUSCULAR | Status: AC
  Filled 2024-06-29: qty 30

## 2024-06-29 MED ORDER — POLYETHYLENE GLYCOL 3350 17 GM/SCOOP PO POWD: 238.0000 g | Freq: Once | ORAL | Status: DC

## 2024-06-29 MED ORDER — ESTRADIOL 0.01 % VA CREA
TOPICAL_CREAM | VAGINAL | Status: AC
  Filled 2024-06-29: qty 42.5

## 2024-06-29 MED ORDER — TRAZODONE HCL 100 MG PO TABS
100.0000 mg | ORAL_TABLET | Freq: Every day | ORAL | Status: DC
  Administered 2024-06-29: 100 mg via ORAL
  Filled 2024-06-29: qty 1

## 2024-06-29 MED ORDER — KETAMINE HCL 50 MG/5ML IJ SOSY
PREFILLED_SYRINGE | INTRAMUSCULAR | Status: DC | PRN
  Administered 2024-06-29 (×2): 20 mg via INTRAVENOUS

## 2024-06-29 MED ORDER — HEPARIN SODIUM (PORCINE) 5000 UNIT/ML IJ SOLN
5000.0000 [IU] | Freq: Three times a day (TID) | INTRAMUSCULAR | Status: DC
  Administered 2024-06-29 – 2024-06-30 (×2): 5000 [IU] via SUBCUTANEOUS
  Filled 2024-06-29 (×2): qty 1

## 2024-06-29 MED ORDER — ACETAMINOPHEN 10 MG/ML IV SOLN
INTRAVENOUS | Status: AC
  Filled 2024-06-29: qty 100

## 2024-06-29 MED ORDER — KETAMINE HCL 50 MG/5ML IJ SOSY
PREFILLED_SYRINGE | INTRAMUSCULAR | Status: AC
  Filled 2024-06-29: qty 5

## 2024-06-29 MED ORDER — FENTANYL CITRATE (PF) 100 MCG/2ML IJ SOLN
INTRAMUSCULAR | Status: AC
  Filled 2024-06-29: qty 2

## 2024-06-29 MED ORDER — SUMATRIPTAN SUCCINATE 50 MG PO TABS: 100.0000 mg | ORAL_TABLET | ORAL | Status: DC | PRN

## 2024-06-29 MED ORDER — PROPOFOL 10 MG/ML IV BOLUS
INTRAVENOUS | Status: DC | PRN
  Administered 2024-06-29: 150 mg via INTRAVENOUS
  Administered 2024-06-29: 50 mg via INTRAVENOUS

## 2024-06-29 MED ORDER — PHENYLEPHRINE 80 MCG/ML (10ML) SYRINGE FOR IV PUSH (FOR BLOOD PRESSURE SUPPORT)
PREFILLED_SYRINGE | INTRAVENOUS | Status: AC
  Filled 2024-06-29: qty 10

## 2024-06-29 MED ORDER — LIDOCAINE HCL (PF) 2 % IJ SOLN
INTRAMUSCULAR | Status: AC
  Filled 2024-06-29: qty 5

## 2024-06-29 MED ORDER — LEVOTHYROXINE SODIUM 25 MCG PO TABS
137.0000 ug | ORAL_TABLET | Freq: Every day | ORAL | Status: DC
  Administered 2024-06-30: 137 ug via ORAL
  Filled 2024-06-29: qty 1

## 2024-06-29 MED ORDER — GLYCOPYRROLATE 0.2 MG/ML IJ SOLN
INTRAMUSCULAR | Status: DC | PRN
  Administered 2024-06-29: .2 mg via INTRAVENOUS

## 2024-06-29 MED ORDER — ONDANSETRON HCL 4 MG/2ML IJ SOLN
INTRAMUSCULAR | Status: DC | PRN
  Administered 2024-06-29: 4 mg via INTRAVENOUS

## 2024-06-29 MED ORDER — STERILE WATER FOR IRRIGATION IR SOLN
Status: DC | PRN
  Administered 2024-06-29: 1000 mL

## 2024-06-29 MED ORDER — LIDOCAINE HCL (CARDIAC) PF 100 MG/5ML IV SOSY
PREFILLED_SYRINGE | INTRAVENOUS | Status: DC | PRN
  Administered 2024-06-29: 60 mg via INTRAVENOUS

## 2024-06-29 MED ORDER — ACETAMINOPHEN 10 MG/ML IV SOLN
1000.0000 mg | Freq: Three times a day (TID) | INTRAVENOUS | Status: AC
  Administered 2024-06-29 – 2024-06-30 (×3): 1000 mg via INTRAVENOUS
  Filled 2024-06-29 (×2): qty 100

## 2024-06-29 MED ORDER — OXYCODONE HCL 5 MG/5ML PO SOLN: 5.0000 mg | Freq: Once | ORAL | Status: AC | PRN

## 2024-06-29 MED ORDER — OXYCODONE HCL 5 MG PO TABS
5.0000 mg | ORAL_TABLET | Freq: Once | ORAL | Status: AC | PRN
  Administered 2024-06-29: 5 mg via ORAL

## 2024-06-29 MED ORDER — KETOROLAC TROMETHAMINE 15 MG/ML IJ SOLN
INTRAMUSCULAR | Status: AC
  Filled 2024-06-29: qty 1

## 2024-06-29 MED ORDER — PROPOFOL 500 MG/50ML IV EMUL
INTRAVENOUS | Status: DC | PRN
  Administered 2024-06-29: 175 ug/kg/min via INTRAVENOUS

## 2024-06-29 MED ORDER — KETOROLAC TROMETHAMINE 15 MG/ML IJ SOLN
15.0000 mg | Freq: Four times a day (QID) | INTRAMUSCULAR | Status: DC
  Administered 2024-06-29 – 2024-06-30 (×4): 15 mg via INTRAVENOUS
  Filled 2024-06-29 (×3): qty 1

## 2024-06-29 MED ORDER — HYDROMORPHONE HCL 1 MG/ML IJ SOLN
INTRAMUSCULAR | Status: AC
  Filled 2024-06-29: qty 1

## 2024-06-29 MED ORDER — METHOCARBAMOL 1000 MG/10ML IJ SOLN
500.0000 mg | Freq: Once | INTRAMUSCULAR | Status: AC
  Administered 2024-06-29: 500 mg via INTRAVENOUS

## 2024-06-29 MED ORDER — METHOCARBAMOL 1000 MG/10ML IJ SOLN
INTRAMUSCULAR | Status: AC
  Filled 2024-06-29: qty 10

## 2024-06-29 MED ORDER — ONDANSETRON HCL 4 MG/2ML IJ SOLN: 4.0000 mg | Freq: Once | INTRAMUSCULAR | Status: DC | PRN

## 2024-06-29 MED ORDER — SUGAMMADEX SODIUM 200 MG/2ML IV SOLN
INTRAVENOUS | Status: DC | PRN
  Administered 2024-06-29: 200 mg via INTRAVENOUS

## 2024-06-29 MED ORDER — SODIUM CHLORIDE (PF) 0.9 % IJ SOLN
INTRAMUSCULAR | Status: AC
  Filled 2024-06-29: qty 20

## 2024-06-29 MED ORDER — OXYCODONE HCL 5 MG PO TABS
5.0000 mg | ORAL_TABLET | ORAL | Status: DC | PRN
  Administered 2024-06-30: 5 mg via ORAL
  Filled 2024-06-29: qty 1

## 2024-06-29 MED ORDER — PROGESTERONE MICRONIZED 100 MG PO CAPS
100.0000 mg | ORAL_CAPSULE | Freq: Every day | ORAL | Status: DC
  Administered 2024-06-29: 100 mg via ORAL
  Filled 2024-06-29: qty 1

## 2024-06-29 MED ORDER — CEFAZOLIN SODIUM-DEXTROSE 1-4 GM/50ML-% IV SOLN
1.0000 g | Freq: Three times a day (TID) | INTRAVENOUS | Status: AC
  Administered 2024-06-29 – 2024-06-30 (×2): 1 g via INTRAVENOUS
  Filled 2024-06-29 (×2): qty 50

## 2024-06-29 MED ORDER — SUGAMMADEX SODIUM 200 MG/2ML IV SOLN
INTRAVENOUS | Status: AC
  Filled 2024-06-29: qty 2

## 2024-06-29 MED ORDER — VANCOMYCIN HCL IN DEXTROSE 1-5 GM/200ML-% IV SOLN
1000.0000 mg | INTRAVENOUS | Status: AC
  Administered 2024-06-29: 1000 mg via INTRAVENOUS
  Filled 2024-06-29: qty 200

## 2024-06-29 SURGICAL SUPPLY — 67 items
BAG COUNTER SPONGE SURGICOUNT (BAG) IMPLANT
BAG URINE DRAIN 2000ML AR STRL (UROLOGICAL SUPPLIES) IMPLANT
BRIEF MESH DISP LRG (UNDERPADS AND DIAPERS) IMPLANT
CATH FOLEY 2WAY SLVR 5CC 16FR (CATHETERS) ×2 IMPLANT
CHLORAPREP W/TINT 26 (MISCELLANEOUS) ×2 IMPLANT
CLIP LIGATING HEM O LOK PURPLE (MISCELLANEOUS) ×2 IMPLANT
CLIP LIGATING HEMO LOK XL GOLD (MISCELLANEOUS) IMPLANT
CLIP LIGATING HEMO O LOK GREEN (MISCELLANEOUS) IMPLANT
CLIP SUT LAPRA TY ABSORB (SUTURE) IMPLANT
COVER BACK TABLE 60X90IN (DRAPES) ×2 IMPLANT
COVER SURGICAL LIGHT HANDLE (MISCELLANEOUS) ×2 IMPLANT
COVER TIP SHEARS 8 DVNC (MISCELLANEOUS) ×2 IMPLANT
DERMABOND ADVANCED .7 DNX12 (GAUZE/BANDAGES/DRESSINGS) ×2 IMPLANT
DRAPE ARM DVNC X/XI (DISPOSABLE) ×8 IMPLANT
DRAPE COLUMN DVNC XI (DISPOSABLE) ×2 IMPLANT
DRAPE INCISE IOBAN 66X45 STRL (DRAPES) ×2 IMPLANT
DRAPE SHEET LG 3/4 BI-LAMINATE (DRAPES) ×4 IMPLANT
DRAPE SURG IRRIG POUCH 19X23 (DRAPES) ×2 IMPLANT
DRIVER NDL LRG 8 DVNC XI (INSTRUMENTS) ×4 IMPLANT
DRIVER NDLE LRG 8 DVNC XI (INSTRUMENTS) ×2 IMPLANT
ELECT PENCIL ROCKER SW 15FT (MISCELLANEOUS) ×2 IMPLANT
ELECT REM PT RETURN 15FT ADLT (MISCELLANEOUS) ×2 IMPLANT
FORCEPS BPLR LNG DVNC XI (INSTRUMENTS) ×2 IMPLANT
FORCEPS PROGRASP DVNC XI (FORCEP) ×2 IMPLANT
FORCEPS TENACULUM DVNC XI (FORCEP) IMPLANT
GAUZE 4X4 16PLY ~~LOC~~+RFID DBL (SPONGE) IMPLANT
GLOVE BIO SURGEON STRL SZ 6.5 (GLOVE) ×2 IMPLANT
GLOVE SURG LX STRL 7.5 STRW (GLOVE) ×4 IMPLANT
GOWN STRL REUS W/ TWL XL LVL3 (GOWN DISPOSABLE) ×4 IMPLANT
GOWN STRL SURGICAL XL XLNG (GOWN DISPOSABLE) ×2 IMPLANT
HOLDER FOLEY CATH W/STRAP (MISCELLANEOUS) ×2 IMPLANT
IRRIGATION SUCT STRKRFLW 2 WTP (MISCELLANEOUS) ×2 IMPLANT
KIT BASIN OR (CUSTOM PROCEDURE TRAY) ×2 IMPLANT
KIT TURNOVER KIT A (KITS) ×2 IMPLANT
MANIPULATOR UTERINE 4.5 ZUMI (MISCELLANEOUS) IMPLANT
MARKER SKIN DUAL TIP RULER LAB (MISCELLANEOUS) ×2 IMPLANT
MESH Y UPSYLON VAGINAL (Mesh General) IMPLANT
OCCLUDER COLPOPNEUMO (BALLOONS) IMPLANT
PACKING VAGINAL (PACKING) IMPLANT
PAD OB MATERNITY 11 LF (PERSONAL CARE ITEMS) IMPLANT
PAD POSITIONING PINK XL (MISCELLANEOUS) ×2 IMPLANT
SCISSORS LAP 5X45 EPIX DISP (ENDOMECHANICALS) IMPLANT
SCISSORS MNPLR CVD DVNC XI (INSTRUMENTS) ×2 IMPLANT
SCRUB CHG 4% DYNA-HEX 4OZ (MISCELLANEOUS) IMPLANT
SEAL UNIV 5-12 XI (MISCELLANEOUS) ×8 IMPLANT
SET IRRIG Y TYPE TUR BLADDER L (SET/KITS/TRAYS/PACK) IMPLANT
SET TUBE SMOKE EVAC HIGH FLOW (TUBING) ×2 IMPLANT
SHEET LAVH (DRAPES) ×2 IMPLANT
SOL PREP POV-IOD 4OZ 10% (MISCELLANEOUS) IMPLANT
SOLUTION ELECTROSURG ANTI STCK (MISCELLANEOUS) ×2 IMPLANT
SPONGE T-LAP 4X18 ~~LOC~~+RFID (SPONGE) IMPLANT
SURGILUBE 2OZ TUBE FLIPTOP (MISCELLANEOUS) IMPLANT
SUT MNCRL AB 4-0 PS2 18 (SUTURE) ×4 IMPLANT
SUT MON AB 3-0 SH27 (SUTURE) IMPLANT
SUT PROLENE 2 0 CT 1 (SUTURE) ×2 IMPLANT
SUT STRATAFIX SPIRAL PDS3-0 (SUTURE) ×2 IMPLANT
SUT VIC AB 0 CT1 27XBRD ANTBC (SUTURE) ×6 IMPLANT
SUT VIC AB 2-0 SH 27XBRD (SUTURE) ×10 IMPLANT
SUT VIC AB 3-0 SH 27X BRD (SUTURE) ×2 IMPLANT
SUT VICRYL 0 UR6 27IN ABS (SUTURE) IMPLANT
SYR 50ML LL SCALE MARK (SYRINGE) IMPLANT
SYSTEM BAG RETRIEVAL 10MM (BASKET) IMPLANT
TOWEL OR 17X26 10 PK STRL BLUE (TOWEL DISPOSABLE) ×2 IMPLANT
TRAY LAPAROSCOPIC (CUSTOM PROCEDURE TRAY) ×2 IMPLANT
TROCAR Z THREAD OPTICAL 12X100 (TROCAR) ×2 IMPLANT
TROCAR Z-THREAD FIOS 5X100MM (TROCAR) ×2 IMPLANT
WATER STERILE IRR 1000ML POUR (IV SOLUTION) ×2 IMPLANT

## 2024-06-29 NOTE — Op Note (Addendum)
 Preoperative diagnosis:  Pelvic organ prolapse   Postoperative diagnosis:  same   Procedure: Robotic assisted laparoscopic sacrocolpopexy  Surgeon: Morene MICAEL Salines, MD 1st assistant: Maurilio Agar, MD  Anesthesia: General  Complications: None  Intraoperative findings:  #1 [employer] scientific Upsilon Y-Mesh placed, cut to specific vaginal length #2 Rectum adherent to vaginal wall, bladder stuck to vagina at area of previous repair.  EBL: 150cc  Specimens: None  Indication: Caitlin Stevenson is a 73 y.o. female patient with symptomatic pelvic organ prolapse.  After reviewing the management options for treatment, he elected to proceed with the above surgical procedure(s). We have discussed the potential benefits and risks of the procedure, side effects of the proposed treatment, the likelihood of the patient achieving the goals of the procedure, and any potential problems that might occur during the procedure or recuperation. Informed consent has been obtained.  Description of procedure:  A Foley catheter was then placed and placed to gravity drainage. I then made a periumbilical incision carrying the dissection down to the patient's fascia with electrocautery.  Once to the fascia, the fascia was incised the peritoneum opened.  A 8mm port was then placed into the abdomen.  The abdomen was insufflated and the remaining ports placed under digital guidance.  2 ports were placed lateral to the umbilicus on the right proximally 10 cm apart.  The most lateral port was approximately 3 cm above the anterior iliac spine.  2 additional ports were placed in the patient's right side in comparable positions to the most lateral port on the right was a 12 mm port.the robot was then docked at an angle from the leg obliquely along the side of the left leg.  We then began our surgery by cleaning up some of the pelvic adhesions to the small bowel and colon.  Once this was completed I started dissecting at the  sacral promontory located 3 cm medial to the location where the ureter crosses over the iliac vessels at the pelvic brim. The posterior peritoneum was incised and the sacral prominence cleared off an area taking care to avoid the middle sacral vessels and the iliac branches.  I then created a posterior peritoneal tunnel starting at the sacral promontory and tunneling down the right pelvic sidewall down into the pelvis breaking back through the posterior peritoneum around the vesico-vaginal junction posteriorly.  I then continued the posterior dissection retracting down on the rectum and finding the avascular plane between the posterior vaginal wall and the rectum.  I carried this dissection down as far as I could to along the area of the perineal body.  I then turned my attention to the anterior plane between the anterior vaginal wall and the bladder.  I was able to obtain access to the avascular plane and with a combination of both monopolar cautery and blunt dissection was able to clean and nice down to the bladder neck.  I then turned my attention back to the patient's uterus and skeletonized the right uterine artery and vein and then took this with a series of bipolar moves.  I then performed a similar uterus pedicle ligation on the left.    Mesh was measured at approximately 9cm anteriorly and 11 cm posteriorly and I cut this on the back table.  The mesh was then placed into the patient's abdomen through the assistant port and the anterior leaf was secured down onto the anterior vaginal wall with the apex at the bladder neck.  The posterior leaf was  then secured down on the posterior vaginal wall.  These were sewn down with 2-0 Vicryl.  Between 6 and 8 were done on each side.  At this point I then went back to the previously dissected sacral promontory and posterior peritoneal tunnel and inserted a instrument through the tunnel and grasped the end of the mesh at the vaginal cuff and pull it up to the sacrum.   I then checked to ensure that the sacral mesh was not too tight by performing a vaginal exam.  I then secured the sacral leg of the mesh using a 0 Prolene.  I then reapproximated the posterior peritoneum with a 2-0 Vicryl in a running fashion around the sacral promontory.  The pelvic peritoneum was closed using a pursestring.   The fascia of the 12 mm port was then closed with 0 Vicryl in a figure-of-eight fashion.  The skin was closed with 4-0 Monocryl's.  Dermabond was applied to the incision and exparel  injected into the incisions.  Estrace  impregnated packing was then placed into her vagina which will be left in overnight.  The patient was subsequently extubated and returned to the PACU in excellent condition.   Morene MICAEL Salines, M.D.

## 2024-06-29 NOTE — Anesthesia Postprocedure Evaluation (Signed)
 Anesthesia Post Note  Patient: YEVONNE YOKUM  Procedure(s) Performed: SACROCOLPOPEXY, ROBOT-ASSISTED, LAPAROSCOPIC     Patient location during evaluation: PACU Anesthesia Type: General Level of consciousness: awake and alert, oriented and patient cooperative Pain management: pain level controlled Vital Signs Assessment: post-procedure vital signs reviewed and stable Respiratory status: spontaneous breathing, nonlabored ventilation and respiratory function stable Cardiovascular status: blood pressure returned to baseline and stable Postop Assessment: no apparent nausea or vomiting Anesthetic complications: no   No notable events documented.  Last Vitals:  Vitals:   06/29/24 1400 06/29/24 1415  BP: 118/71 (!) 110/56  Pulse: 62 63  Resp: 14 16  Temp:    SpO2: 100% 99%    Last Pain:  Vitals:   06/29/24 1439  TempSrc:   PainSc: 5                  Almarie CHRISTELLA Marchi

## 2024-06-29 NOTE — Interval H&P Note (Signed)
 History and Physical Interval Note:  06/29/2024 7:18 AM  Caitlin Stevenson  has presented today for surgery, with the diagnosis of PELVIC ORGAN PROLAPSE.  The various methods of treatment have been discussed with the patient and family. After consideration of risks, benefits and other options for treatment, the patient has consented to  Procedure(s) with comments: SACROCOLPOPEXY, ROBOT-ASSISTED, LAPAROSCOPIC (N/A) - ROBOTIC SACROCOLPOPEXY as a surgical intervention.  The patient's history has been reviewed, patient examined, no change in status, stable for surgery.  I have reviewed the patient's chart and labs.  Questions were answered to the patient's satisfaction.     [name]

## 2024-06-29 NOTE — Anesthesia Procedure Notes (Signed)
 Procedure Name: Intubation Date/Time: 06/29/2024 7:40 AM  Performed by: Merla Almarie HERO, DOPre-anesthesia Checklist: Patient identified, Emergency Drugs available, Suction available and Patient being monitored Patient Re-evaluated:Patient Re-evaluated prior to induction Oxygen  Delivery Method: Circle system utilized Preoxygenation: Pre-oxygenation with 100% oxygen  Induction Type: IV induction Ventilation: Mask ventilation without difficulty Laryngoscope Size: Mac and 3 Grade View: Grade II Tube type: Oral Tube size: 7.5 mm Number of attempts: 1 Airway Equipment and Method: Stylet and Oral airway Placement Confirmation: ETT inserted through vocal cords under direct vision, positive ETCO2 and breath sounds checked- equal and bilateral Secured at: 21 cm Tube secured with: Tape Dental Injury: Teeth and Oropharynx as per pre-operative assessment  Comments: Intubation by medical student under direct supervision, grade 2a view, no issues.

## 2024-06-29 NOTE — Transfer of Care (Signed)
 Immediate Anesthesia Transfer of Care Note  Patient: Caitlin Stevenson  Procedure(s) Performed: SACROCOLPOPEXY, ROBOT-ASSISTED, LAPAROSCOPIC  Patient Location: PACU  Anesthesia Type:General  Level of Consciousness: awake, alert , and patient cooperative  Airway & Oxygen  Therapy: Patient Spontanous Breathing and Patient connected to face mask oxygen   Post-op Assessment: Report given to RN and Post -op Vital signs reviewed and stable  Post vital signs: Reviewed and stable  Last Vitals:  Vitals Value Taken Time  BP 136/82 06/29/24 12:47  Temp 35.7 C 06/29/24 12:47  Pulse 75 06/29/24 12:51  Resp 14 06/29/24 12:52  SpO2 99 % 06/29/24 12:51  Vitals shown include unfiled device data.  Last Pain:  Vitals:   06/29/24 1247  TempSrc: Axillary  PainSc:          Complications: No notable events documented.

## 2024-06-30 ENCOUNTER — Encounter (HOSPITAL_COMMUNITY): Payer: Self-pay | Admitting: Urology

## 2024-06-30 DIAGNOSIS — J45909 Unspecified asthma, uncomplicated: Secondary | ICD-10-CM | POA: Diagnosis not present

## 2024-06-30 DIAGNOSIS — E119 Type 2 diabetes mellitus without complications: Secondary | ICD-10-CM | POA: Diagnosis not present

## 2024-06-30 DIAGNOSIS — E039 Hypothyroidism, unspecified: Secondary | ICD-10-CM | POA: Diagnosis not present

## 2024-06-30 DIAGNOSIS — I10 Essential (primary) hypertension: Secondary | ICD-10-CM | POA: Diagnosis not present

## 2024-06-30 DIAGNOSIS — N3941 Urge incontinence: Secondary | ICD-10-CM | POA: Diagnosis not present

## 2024-06-30 DIAGNOSIS — N8111 Cystocele, midline: Secondary | ICD-10-CM | POA: Diagnosis not present

## 2024-06-30 LAB — BASIC METABOLIC PANEL WITH GFR
Anion gap: 7 (ref 5–15)
BUN: 15 mg/dL (ref 8–23)
CO2: 27 mmol/L (ref 22–32)
Calcium: 8.6 mg/dL — ABNORMAL LOW (ref 8.9–10.3)
Chloride: 101 mmol/L (ref 98–111)
Creatinine, Ser: 0.74 mg/dL (ref 0.44–1.00)
GFR, Estimated: >60 mL/min (ref >60)
Glucose, Bld: 99 mg/dL (ref 70–99)
Potassium: 4.3 mmol/L (ref 3.5–5.1)
Sodium: 134 mmol/L — ABNORMAL LOW (ref 135–145)

## 2024-06-30 LAB — CBC
HCT: 33.7 % — ABNORMAL LOW (ref 36.0–46.0)
Hemoglobin: 10.9 g/dL — ABNORMAL LOW (ref 12.0–15.0)
MCH: 30.4 pg (ref 26.0–34.0)
MCHC: 32.3 g/dL (ref 30.0–36.0)
MCV: 93.9 fL (ref 80.0–100.0)
Platelets: 200 K/uL (ref 150–400)
RBC: 3.59 MIL/uL — ABNORMAL LOW (ref 3.87–5.11)
RDW: 12.8 % (ref 11.5–15.5)
WBC: 8.1 K/uL (ref 4.0–10.5)
nRBC: 0 % (ref 0.0–0.2)

## 2024-06-30 MED ORDER — CELECOXIB 200 MG PO CAPS: 200.0000 mg | ORAL_CAPSULE | Freq: Every day | ORAL | 0 refills | Status: AC

## 2024-06-30 MED ORDER — ACETAMINOPHEN 500 MG PO TABS: 1000.0000 mg | ORAL_TABLET | Freq: Three times a day (TID) | ORAL | 0 refills | Status: AC

## 2024-06-30 MED ORDER — TRAMADOL HCL 50 MG PO TABS: 50.0000 mg | ORAL_TABLET | Freq: Four times a day (QID) | ORAL | 0 refills | Status: AC | PRN

## 2024-06-30 MED ORDER — KETOROLAC TROMETHAMINE 15 MG/ML IJ SOLN
15.0000 mg | Freq: Once | INTRAMUSCULAR | Status: AC
  Administered 2024-06-30: 15 mg via INTRAVENOUS
  Filled 2024-06-30: qty 1

## 2024-06-30 NOTE — Discharge Summary (Signed)
 Date of admission: 06/29/2024  Date of discharge: 06/30/2024  Admission diagnosis: pelvic organ prolapse  Discharge diagnosis: same  Secondary diagnoses:  Patient Active Problem List   Diagnosis Date Noted   Prolapse of female pelvic organs 06/29/2024   Normocytic anemia 08/01/2022   Leukocytosis 08/01/2022   Hyponatremia 08/01/2022   Acute hypoxemic respiratory failure (HCC) 07/25/2022   Mucus plug in respiratory tract 07/25/2022   S/P total knee replacement 04/17/2019   Hypothyroidism 04/17/2016   Morbid obesity due to excess calories (HCC) 12/31/2015   Ventral hernia 10/20/2015   Essential hypertension 10/16/2015   Cough variant asthma vs UACS  10/15/2015   Diabetes mellitus type 2 in obese 05/11/2007   Hyperlipidemia 05/11/2007   DEPRESSION 05/11/2007   ASTHMA 05/11/2007   Migraine 05/11/2007   URINARY INCONTINENCE 05/11/2007    Procedures performed: Procedure(s): SACROCOLPOPEXY, ROBOT-ASSISTED, LAPAROSCOPIC  History and Physical: For full details, please see admission history and physical. Briefly, Caitlin Stevenson is a 73 y.o. year old patient with pelvic organ prolapse scheduled to undergo sacral colpopexy on 06/29/24.   Hospital Course: Patient tolerated the procedure well.  She was then transferred to the floor after an uneventful PACU stay.  Her hospital course was uncomplicated.  On POD#1 she had met discharge criteria: was eating a regular diet, was up and ambulating independently,  pain was well controlled, was voiding without a catheter, vaginal packing had been removed, and was ready to for discharge.  PE:  General: well-appearing HEENT: PERRLA Resp: NWOB on RA CV: RRR Abdomen: Incisions c/d/I with expected surrounding bruising GU: Foley and vaginal packing removed   Laboratory values:  Recent Labs    06/29/24 1502 06/30/24 0525  WBC 14.0* 8.1  HGB 12.9 10.9*  HCT 40.5 33.7*   Recent Labs    06/29/24 1349 06/30/24 0525  NA  --  134*  K  --   4.3  CL  --  101  CO2  --  27  GLUCOSE  --  99  BUN  --  15  CREATININE 0.75 0.74  CALCIUM  --  8.6*   No results for input(s): LABPT, INR in the last 72 hours. No results for input(s): LABURIN in the last 72 hours. Results for orders placed or performed during the hospital encounter of 07/25/22  Resp Panel by RT-PCR (Flu A&B, Covid) Anterior Nasal Swab     Status: None   Collection Time: 07/25/22  2:58 PM   Specimen: Anterior Nasal Swab  Result Value Ref Range Status   SARS Coronavirus 2 by RT PCR NEGATIVE NEGATIVE Final    Comment: (NOTE) SARS-CoV-2 target nucleic acids are NOT DETECTED.  The SARS-CoV-2 RNA is generally detectable in upper respiratory specimens during the acute phase of infection. The lowest concentration of SARS-CoV-2 viral copies this assay can detect is 138 copies/mL. A negative result does not preclude SARS-Cov-2 infection and should not be used as the sole basis for treatment or other patient management decisions. A negative result may occur with  improper specimen collection/handling, submission of specimen other than nasopharyngeal swab, presence of viral mutation(s) within the areas targeted by this assay, and inadequate number of viral copies(<138 copies/mL). A negative result must be combined with clinical observations, patient history, and epidemiological information. The expected result is Negative.  Fact Sheet for Patients:  bloggercourse.com  Fact Sheet for Healthcare Providers:  seriousbroker.it  This test is no t yet approved or cleared by the [place]  FDA and  has been authorized for  detection and/or diagnosis of SARS-CoV-2 by FDA under an Emergency Use Authorization (EUA). This EUA will remain  in effect (meaning this test can be used) for the duration of the COVID-19 declaration under Section 564(b)(1) of the Act, 21 U.S.C.section 360bbb-3(b)(1), unless the authorization is  terminated  or revoked sooner.       Influenza A by PCR NEGATIVE NEGATIVE Final   Influenza B by PCR NEGATIVE NEGATIVE Final    Comment: (NOTE) The Xpert Xpress SARS-CoV-2/FLU/RSV plus assay is intended as an aid in the diagnosis of influenza from Nasopharyngeal swab specimens and should not be used as a sole basis for treatment. Nasal washings and aspirates are unacceptable for Xpert Xpress SARS-CoV-2/FLU/RSV testing.  Fact Sheet for Patients: bloggercourse.com  Fact Sheet for Healthcare Providers: seriousbroker.it  This test is not yet approved or cleared by the [place]  FDA and has been authorized for detection and/or diagnosis of SARS-CoV-2 by FDA under an Emergency Use Authorization (EUA). This EUA will remain in effect (meaning this test can be used) for the duration of the COVID-19 declaration under Section 564(b)(1) of the Act, 21 U.S.C. section 360bbb-3(b)(1), unless the authorization is terminated or revoked.  Performed at [facility], [address] [place], [place] [address]   MRSA Next Gen by PCR, Nasal     Status: None   Collection Time: 07/25/22  8:52 PM   Specimen: Nasal Mucosa; Nasal Swab  Result Value Ref Range Status   MRSA by PCR Next Gen NOT DETECTED NOT DETECTED Final    Comment: (NOTE) The GeneXpert MRSA Assay (FDA approved for NASAL specimens only), is one component of a comprehensive MRSA colonization surveillance program. It is not intended to diagnose MRSA infection nor to guide or monitor treatment for MRSA infections. Test performance is not FDA approved in patients less than 7 years old. Performed at [facility], [address] [place], [place] [address]   Respiratory (~20 pathogens) panel by PCR     Status: None   Collection Time: 07/25/22 10:10 PM   Specimen: Nasopharyngeal Swab; Respiratory  Result Value Ref Range Status   Adenovirus NOT DETECTED  NOT DETECTED Final   Coronavirus 229E NOT DETECTED NOT DETECTED Final    Comment: (NOTE) The Coronavirus on the Respiratory Panel, DOES NOT test for the novel  Coronavirus (2019 nCoV)    Coronavirus HKU1 NOT DETECTED NOT DETECTED Final   Coronavirus NL63 NOT DETECTED NOT DETECTED Final   Coronavirus OC43 NOT DETECTED NOT DETECTED Final   Metapneumovirus NOT DETECTED NOT DETECTED Final   Rhinovirus / Enterovirus NOT DETECTED NOT DETECTED Final   Influenza A NOT DETECTED NOT DETECTED Final   Influenza B NOT DETECTED NOT DETECTED Final   Parainfluenza Virus 1 NOT DETECTED NOT DETECTED Final   Parainfluenza Virus 2 NOT DETECTED NOT DETECTED Final   Parainfluenza Virus 3 NOT DETECTED NOT DETECTED Final   Parainfluenza Virus 4 NOT DETECTED NOT DETECTED Final   Respiratory Syncytial Virus NOT DETECTED NOT DETECTED Final   Bordetella pertussis NOT DETECTED NOT DETECTED Final   Bordetella Parapertussis NOT DETECTED NOT DETECTED Final   Chlamydophila pneumoniae NOT DETECTED NOT DETECTED Final   Mycoplasma pneumoniae NOT DETECTED NOT DETECTED Final    Comment: Performed at [facility], [address] [place], [place] [address]  Expectorated Sputum Assessment w Gram Stain, Rflx to Resp Cult     Status: None   Collection Time: 07/27/22  2:28 PM   Specimen: Sputum  Result Value Ref Range Status   Specimen Description SPUTUM  Final   Special Requests NONE  Final   Sputum evaluation   Final    THIS SPECIMEN IS ACCEPTABLE FOR SPUTUM CULTURE Performed at [facility], [address], [place], [place] [address]    Report Status 07/27/2022 FINAL  Final  Culture, Respiratory w Gram Stain     Status: None   Collection Time: 07/27/22  2:28 PM   Specimen: SPU  Result Value Ref Range Status   Specimen Description   Final    SPUTUM Performed at [facility], [address], [place], [place] [address]    Special Requests   Final    NONE Reflexed from  [id number] Performed at [facility], [address], [place], [place] [address]    Gram Stain   Final    RARE SQUAMOUS EPITHELIAL CELLS PRESENT RARE WBC PRESENT,BOTH PMN AND MONONUCLEAR MODERATE GRAM POSITIVE COCCI IN CHAINS MODERATE GRAM POSITIVE COCCI IN CLUSTERS    Culture   Final    FEW Normal respiratory flora-no Staph aureus or Pseudomonas seen Performed at [facility], [address] [place], [place] [address]    Report Status 07/30/2022 FINAL  Final  Respiratory (~20 pathogens) panel by PCR     Status: None   Collection Time: 07/28/22  9:59 AM   Specimen: Nasopharyngeal Swab; Respiratory  Result Value Ref Range Status   Adenovirus NOT DETECTED NOT DETECTED Final   Coronavirus 229E NOT DETECTED NOT DETECTED Final    Comment: (NOTE) The Coronavirus on the Respiratory Panel, DOES NOT test for the novel  Coronavirus (2019 nCoV)    Coronavirus HKU1 NOT DETECTED NOT DETECTED Final   Coronavirus NL63 NOT DETECTED NOT DETECTED Final   Coronavirus OC43 NOT DETECTED NOT DETECTED Final   Metapneumovirus NOT DETECTED NOT DETECTED Final   Rhinovirus / Enterovirus NOT DETECTED NOT DETECTED Final   Influenza A NOT DETECTED NOT DETECTED Final   Influenza B NOT DETECTED NOT DETECTED Final   Parainfluenza Virus 1 NOT DETECTED NOT DETECTED Final   Parainfluenza Virus 2 NOT DETECTED NOT DETECTED Final   Parainfluenza Virus 3 NOT DETECTED NOT DETECTED Final   Parainfluenza Virus 4 NOT DETECTED NOT DETECTED Final   Respiratory Syncytial Virus NOT DETECTED NOT DETECTED Final   Bordetella pertussis NOT DETECTED NOT DETECTED Final   Bordetella Parapertussis NOT DETECTED NOT DETECTED Final   Chlamydophila pneumoniae NOT DETECTED NOT DETECTED Final   Mycoplasma pneumoniae NOT DETECTED NOT DETECTED Final    Comment: Performed at [facility], [address], [place], [place] [address]  Culture, Respiratory w Gram Stain     Status: None   Collection Time: 07/28/22   1:40 PM   Specimen: Bronchoalveolar Lavage; Respiratory  Result Value Ref Range Status   Specimen Description   Final    BRONCHIAL ALVEOLAR LAVAGE Performed at [facility], [address] [place], [place] [address]    Special Requests   Final    NONE Performed at [facility], [address] [place], [place] [address]    Gram Stain   Final    FEW WBC PRESENT, PREDOMINANTLY MONONUCLEAR NO ORGANISMS SEEN    Culture   Final    RARE Normal respiratory flora-no Staph aureus or Pseudomonas seen Performed at [facility], [address] [place], [place] [address]    Report Status 07/31/2022 FINAL  Final  Acid  Fast Smear (AFB)     Status: None   Collection Time: 07/28/22  1:40 PM   Specimen: Tracheal Aspirate  Result Value Ref Range Status   AFB Specimen Processing Concentration  Final   Acid Fast Smear Negative  Final    Comment: (NOTE) Performed At: [facility] [address] [place], [place] [id number] Jennette Shorter MD Ey:[id number]    Source (AFB) BRONCHIAL ALVEOLAR LAVAGE  Final    Comment: Performed at [facility], [address] [place], [place] [address]  Acid Fast Culture with reflexed sensitivities     Status: None   Collection Time: 07/28/22  1:40 PM   Specimen: Tracheal Aspirate  Result Value Ref Range Status   Acid Fast Culture Negative  Final    Comment: (NOTE) No acid fast bacilli isolated after 6 weeks. Performed At: [facility] [address] [place], [place] [id number] Jennette Shorter MD Ey:[id number]    Source of Sample BRONCHIAL ALVEOLAR LAVAGE  Final    Comment: Performed at [facility], [address] [place], [place] [address]  Fungus Culture With Stain     Status: Abnormal   Collection Time: 07/28/22  1:40 PM   Specimen: Tracheal Aspirate  Result Value Ref Range Status   Fungus Stain Final report  Final   Fungus (Mycology) Culture Final report  (A)  Corrected    Comment: (NOTE) Performed At: [facility] [address] [place], [place] [id number] Jennette Shorter MD Ey:[id number] CORRECTED ON 01/04 AT 1436: PREVIOUSLY REPORTED AS Preliminary report    Fungal Source BRONCHIAL ALVEOLAR LAVAGE  Final    Comment: Performed at [facility], [address], [place], [place] [address]  Pneumocystis smear by DFA     Status: None   Collection Time: 07/28/22  1:40 PM   Specimen: Bronchoalveolar Lavage; Respiratory  Result Value Ref Range Status   Specimen Source-PJSRC BRONCHIAL ALVEOLAR LAVAGE  Final   Pneumocystis jiroveci Ag NEGATIVE  Final    Comment: Performed at [facility] Performed at [facility], [address], [place], [place] [address]   Fungus Culture Result     Status: None   Collection Time: 07/28/22  1:40 PM  Result Value Ref Range Status   Result 1 Comment  Final    Comment: (NOTE) KOH/Calcofluor preparation:  no fungus observed. Performed At: [facility] [address] [place], [place] [id number] Jennette Shorter MD Ey:[id number]   Fungal organism reflex     Status: Abnormal   Collection Time: 07/28/22  1:40 PM  Result Value Ref Range Status   Fungal result 1 Candida albicans (A)  Final    Comment: (NOTE) 1-2 colonies                                            . Performed At: [facility] [address] [place], [place] [id number] Jennette Shorter MD Ey:[id number]     Disposition: Home  Discharge instruction: The patient was instructed to be ambulatory but told to refrain from heavy lifting, strenuous activity, or driving.   Discharge medications:  Allergies as of 06/30/2024       Reactions   Atenolol    Esophageal Spasms   Minocycline Hcl    Migraines and Dizziness   Morphine Sulfate Nausea And Vomiting   Amoxicillin Rash   Did  it involve swelling of the face/tongue/throat, SOB, or low BP? No Did it involve sudden or  severe rash/hives, skin peeling, or any reaction on the inside of your mouth or nose? No Did you need to seek medical attention at a hospital or doctor's office? No When did it last happen?    Childhood allergy   If all above answers are "NO", may proceed with cephalosporin use.   Penicillins Rash   Did it involve swelling of the face/tongue/throat, SOB, or low BP? No Did it involve sudden or severe rash/hives, skin peeling, or any reaction on the inside of your mouth or nose? No Did you need to seek medical attention at a hospital or doctor's office? No When did it last happen?    Childhood allergy   If all above answers are NO, may proceed with cephalosporin use.        Medication List     TAKE these medications    acetaminophen  500 MG tablet Commonly known as: TYLENOL  Take 2 tablets (1,000 mg total) by mouth every 8 (eight) hours for 7 days.   albuterol  108 (90 Base) MCG/ACT inhaler Commonly known as: VENTOLIN  HFA Inhale 2 puffs into the lungs every 4 (four) hours as needed for wheezing or shortness of breath.   amphetamine-dextroamphetamine 20 MG tablet Commonly known as: ADDERALL Take 20 mg by mouth 2 (two) times daily as needed.   Breztri  Aerosphere 160-9-4.8 MCG/ACT Aero inhaler Generic drug: budesonide -glycopyrrolate-formoterol  4 samples   celecoxib  200 MG capsule Commonly known as: CeleBREX  Take 1 capsule (200 mg total) by mouth daily for 14 days.   citalopram  40 MG tablet Commonly known as: CELEXA  Take 40 mg by mouth at bedtime.   estradiol  0.1 MG/24HR patch Commonly known as: VIVELLE -DOT Place 1 patch onto the skin 2 (two) times a week.   levothyroxine  137 MCG tablet Commonly known as: SYNTHROID  Take 1 tablet (137 mcg total) by mouth daily before breakfast. Need annual appointment with labs for further refills   olmesartan -hydrochlorothiazide  20-12.5 MG tablet Commonly known as: BENICAR  HCT Take 1 tablet by mouth daily.   ondansetron  4 MG  tablet Commonly known as: ZOFRAN  Take 1 tablet (4 mg total) by mouth every 6 (six) hours as needed for nausea.   progesterone  100 MG capsule Commonly known as: PROMETRIUM  Take 100 mg by mouth at bedtime.   SUMAtriptan  100 MG tablet Commonly known as: IMITREX  Take 100 mg by mouth every 2 (two) hours as needed for migraine.   traMADol  50 MG tablet Commonly known as: Ultram  Take 1 tablet (50 mg total) by mouth every 6 (six) hours as needed for up to 5 doses.   traZODone 50 MG tablet Commonly known as: DESYREL Take 100 mg by mouth at bedtime.        Followup:   Follow-up Information     Buck Rodena BIRCH, NP Follow up on 07/13/2024.   Specialty: Urology Why: 10:30pm Contact information: [address] [place] [place] [address] [phone]

## 2024-06-30 NOTE — Plan of Care (Signed)

## 2024-06-30 NOTE — Discharge Instructions (Signed)
Activity:  You are encouraged to ambulate frequently (about every hour during waking hours) to help prevent blood clots from forming in your legs or lungs.  However, you should not engage in any heavy lifting (> 10-15 lbs), strenuous activity, or straining. Diet: You should advance your diet as instructed by your physician.  It will be normal to have some bloating, nausea, and abdominal discomfort intermittently. Prescriptions:  You will be provided a prescription for pain medication to take as needed.  If your pain is not severe enough to require the prescription pain medication, you may take extra strength Tylenol instead which will have less side effects.  You should also take a prescribed stool softener to avoid straining with bowel movements as the prescription pain medication may constipate you. Incisions: You may remove your dressing bandages 48 hours after surgery if not removed in the hospital.  You will either have some small staples or special tissue glue at each of the incision sites. Once the bandages are removed (if present), the incisions may stay open to air.  You may start showering (but not soaking or bathing in water) the 2nd day after surgery and the incisions simply need to be patted dry after the shower.  No additional care is needed. What to call us about: You should call the office ([phone]) if you develop fever > 101 or develop persistent vomiting.  

## 2024-07-05 DIAGNOSIS — N8111 Cystocele, midline: Secondary | ICD-10-CM | POA: Diagnosis not present

## 2024-07-05 DIAGNOSIS — R300 Dysuria: Secondary | ICD-10-CM | POA: Diagnosis not present

## 2024-07-07 ENCOUNTER — Other Ambulatory Visit: Payer: Self-pay | Admitting: Endocrinology

## 2024-07-07 DIAGNOSIS — E119 Type 2 diabetes mellitus without complications: Secondary | ICD-10-CM

## 2024-07-13 DIAGNOSIS — N3946 Mixed incontinence: Secondary | ICD-10-CM | POA: Diagnosis not present

## 2024-07-13 DIAGNOSIS — N8111 Cystocele, midline: Secondary | ICD-10-CM | POA: Diagnosis not present

## 2024-07-17 ENCOUNTER — Ambulatory Visit
Admission: RE | Admit: 2024-07-17 | Discharge: 2024-07-17 | Disposition: A | Discharge status: Home / Self Care | Source: Ambulatory Visit | Attending: Endocrinology | Admitting: Endocrinology

## 2024-07-17 DIAGNOSIS — E119 Type 2 diabetes mellitus without complications: Secondary | ICD-10-CM

## 2024-08-07 DIAGNOSIS — R300 Dysuria: Secondary | ICD-10-CM | POA: Diagnosis not present

## 2024-09-04 ENCOUNTER — Telehealth: Payer: Self-pay

## 2024-09-04 NOTE — Telephone Encounter (Signed)
 Called [employer]vmail 09-04-24 to r/s provider in in sx

## 2024-09-05 ENCOUNTER — Institutional Professional Consult (permissible substitution)

## 2024-09-06 ENCOUNTER — Ambulatory Visit (INDEPENDENT_AMBULATORY_CARE_PROVIDER_SITE_OTHER)

## 2024-09-06 VITALS — BP 109/72 | HR 83 | Ht 64.0 in | Wt 189.6 lb

## 2024-09-06 DIAGNOSIS — Z6832 Body mass index (BMI) 32.0-32.9, adult: Secondary | ICD-10-CM

## 2024-09-06 DIAGNOSIS — K458 Other specified abdominal hernia without obstruction or gangrene: Secondary | ICD-10-CM | POA: Diagnosis not present

## 2024-09-06 DIAGNOSIS — E65 Localized adiposity: Secondary | ICD-10-CM

## 2024-09-06 DIAGNOSIS — L987 Excessive and redundant skin and subcutaneous tissue: Secondary | ICD-10-CM

## 2024-09-06 DIAGNOSIS — M6208 Separation of muscle (nontraumatic), other site: Secondary | ICD-10-CM

## 2024-09-06 DIAGNOSIS — R21 Rash and other nonspecific skin eruption: Secondary | ICD-10-CM | POA: Diagnosis not present

## 2024-09-06 NOTE — Progress Notes (Addendum)
 Plastic & Reconstructive Surgery New Patient Visit  Patient: Caitlin Stevenson MRN: [id number] Date: 09/06/2024  Referring Physician: Nichole Senior, MD  Chief Complaint: Excess skin   History of Present Illness:  This is a 74 y.o. female with PMH and PSH as presented below who presents for consultation for body contouring following massive weight loss.  The patient has been taking weight loss medication. Prior, her highest weight was 210 lbs. Since then, she has lost almost 20 lbs. Her weight today is 189.6 lbs and Body mass index is 32.54 kg/m.. With the weight loss she has noted excess skin involving abdomen. Pt complains of rashes in between the skin folds which persist despite antifungal creams and powders for more than 3 months.   No known nutritional issues. No hernias. Other abdominal surgeries as below. Pt is otherwise healthy and does not smoke. Functional status is good. Patient states she was told she has a ventral hernia and was referred to Dr. Ebbie.  Patient is to have a bladder sling surgery and would like to combine both surgeries at the same time, if she were to undergo a panniculectomy versus abdominoplasty.  Past Medical History: Past Medical History:  Diagnosis Date   Allergy    Arthritis    Chronic back pain    Complication of anesthesia    Depression    Diabetes mellitus without complication (HCC)    diet controlled   History of carpal tunnel syndrome    Bilateral   Hypertension    Hypothyroidism    Migraine    Obese    Pneumonia    x2   PONV (postoperative nausea and vomiting)    Psoriasis    Sleep apnea    mouth guard    Past Surgical History: Past Surgical History:  Procedure Laterality Date   ABDOMINAL HYSTERECTOMY     CARPAL TUNNEL RELEASE Bilateral    CERVICAL FUSION     COLONOSCOPY     FRACTURE SURGERY     tibia fracture   ROBOTIC ASSISTED LAPAROSCOPIC SACROCOLPOPEXY N/A 06/29/2024   Procedure: SACROCOLPOPEXY, ROBOT-ASSISTED,  LAPAROSCOPIC;  Surgeon: Cam Morene ORN, MD;  Location: [facility];  Service: Urology;  Laterality: N/A;  ROBOTIC SACROCOLPOPEXY   TOTAL KNEE ARTHROPLASTY Left 04/17/2019   Procedure: TOTAL KNEE ARTHROPLASTY;  Surgeon: Rubie Kemps, MD;  Location: [facility];  Service: Orthopedics;  Laterality: Left;   UPPER GI ENDOSCOPY      Current Medications: Medications Ordered Prior to Encounter[1]  Allergies: Allergies[2]  Family History:  Family history is negative for bleeding/clotting disorders, problems with anesthesia, or connective tissue disorders.   Social History:  Social History   Socioeconomic History   Marital status: Single    Spouse name: Not on file   Number of children: Not on file   Years of education: Not on file   Highest education level: Not on file  Occupational History   Not on file  Tobacco Use   Smoking status: Never   Smokeless tobacco: Never  Vaping Use   Vaping status: Never Used  Substance and Sexual Activity   Alcohol use: No    Alcohol/week: 0.0 standard drinks of alcohol   Drug use: No   Sexual activity: Not on file  Other Topics Concern   Not on file  Social History Narrative   Not on file   Social Drivers of Health   Tobacco Use: Low Risk  (07/25/2024)   Received from [facility]   Patient History  Smoking Tobacco Use: Never    Smokeless Tobacco Use: Never    Passive Exposure: Not on file  Financial Resource Strain: Not on file  Food Insecurity: No Food Insecurity (06/29/2024)   Epic    Worried About Programme Researcher, Broadcasting/film/video in the Last Year: Never true    Ran Out of Food in the Last Year: Never true  Transportation Needs: No Transportation Needs (06/29/2024)   Epic    Lack of Transportation (Medical): No    Lack of Transportation (Non-Medical): No  Physical Activity: Not on file  Stress: Not on file  Social Connections: Unknown (06/29/2024)   Social Connection and Isolation Panel    Frequency of Communication with  Friends and Family: Not on file    Frequency of Social Gatherings with Friends and Family: Not on file    Attends Religious Services: Not on file    Active Member of Clubs or Organizations: Not on file    Attends Banker Meetings: Not on file    Marital Status: Divorced  Depression (PHQ2-9): Not on file  Alcohol Screen: Not on file  Housing: Unknown (07/24/2024)   [facility]   Epic    Unable to Pay for Housing in the Last Year: Not on file    Number of Times Moved in the Last Year: Not on file    At any time in the past 12 months, were you homeless or living in a shelter (including now)?: No  Utilities: Not At Risk (06/29/2024)   Epic    Threatened with loss of utilities: No  Health Literacy: Not on file   Review of systems: 10 point review of systems performed and negative except as noted in the HPI  Physical Exam: BP 109/72 (BP Location: Right Arm, Patient Position: Sitting, Cuff Size: Normal)   Pulse 83   Ht 5' 4 (1.626 m)   Wt 189 lb 9.6 oz (86 kg)   SpO2 95%   BMI 32.54 kg/m  Body mass index is 32.54 kg/m.  Physical Exam           General: Well appearing, no apparent distress. Pulm: Breathing comfortably on room air without sounds/wheezing. CV: Regular rate. Good perfusion of extremities. Chest: Chest wall without abnormality or obvious deformity. No scoliosis, pectus excavatum or pectus carinat Abdomen: Soft, non-tender, no distension. No palpable hernia or bulge. Global lipodystrophy of abdomen with excess skin in both horizontal and vertical dimensions. Skin excess in infraumbilical region with infraumbilical fat deposits.Overhanging pannus to the level of mons. Redundant/ptotic mons tissue.  Excess skin extends circumferentially along posterior trunk. No current intertriginous rashes. Well healed scars present. Unclear upper abdomen diastasis versus hernia Buttock: Loss of adiposity, decreased projection, ptosis.  Lengthening of gluteal fold. Neuro: Moving all four extremities spontaneously. Psych: Appropriate mood and affect.  Assessment: This is a 74 y.o. female with history of weight loss who presents for consultation for body contouring. The patient has maintained a 20 lb weight loss for over 6 months and has excess skin on their abdomen. Based on the patient's exam and discussion of patient's goals today, I believe the patient would be an appropriate candidate for panniculectomy vs. Abdominoplasty.   We discussed the patient's goals and priorities at length. We reviewed that body contouring operations trade better contours at the expense of long, sometimes wide scars. We further discussed the operation(s) in detail including the incision and scar patterns, need for surgical drains and importance of postoperative compression.  We reviewed that massive weight loss patients are at a higher risk for recurrent laxity and wound healing problems including dehiscence and infection. We further discussed the risks of bleeding, seroma, hematoma, asymmetries, standing cutaneous deformities or other contour deformities, asymmetry, skin/nipple/umbilical necrosis, fat necrosis/oil cysts, lymphedema, chronic wounds or sinus tracts, hypertrophic and keloid scarring, nerve injury (ie. LFCN, iliohypogastric, ilioinguinal nerves), numbness, paresthesia and sensory changes, intraabdominal injury. Revision procedures are not uncommon. We also discussed the risk of DVT/PE, fat embolism, heart attack, stroke, death as well as the risks of anesthesia. We reviewed the expected recovery period with no heavy activities or lifting >5lbs for 6 weeks postoperatively.    The patient's BMI today is Body mass index is 32.54 kg/m.SABRA We discussed that patients with BMI>30 are more likely to suffer both minor and major complications following these procedures. The patient verbalized understanding of this concept and is willing to accept these  increased risks.   The patient voiced understanding and wishes to proceed. All questions and concerns were addressed to the patient's apparent satisfaction.  Plan - We will plan for CT abdomen and pelvis with PO contrast to rule out ventral hernia - Plan for panniculectomy versus abdominoplasty with liposuction of flanks and back in combination with bladder sling.  - 5 hours under general anesthesia. - PCP clearance - Will submit for pre-determination with insurance. - Scheduling pending insurance.   The sensitive parts of the examination/procedure were performed with MA as chaperone.  The time documented represents the total time spent on the day of the encounter in preparing for and completing the visit. It does not include time spent by ancillary staff, a resident, a fellow, another trainee, or, for shared visits, time spent jointly with the patient or discussing the case or the performance of other separately performed services.  Time spent: 45 minutes.     Vidyuth Belsito, MD [facility]  09/06/2024 1:35 PM     [1]  Current Outpatient Medications on File Prior to Visit  Medication Sig Dispense Refill   albuterol  (VENTOLIN  HFA) 108 (90 Base) MCG/ACT inhaler Inhale 2 puffs into the lungs every 4 (four) hours as needed for wheezing or shortness of breath. 8.5 g 3   amphetamine-dextroamphetamine (ADDERALL) 20 MG tablet Take 20 mg by mouth 2 (two) times daily as needed.     citalopram  (CELEXA ) 40 MG tablet Take 40 mg by mouth at bedtime.     estradiol  (VIVELLE -DOT) 0.1 MG/24HR patch Place 1 patch onto the skin 2 (two) times a week.     levothyroxine  (SYNTHROID , LEVOTHROID) 137 MCG tablet Take 1 tablet (137 mcg total) by mouth daily before breakfast. Need annual appointment with labs for further refills 30 tablet 0   olmesartan -hydrochlorothiazide  (BENICAR  HCT) 20-12.5 MG tablet Take 1 tablet by mouth daily.     ondansetron  (ZOFRAN ) 4 MG tablet Take 1  tablet (4 mg total) by mouth every 6 (six) hours as needed for nausea. 20 tablet 0   progesterone  (PROMETRIUM ) 100 MG capsule Take 100 mg by mouth at bedtime.     SUMAtriptan  (IMITREX ) 100 MG tablet Take 100 mg by mouth every 2 (two) hours as needed for migraine.     traZODone  (DESYREL ) 50 MG tablet Take 100 mg by mouth at bedtime.     budesonide -glycopyrrolate -formoterol  (BREZTRI  AEROSPHERE) 160-9-4.8 MCG/ACT AERO inhaler 4 samples (Patient not taking: Reported on 09/06/2024)     traMADol  (ULTRAM ) 50 MG tablet Take 1 tablet (50 mg total) by mouth  every 6 (six) hours as needed for up to 5 doses. (Patient not taking: Reported on 09/06/2024) 5 tablet 0   No current facility-administered medications on file prior to visit.  [2]  Allergies Allergen Reactions   Atenolol     Esophageal Spasms   Minocycline Hcl     Migraines and Dizziness   Morphine Sulfate Nausea And Vomiting   Amoxicillin Rash    Did it involve swelling of the face/tongue/throat, SOB, or low BP? No Did it involve sudden or severe rash/hives, skin peeling, or any reaction on the inside of your mouth or nose? No Did you need to seek medical attention at a hospital or doctor's office? No When did it last happen?    Childhood allergy   If all above answers are NO, may proceed with cephalosporin use.    Penicillins Rash    Did it involve swelling of the face/tongue/throat, SOB, or low BP? No Did it involve sudden or severe rash/hives, skin peeling, or any reaction on the inside of your mouth or nose? No Did you need to seek medical attention at a hospital or doctor's office? No When did it last happen?    Childhood allergy   If all above answers are NO, may proceed with cephalosporin use.

## 2024-10-06 ENCOUNTER — Ambulatory Visit

## 2024-10-11 ENCOUNTER — Ambulatory Visit
